# Patient Record
Sex: Female | Born: 1985
Health system: Southern US, Community
[De-identification: ages and names within clinical notes are randomized; demographics above are authoritative.]

## PROBLEM LIST (undated history)

## (undated) DIAGNOSIS — E785 Hyperlipidemia, unspecified: Secondary | ICD-10-CM

## (undated) DIAGNOSIS — R600 Localized edema: Secondary | ICD-10-CM

## (undated) DIAGNOSIS — G43909 Migraine, unspecified, not intractable, without status migrainosus: Secondary | ICD-10-CM

## (undated) DIAGNOSIS — F419 Anxiety disorder, unspecified: Secondary | ICD-10-CM

## (undated) HISTORY — PX: NO PRIOR SURGERIES: 100

## (undated) HISTORY — PX: COLPOSCOPY: SHX161

## (undated) HISTORY — DX: Localized edema: R60.0

## (undated) HISTORY — DX: Hyperlipidemia, unspecified: E78.5

## (undated) HISTORY — DX: Anxiety disorder, unspecified: F41.9

## (undated) HISTORY — DX: Migraine, unspecified, not intractable, without status migrainosus: G43.909

## (undated) DEATH — deceased

---
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
## 2012-11-15 ENCOUNTER — Ambulatory Visit (INDEPENDENT_AMBULATORY_CARE_PROVIDER_SITE_OTHER): Payer: Self-pay | Admitting: Family Medicine

## 2012-11-15 ENCOUNTER — Encounter: Payer: Self-pay | Admitting: Family Medicine

## 2012-11-15 VITALS — BP 120/78 | Temp 98.0°F | Wt 183.0 lb

## 2012-11-15 DIAGNOSIS — G43909 Migraine, unspecified, not intractable, without status migrainosus: Secondary | ICD-10-CM

## 2012-11-15 DIAGNOSIS — Z0000 Encounter for general adult medical examination without abnormal findings: Secondary | ICD-10-CM

## 2012-11-15 MED ORDER — ONDANSETRON 4 MG PO TBDP: 4.0000 mg | ORAL_TABLET | Freq: Four times a day (QID) | ORAL | Status: DC | PRN

## 2012-11-15 MED ORDER — TOPIRAMATE 25 MG PO TABS: 25.0000 mg | ORAL_TABLET | Freq: Every day | ORAL | Status: DC

## 2012-11-15 NOTE — Progress Notes (Signed)
  Subjective:    Patient ID: Alison Davies, female    DOB: 1986-02-01, 27 y.o.   MRN: [id number]  Migraine  This is a recurrent problem. The current episode started in the past 7 days. The problem occurs intermittently. The problem has been rapidly worsening. The pain is located in the right unilateral region. The pain quality is similar to prior headaches. The quality of the pain is described as throbbing. The pain is at a severity of 6/10. The pain is moderate. Associated symptoms include sinus pressure. Pertinent negatives include no blurred vision or coughing. The symptoms are aggravated by emotional stress. She has tried acetaminophen and Excedrin for the symptoms. Her past medical history is significant for migraine headaches.   Headache bad at times .  Not exercising, weekends active   Review of Systems  HENT: Positive for sinus pressure.   Eyes: Negative for blurred vision.  Respiratory: Negative for cough.        Objective:   Physical Exam Alert no acute distress. HEENT normal. Lungs clear. Heart regular rate and rhythm. Neuro exam intact      Assessment & Plan:  Impression migraine headaches. Occurring frequently multiple per week. Patient would like to reintroduce prophylaxis. Imitrex did not help. Excedrin does. Plan initiate Topamax 25 mg each bedtime which was patient's prior dose. Recheck in several months. Diet exercise discussed. Appropriate blood work. WSL

## 2012-11-16 DIAGNOSIS — G43909 Migraine, unspecified, not intractable, without status migrainosus: Secondary | ICD-10-CM | POA: Insufficient documentation

## 2013-02-15 ENCOUNTER — Ambulatory Visit: Payer: Self-pay | Admitting: Family Medicine

## 2014-03-03 ENCOUNTER — Other Ambulatory Visit: Payer: Self-pay | Admitting: Family Medicine

## 2014-03-03 ENCOUNTER — Ambulatory Visit (INDEPENDENT_AMBULATORY_CARE_PROVIDER_SITE_OTHER): Payer: Self-pay | Admitting: Family Medicine

## 2014-03-03 ENCOUNTER — Encounter: Payer: Self-pay | Admitting: Family Medicine

## 2014-03-03 VITALS — BP 128/88 | Ht 65.0 in | Wt 201.0 lb

## 2014-03-03 DIAGNOSIS — Z0000 Encounter for general adult medical examination without abnormal findings: Secondary | ICD-10-CM

## 2014-03-03 DIAGNOSIS — E669 Obesity, unspecified: Secondary | ICD-10-CM

## 2014-03-03 LAB — GLUCOSE, RANDOM: Glucose, Bld: 84 mg/dL (ref 70–99)

## 2014-03-03 LAB — LIPID PANEL
Cholesterol: 240 mg/dL — ABNORMAL HIGH (ref 0–200)
HDL: 47 mg/dL (ref >39)
LDL Cholesterol: 152 mg/dL — ABNORMAL HIGH (ref 0–99)
Total CHOL/HDL Ratio: 5.1 Ratio
Triglycerides: 207 mg/dL — ABNORMAL HIGH (ref <150)
VLDL: 41 mg/dL — ABNORMAL HIGH (ref 0–40)

## 2014-03-03 NOTE — Progress Notes (Signed)
   Subjective:    Patient ID: Alison ChafeDiana M Davies, female    DOB: 09/26/1985, 28 y.o.   MRN: [id number]  HPI Patient is here today for a school physical. She is going [facility] [employer] for [employer]ography.    Pt needs the varicella titer.  Would like to discuss weight loss measures.    Not exercising at all  Diet not so good Working on   Sig obesity in the family    Review of Systems  Constitutional: Negative for activity change, appetite change and fatigue.       Gradual weight gain  HENT: Negative for congestion, ear discharge and rhinorrhea.   Eyes: Negative for discharge.  Respiratory: Negative for cough, chest tightness and wheezing.   Cardiovascular: Negative for chest pain.  Gastrointestinal: Negative for vomiting and abdominal pain.  Genitourinary: Negative for frequency and difficulty urinating.  Musculoskeletal: Negative for neck pain.  Allergic/Immunologic: Negative for environmental allergies and food allergies.  Neurological: Negative for weakness and headaches.  Psychiatric/Behavioral: Negative for behavioral problems and agitation.       Objective:   Physical Exam  Constitutional: She is oriented to person, place, and time. She appears well-developed and well-nourished.  HENT:  Head: Normocephalic.  Right Ear: External ear normal.  Left Ear: External ear normal.  Eyes: Pupils are equal, round, and reactive to light.  Neck: Normal range of motion. No thyromegaly present.  Cardiovascular: Normal rate, regular rhythm, normal heart sounds and intact distal pulses.   No murmur heard. Pulmonary/Chest: Effort normal and breath sounds normal. No respiratory distress. She has no wheezes.  Abdominal: Soft. Bowel sounds are normal. She exhibits no distension and no mass. There is no tenderness.  Musculoskeletal: Normal range of motion. She exhibits no edema or tenderness.  Lymphadenopathy:    She has no cervical adenopathy.  Neurological: She is alert and oriented to person,  place, and time. She exhibits normal muscle tone.  Skin: Skin is warm and dry.  Psychiatric: She has a normal mood and affect. Her behavior is normal.  Vitals reviewed.         Assessment & Plan:  Impression 1 wellness exam #2 obesity discussed plan diet exercise discussed. Varus sella titer. Also recommend lipid and glucose. Diet discussed at length. Exercise also. Form filled out. WS cell

## 2014-03-04 LAB — VARICELLA ZOSTER ANTIBODY, IGG: Varicella IgG: 2864 Index — ABNORMAL HIGH (ref <135.00)

## 2015-12-01 ENCOUNTER — Encounter: Payer: Self-pay | Admitting: Family Medicine

## 2015-12-01 ENCOUNTER — Ambulatory Visit (INDEPENDENT_AMBULATORY_CARE_PROVIDER_SITE_OTHER): Payer: Self-pay | Admitting: Family Medicine

## 2015-12-01 VITALS — BP 128/90 | Temp 98.8°F | Ht 64.0 in | Wt 204.0 lb

## 2015-12-01 DIAGNOSIS — G43009 Migraine without aura, not intractable, without status migrainosus: Secondary | ICD-10-CM

## 2015-12-01 MED ORDER — PROMETHAZINE HCL 25 MG PO TABS: 25.0000 mg | ORAL_TABLET | Freq: Four times a day (QID) | ORAL | 2 refills | Status: DC | PRN

## 2015-12-01 MED ORDER — TOPIRAMATE 50 MG PO TABS: ORAL_TABLET | ORAL | 5 refills | Status: DC

## 2015-12-01 NOTE — Progress Notes (Signed)
   Subjective:    Patient ID: Alison Davies, female    DOB: 24-Feb-1986, 30 y.o.   MRN: [id number]  Headache   This is a new problem. Episode onset: one month. The pain is located in the right unilateral region. The pain is at a severity of 7/10. Associated symptoms include blurred vision, photophobia, scalp tenderness and vomiting. Treatments tried: excedrin migraine. The treatment provided moderate relief.   Pt had rx for topamax but it ran out. Would like to get back on topamax.   Graduating from school this dec  Working as a cna   h a's not as severe in the as in the pas  Photo and phono phobia  Starts as a small type dull pain, then gets worse  exced hjelps someusing topical agents dark room   Having them at work would like to get bk on preventitive agent   Review of Systems  Eyes: Positive for blurred vision and photophobia.  Gastrointestinal: Positive for vomiting.  Neurological: Positive for headaches.       Objective:   Physical Exam Alert vitals stable, NAD. Blood pressure good on repeat. HEENT normal. Lungs clear. Heart regular rate and rhythm.        Assessment & Plan:  Impression recurrence of migraine headaches. Time to reinitiate prophylaxis. Experiencing 5 or 6 per month. Plan initiate Topamax. Phenergan when necessary for nausea WSL

## 2016-05-02 DIAGNOSIS — R87619 Unspecified abnormal cytological findings in specimens from cervix uteri: Secondary | ICD-10-CM

## 2016-05-02 HISTORY — DX: Unspecified abnormal cytological findings in specimens from cervix uteri: R87.619

## 2016-07-13 ENCOUNTER — Ambulatory Visit (INDEPENDENT_AMBULATORY_CARE_PROVIDER_SITE_OTHER): Payer: Self-pay | Admitting: Family Medicine

## 2016-07-13 ENCOUNTER — Encounter: Payer: Self-pay | Admitting: Family Medicine

## 2016-07-13 VITALS — BP 112/74 | Ht 64.0 in | Wt 200.8 lb

## 2016-07-13 DIAGNOSIS — F411 Generalized anxiety disorder: Secondary | ICD-10-CM

## 2016-07-13 MED ORDER — SERTRALINE HCL 50 MG PO TABS: 50.0000 mg | ORAL_TABLET | Freq: Every day | ORAL | 5 refills | Status: DC

## 2016-07-13 NOTE — Progress Notes (Signed)
   Subjective:    Patient ID: Alison Davies, female    DOB: 11/12/1985, 31 y.o.   MRN: [id number]  Anxiety  Presents for initial visit. Symptoms include depressed mood, excessive worry, insomnia and panic. Primary symptoms comment: panic attacks when driving on highway or in social situations.   Treatments tried: zoloft 50mg .   zoloft is helping but still having trouble sleeping. Since starting zoloft has noticed hands shaky off and on.   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        Objective:   Physical Exam  Alert vitals stable, NAD. Blood pressure good on repeat. HEENT normal. Lungs clear. Heart regular rate and rhythm.[email or web address]       Assessment & Plan:  Impression generalized anxiety disorder with elements of insomnia. Maintain Zoloft. Overall handling well exercise encouraged. Initiate melatonin recheck in 6 months

## 2016-12-22 ENCOUNTER — Other Ambulatory Visit: Payer: Self-pay

## 2016-12-22 MED ORDER — TOPIRAMATE 50 MG PO TABS: ORAL_TABLET | ORAL | 3 refills | Status: DC

## 2016-12-22 NOTE — Progress Notes (Unsigned)
Last 07/13/16 for anxiety

## 2016-12-22 NOTE — Progress Notes (Signed)
Ok may ref for three mop total

## 2017-01-13 ENCOUNTER — Ambulatory Visit (INDEPENDENT_AMBULATORY_CARE_PROVIDER_SITE_OTHER): Payer: Self-pay | Admitting: Family Medicine

## 2017-01-13 ENCOUNTER — Encounter: Payer: Self-pay | Admitting: Family Medicine

## 2017-01-13 VITALS — BP 132/84 | Ht 64.0 in | Wt 184.2 lb

## 2017-01-13 DIAGNOSIS — G43009 Migraine without aura, not intractable, without status migrainosus: Secondary | ICD-10-CM

## 2017-01-13 DIAGNOSIS — E669 Obesity, unspecified: Secondary | ICD-10-CM

## 2017-01-13 DIAGNOSIS — F411 Generalized anxiety disorder: Secondary | ICD-10-CM

## 2017-01-13 MED ORDER — SERTRALINE HCL 50 MG PO TABS: 50.0000 mg | ORAL_TABLET | Freq: Every day | ORAL | 5 refills | Status: DC

## 2017-01-13 MED ORDER — TOPIRAMATE 50 MG PO TABS: ORAL_TABLET | ORAL | 3 refills | Status: DC

## 2017-01-13 NOTE — Progress Notes (Signed)
   Subjective:    Patient ID: Alison Davies, female    DOB: February 07, 1986, 31 y.o.   MRN: [id number]  Anxiety  Presents for follow-up visit.     Patient would also like to discuss weight loss.   Patient notes ongoing compliance with antidepressant medication. No obvious side effects. Reports does not miss a dose. Overall continues to help depression substantially. No thoughts of homicide or suicide. Would like to maintain medication.  Also anxiety issues are much better  .headaches, still take s topamax  exdrising ome gym and liitng weights three times per wk   On odd hr shifts still having is sleeping, takes melatonin prn   woring crazy shits an having trouble seeping with it     Review of Systems No headache, no major weight loss or weight gain, no chest pain no back pain abdominal pain no change in bowel habits complete ROS otherwise negative     Objective:   Physical Exam  Alert vitals stable, NAD. Blood pressure good on repeat. HEENT normal. Lungs clear. Heart regular rate and rhythm.       Assessment & Plan:  Impression 1 /anxiety with primarily anxiety good control on current medication. #2 migraine headaches considerably improved non-for a whole year. Compliant with prophylactic medicine. #3 obesity multiple questions answered regarding diet plan refilled generic Zoloft. Attempt to wean off Topamax. Diet exercise discuss follow-up in 6 months

## 2017-01-23 ENCOUNTER — Other Ambulatory Visit: Payer: Self-pay | Admitting: Family Medicine

## 2017-05-18 MED FILL — SERTRALINE HCL 50 MG TABLET: 50 | 30 days supply | Qty: 30 | Fill #0

## 2017-06-02 ENCOUNTER — Ambulatory Visit (INDEPENDENT_AMBULATORY_CARE_PROVIDER_SITE_OTHER): Payer: 59 | Admitting: Family Medicine

## 2017-06-02 ENCOUNTER — Encounter: Payer: Self-pay | Admitting: Family Medicine

## 2017-06-02 VITALS — BP 122/84 | Ht 64.5 in | Wt 197.4 lb

## 2017-06-02 DIAGNOSIS — Z13220 Encounter for screening for lipoid disorders: Secondary | ICD-10-CM

## 2017-06-02 DIAGNOSIS — R5383 Other fatigue: Secondary | ICD-10-CM | POA: Diagnosis not present

## 2017-06-02 DIAGNOSIS — Z79899 Other long term (current) drug therapy: Secondary | ICD-10-CM

## 2017-06-02 DIAGNOSIS — Z0000 Encounter for general adult medical examination without abnormal findings: Secondary | ICD-10-CM | POA: Diagnosis not present

## 2017-06-02 MED ORDER — SERTRALINE HCL 50 MG PO TABS: 50.0000 mg | ORAL_TABLET | Freq: Every day | ORAL | 5 refills | Status: DC

## 2017-06-02 NOTE — Progress Notes (Signed)
   Subjective:    Patient ID: Alison Davies, female    DOB: 11/03/1985, 32 y.o.   MRN: [id number]  HPI  Pt here for physical. Pt states she does not want a pap at this time. Eating healthy and exercising. Stress eats.   Would like to talk about referral to Dr. Dalbert GarnetBeasley.does weight msnagement,   Often stress eat and emotionl eating and feels motivated to turn around  Pt has started to eerise, now full time, and the stress has taken away exercise  Diet so so five out of ten   No recent blood ork done    Takes zoloft, states helps the anxiety still     Uses generic xanax on occasion one or twice per moth  migr headaches overall still good despite taking the topamax off   Working at [employer] [place] [employer]   Review of Systems  Constitutional: Negative for activity change, appetite change and fatigue.  HENT: Negative for congestion and rhinorrhea.   Eyes: Negative for discharge.  Respiratory: Negative for cough, chest tightness and wheezing.   Cardiovascular: Negative for chest pain.  Gastrointestinal: Negative for abdominal pain, blood in stool and vomiting.  Endocrine: Negative for polyphagia.  Genitourinary: Negative for difficulty urinating and frequency.  Musculoskeletal: Negative for neck pain.  Skin: Negative for color change.  Allergic/Immunologic: Negative for environmental allergies and food allergies.  Neurological: Negative for weakness and headaches.  Psychiatric/Behavioral: Negative for agitation and behavioral problems.  All other systems reviewed and are negative.      Objective:   Physical Exam  Constitutional: She is oriented to person, place, and time. She appears well-developed and well-nourished.  HENT:  Head: Normocephalic and atraumatic.  Right Ear: External ear normal.  Left Ear: External ear normal.  Eyes: Right eye exhibits no discharge. Left eye exhibits no discharge.  Neck: Normal range of motion. No tracheal deviation present.   Cardiovascular: Normal rate, regular rhythm, normal heart sounds and intact distal pulses. Exam reveals no gallop.  No murmur heard. Pulmonary/Chest: Effort normal and breath sounds normal. No stridor. No respiratory distress. She has no wheezes. She has no rales.  Abdominal: Soft. Bowel sounds are normal. She exhibits no distension and no mass. There is no tenderness. There is no rebound and no guarding.  Musculoskeletal: Normal range of motion. She exhibits no edema or tenderness.  Lymphadenopathy:    She has no cervical adenopathy.  Neurological: She is alert and oriented to person, place, and time. She exhibits normal muscle tone.  Skin: Skin is warm and dry.  Psychiatric: She has a normal mood and affect. Her behavior is normal.  Vitals reviewed.         Assessment & Plan:  Impression well adult exam.  Diet discussed.  Exercise discussed.  Appropriate blood work discussed in order.  Patient sees GYN regularly for preventive checkups.  Patient is obese BMI of 33.  She would like to be referred to a [place] program for weight under Dr. Dalbert GarnetBeasley further recommendations based on blood work we will work on referral

## 2017-06-03 LAB — HEPATIC FUNCTION PANEL
ALT: 17 IU/L (ref 0–32)
AST: 14 IU/L (ref 0–40)
Albumin: 4.5 g/dL (ref 3.5–5.5)
Alkaline Phosphatase: 66 IU/L (ref 39–117)
Bilirubin Total: 0.4 mg/dL (ref 0.0–1.2)
Bilirubin, Direct: 0.12 mg/dL (ref 0.00–0.40)
Total Protein: 7.3 g/dL (ref 6.0–8.5)

## 2017-06-03 LAB — BASIC METABOLIC PANEL
BUN/Creatinine Ratio: 18 (ref 9–23)
BUN: 11 mg/dL (ref 6–20)
CO2: 21 mmol/L (ref 20–29)
Calcium: 9.6 mg/dL (ref 8.7–10.2)
Chloride: 101 mmol/L (ref 96–106)
Creatinine, Ser: 0.62 mg/dL (ref 0.57–1.00)
GFR calc Af Amer: 140 mL/min/{1.73_m2} (ref >59)
GFR calc non Af Amer: 121 mL/min/{1.73_m2} (ref >59)
Glucose: 99 mg/dL (ref 65–99)
Potassium: 4.4 mmol/L (ref 3.5–5.2)
Sodium: 138 mmol/L (ref 134–144)

## 2017-06-03 LAB — CBC WITH DIFFERENTIAL/PLATELET
Basophils Absolute: 0 10*3/uL (ref 0.0–0.2)
Basos: 0 %
EOS (ABSOLUTE): 0.2 10*3/uL (ref 0.0–0.4)
Eos: 2 %
Hematocrit: 44.3 % (ref 34.0–46.6)
Hemoglobin: 14.5 g/dL (ref 11.1–15.9)
Immature Grans (Abs): 0 10*3/uL (ref 0.0–0.1)
Immature Granulocytes: 0 %
Lymphocytes Absolute: 2.4 10*3/uL (ref 0.7–3.1)
Lymphs: 30 %
MCH: 30.3 pg (ref 26.6–33.0)
MCHC: 32.7 g/dL (ref 31.5–35.7)
MCV: 93 fL (ref 79–97)
Monocytes Absolute: 0.4 10*3/uL (ref 0.1–0.9)
Monocytes: 5 %
Neutrophils Absolute: 5.2 10*3/uL (ref 1.4–7.0)
Neutrophils: 63 %
Platelets: 323 10*3/uL (ref 150–379)
RBC: 4.78 x10E6/uL (ref 3.77–5.28)
RDW: 12.3 % (ref 12.3–15.4)
WBC: 8.2 10*3/uL (ref 3.4–10.8)

## 2017-06-03 LAB — LIPID PANEL
Chol/HDL Ratio: 5 ratio — ABNORMAL HIGH (ref 0.0–4.4)
Cholesterol, Total: 248 mg/dL — ABNORMAL HIGH (ref 100–199)
HDL: 50 mg/dL (ref >39)
LDL Calculated: 169 mg/dL — ABNORMAL HIGH (ref 0–99)
Triglycerides: 144 mg/dL (ref 0–149)
VLDL Cholesterol Cal: 29 mg/dL (ref 5–40)

## 2017-06-05 ENCOUNTER — Encounter: Payer: Self-pay | Admitting: Family Medicine

## 2017-06-08 ENCOUNTER — Encounter: Payer: Self-pay | Admitting: Family Medicine

## 2017-06-15 MED FILL — SERTRALINE HCL 50 MG TABLET: 50 | 30 days supply | Qty: 30 | Fill #1

## 2017-07-20 ENCOUNTER — Encounter (INDEPENDENT_AMBULATORY_CARE_PROVIDER_SITE_OTHER): Payer: 59

## 2017-07-21 ENCOUNTER — Encounter (INDEPENDENT_AMBULATORY_CARE_PROVIDER_SITE_OTHER): Payer: Self-pay | Admitting: Family Medicine

## 2017-07-26 ENCOUNTER — Encounter (INDEPENDENT_AMBULATORY_CARE_PROVIDER_SITE_OTHER): Payer: Self-pay | Admitting: Family Medicine

## 2017-07-26 ENCOUNTER — Ambulatory Visit (INDEPENDENT_AMBULATORY_CARE_PROVIDER_SITE_OTHER): Payer: 59 | Admitting: Family Medicine

## 2017-07-26 VITALS — BP 123/85 | HR 68 | Temp 98.0°F | Ht 63.0 in | Wt 199.0 lb

## 2017-07-26 DIAGNOSIS — E7849 Other hyperlipidemia: Secondary | ICD-10-CM

## 2017-07-26 DIAGNOSIS — R0600 Dyspnea, unspecified: Secondary | ICD-10-CM

## 2017-07-26 DIAGNOSIS — Z0289 Encounter for other administrative examinations: Secondary | ICD-10-CM

## 2017-07-26 DIAGNOSIS — Z9189 Other specified personal risk factors, not elsewhere classified: Secondary | ICD-10-CM

## 2017-07-26 DIAGNOSIS — R5383 Other fatigue: Secondary | ICD-10-CM

## 2017-07-26 DIAGNOSIS — Z6835 Body mass index (BMI) 35.0-35.9, adult: Secondary | ICD-10-CM

## 2017-07-26 DIAGNOSIS — Z1331 Encounter for screening for depression: Secondary | ICD-10-CM | POA: Diagnosis not present

## 2017-07-26 DIAGNOSIS — R0609 Other forms of dyspnea: Secondary | ICD-10-CM

## 2017-07-26 NOTE — Progress Notes (Signed)
.  Office: [phone]  /  Fax: [phone]   HPI:   Chief Complaint: OBESITY  Alison Davies (MR# [id number]) is a 32 y.o. female who presents on 07/26/2017 for obesity evaluation and treatment. Current BMI is Body mass index is 35.25 kg/m.Alison Davies has struggled with obesity for years and has been unsuccessful in either losing weight or maintaining long term weight loss. Alison Davies attended our information session and states she is currently in the action stage of change and ready to dedicate time achieving and maintaining a healthier weight.  Alison Davies was told by co-workers about our clinic.   Alison Davies states her family eats meals together she struggles with family and or coworkers weight loss sabotage her desired weight loss is 59 lbs she started gaining weight 1st year of college her heaviest weight ever was 210 lbs she has significant food cravings issues  she wakes up frquently in the middle of the night to eat she skips meals frequently she is frequently drinking liquids with calories she frequently makes poor food choices she frequently eats larger portions than normal  she struggles with emotional eating    Fatigue Alison Davies feels her energy is lower than it should be. This has worsened with weight gain and has not worsened recently. Alison Davies admits to daytime somnolence and  admits to waking up still tired. Patient is at risk for obstructive sleep apnea. Patent has a history of symptoms of daytime fatigue. Patient generally gets 5 hours of sleep per night, and states they generally have nightime awakenings. Snoring is present. Apneic episodes are not present. Epworth Sleepiness Score is 5.  Dyspnea on exertion Alison Davies notes increasing shortness of breath with exercising and seems to be worsening over time with weight gain. She notes getting out of breath sooner with activity than she used to. This has not gotten worse recently. EKG with significant artifact (appears normal otherwise). Makinsley  denies orthopnea.  Hyperlipidemia Alison Davies has hyperlipidemia and has been trying to improve her cholesterol levels with intensive lifestyle modification including a low saturated fat diet, exercise and weight loss. Her LDL was elevated at last drawn. She was doing Keto diet previously. She denies any chest pain, claudication or myalgias.  At risk for cardiovascular disease Alison Davies is at a higher than average risk for cardiovascular disease due to obesity and hyperlipidemia. She currently denies any chest pain.  Depression Screen Alison Davies's Food and Mood (modified PHQ-9) score was  Depression screen PHQ 2/9 07/26/2017  Decreased Interest 1  Down, Depressed, Hopeless 2  PHQ - 2 Score 3  Altered sleeping 2  Tired, decreased energy 3  Change in appetite 3  Feeling bad or failure about yourself  3  Trouble concentrating 3  Moving slowly or fidgety/restless 0  Suicidal thoughts 0  PHQ-9 Score 17  Difficult doing work/chores Somewhat difficult    ALLERGIES: No Known Allergies  MEDICATIONS: Current Outpatient Medications on File Prior to Visit  Medication Sig Dispense Refill  . ALPRAZolam (XANAX) 0.25 MG tablet Take 0.25 mg by mouth as needed for anxiety.    . Biotin 3 MG TABS Take 2 tablets by mouth daily.    . Cyanocobalamin (VITAMIN B-12 CR) 1500 MCG TBCR Take 2 tablets by mouth 2 (two) times daily.    . Magnesium Citrate 100 MG TABS Take 1 capsule by mouth daily.    . Melatonin 5 MG CHEW Chew 2 capsules by mouth at bedtime as needed.    . Multiple Vitamin (MULTIVITAMIN WITH MINERALS) TABS tablet  Take 1 tablet by mouth daily.    . Norethindrone (LYZA PO) Take by mouth daily.    . Omega-3 Fatty Acids (OMEGA-3 FISH OIL) 500 MG CAPS Take 1 capsule by mouth daily.    . sertraline (ZOLOFT) 50 MG tablet Take 1 tablet (50 mg total) by mouth daily. 30 tablet 5  . vitamin C (ASCORBIC ACID) 250 MG tablet Take 250 mg by mouth daily.    . Zinc 50 MG CAPS Take 1 capsule by mouth daily.     No  current facility-administered medications on file prior to visit.     PAST MEDICAL HISTORY: Past Medical History:  Diagnosis Date  . Anxiety   . Hyperlipidemia   . Leg edema   . Migraines     PAST SURGICAL HISTORY: History reviewed. No pertinent surgical history.  SOCIAL HISTORY: Social History   Tobacco Use  . Smoking status: Never Smoker  . Smokeless tobacco: Never Used  Substance Use Topics  . Alcohol use: Not on file  . Drug use: Not on file    FAMILY HISTORY: Family History  Problem Relation Age of Onset  . Anxiety disorder Mother   . Diabetes Father   . Obesity Father     ROS: Review of Systems  Constitutional: Positive for malaise/fatigue. Negative for weight loss.       + Trouble sleeping  Eyes:       + Wear glasses or contacts + Floaters  Respiratory: Positive for shortness of breath (exertion).   Cardiovascular: Negative for chest pain, orthopnea and claudication.       + Very cold feet or hands (occasionally)  Musculoskeletal: Negative for myalgias.  Skin:       + Hair or nail changes (hair loss)  Psychiatric/Behavioral: Positive for depression. Negative for suicidal ideas. The patient is nervous/anxious.        + Stress    PHYSICAL EXAM: Blood pressure 123/85, pulse 68, temperature 98 F (36.7 C), temperature source Oral, height 5\' 3"  (1.6 m), weight 199 lb (90.3 kg), last menstrual period 05/31/2017, SpO2 98 %. Body mass index is 35.25 kg/m. Physical Exam  Constitutional: She is oriented to person, place, and time. She appears well-developed and well-nourished.  HENT:  Head: Normocephalic and atraumatic.  Nose: Nose normal.  Eyes: EOM are normal. No scleral icterus.  Neck: Normal range of motion. Neck supple. No thyromegaly present.  Cardiovascular: Normal rate and regular rhythm.  Pulmonary/Chest: Effort normal. No respiratory distress.  Abdominal: Soft. There is no tenderness.  + Obesity  Musculoskeletal:  Range of Motion normal in  all 4 extremities Trace edema noted in bilateral lower extremities  Neurological: She is alert and oriented to person, place, and time. Coordination normal.  Skin: Skin is warm and dry.  Psychiatric: She has a normal mood and affect. Her behavior is normal.  Vitals reviewed.   RECENT LABS AND TESTS: BMET    Component Value Date/Time   NA 138 06/02/2017 0945   K 4.4 06/02/2017 0945   CL 101 06/02/2017 0945   CO2 21 06/02/2017 0945   GLUCOSE 99 06/02/2017 0945   GLUCOSE 84 03/03/2014 1208   BUN 11 06/02/2017 0945   CREATININE 0.62 06/02/2017 0945   CALCIUM 9.6 06/02/2017 0945   GFRNONAA 121 06/02/2017 0945   GFRAA 140 06/02/2017 0945   No results found for: HGBA1C No results found for: INSULIN CBC    Component Value Date/Time   WBC 8.2 06/02/2017 0945   RBC 4.78 06/02/2017  0945   HGB 14.5 06/02/2017 0945   HCT 44.3 06/02/2017 0945   PLT 323 06/02/2017 0945   MCV 93 06/02/2017 0945   MCH 30.3 06/02/2017 0945   MCHC 32.7 06/02/2017 0945   RDW 12.3 06/02/2017 0945   LYMPHSABS 2.4 06/02/2017 0945   EOSABS 0.2 06/02/2017 0945   BASOSABS 0.0 06/02/2017 0945   Iron/TIBC/Ferritin/ %Sat No results found for: IRON, TIBC, FERRITIN, IRONPCTSAT Lipid Panel     Component Value Date/Time   CHOL 248 (H) 06/02/2017 0945   TRIG 144 06/02/2017 0945   HDL 50 06/02/2017 0945   CHOLHDL 5.0 (H) 06/02/2017 0945   CHOLHDL 5.1 03/03/2014 1208   VLDL 41 (H) 03/03/2014 1208   LDLCALC 169 (H) 06/02/2017 0945   Hepatic Function Panel     Component Value Date/Time   PROT 7.3 06/02/2017 0945   ALBUMIN 4.5 06/02/2017 0945   AST 14 06/02/2017 0945   ALT 17 06/02/2017 0945   ALKPHOS 66 06/02/2017 0945   BILITOT 0.4 06/02/2017 0945   BILIDIR 0.12 06/02/2017 0945   No results found for: TSH Vitamin D No recent labs  ECG  shows NSR with a rate of 69 BPM INDIRECT CALORIMETER done today shows a VO2 of 279 and a REE of 1943. Her calculated basal metabolic rate is 9147 thus her basal  metabolic rate is better than expected.    ASSESSMENT AND PLAN: Other fatigue - Plan: EKG 12-Lead, Hemoglobin A1c, Insulin, random, Vitamin B12, VITAMIN D 25 Hydroxy (Vit-D Deficiency, Fractures), Folate, T3, T4, free, TSH  Dyspnea on exertion  Other hyperlipidemia - Plan: Lipid Panel With LDL/HDL Ratio  Depression screening  At risk for heart disease  Class 2 severe obesity with serious comorbidity and body mass index (BMI) of 35.0 to 35.9 in adult, unspecified obesity type (HCC)  PLAN:  Fatigue Walda was informed that her fatigue may be related to obesity, depression or many other causes. Labs will be ordered, and in the meanwhile Amanie has agreed to work on diet, exercise and weight loss to help with fatigue. Proper sleep hygiene was discussed including the need for 7-8 hours of quality sleep each night. A sleep study was not ordered based on symptoms and Epworth score.  Dyspnea on exertion Sadye's shortness of breath appears to be obesity related and exercise induced. She has agreed to work on weight loss and gradually increase exercise to treat her exercise induced shortness of breath. If Sapphire follows our instructions and loses weight without improvement of her shortness of breath, we will plan to refer to pulmonology. We will monitor this condition regularly. Ramonica agrees to this plan.  Hyperlipidemia Jewelene was informed of the American Heart Association Guidelines emphasizing intensive lifestyle modifications as the first line treatment for hyperlipidemia. We discussed many lifestyle modifications today in depth, and Savvy will continue to work on decreasing saturated fats such as fatty red meat, butter and many fried foods. She will also increase vegetables and lean protein in her diet and continue to work on exercise and weight loss efforts. We will check labs and Havannah agrees to follow up with our clinic in 2 weeks.  Cardiovascular risk counselling Torrie was given extended (15  minutes) coronary artery disease prevention counseling today. She is 32 y.o. female and has risk factors for heart disease including obesity and hyperlipidemia. We discussed intensive lifestyle modifications today with an emphasis on specific weight loss instructions and strategies. Pt was also informed of the importance of increasing exercise and decreasing  saturated fats to help prevent heart disease.  Depression Screen Romana had a strongly positive depression screening. Depression is commonly associated with obesity and often results in emotional eating behaviors. We will monitor this closely and work on CBT to help improve the non-hunger eating patterns. Referral to Psychology may be required if no improvement is seen as she continues in our clinic.  Obesity Stesha is currently in the action stage of change and her goal is to continue with weight loss efforts She has agreed to follow the Category 3 plan Kizzy has been instructed to work up to a goal of 150 minutes of combined cardio and strengthening exercise per week for weight loss and overall health benefits. We discussed the following Behavioral Modification Strategies today: increasing lean protein intake, decrease eating out, work on meal planning and easy cooking plans, and increase H20 intake  Jeani has agreed to follow up with our clinic in 2 weeks. She was informed of the importance of frequent follow up visits to maximize her success with intensive lifestyle modifications for her multiple health conditions. She was informed we would discuss her lab results at her next visit unless there is a critical issue that needs to be addressed sooner. Ayala agreed to keep her next visit at the agreed upon time to discuss these results.    OBESITY BEHAVIORAL INTERVENTION VISIT  Today's visit was # 1 out of 22.  Starting weight: 199 lbs Starting date: 07/26/17 Today's weight : 199 lbs  Today's date: 07/26/2017 Total lbs lost to date:  0 (Patients must lose 7 lbs in the first 6 months to continue with counseling)   ASK: We discussed the diagnosis of obesity with Arva Chafe today and Erlene agreed to give [name] permission to discuss obesity behavioral modification therapy today.  ASSESS: Keyunna has the diagnosis of obesity and her BMI today is 35.26 Doreene is in the action stage of change   ADVISE: Tyarra was educated on the multiple health risks of obesity as well as the benefit of weight loss to improve her health. She was advised of the need for long term treatment and the importance of lifestyle modifications.  AGREE: Multiple dietary modification options and treatment options were discussed and  Naiomy agreed to the above obesity treatment plan.   I, Burt Knack, am acting as transcriptionist for Debbra Riding, MD   I have reviewed the above documentation for accuracy and completeness, and I agree with the above. - Debbra Riding, MD

## 2017-07-27 LAB — TSH: TSH: 1.85 u[IU]/mL (ref 0.450–4.500)

## 2017-07-27 LAB — LIPID PANEL WITH LDL/HDL RATIO
Cholesterol, Total: 250 mg/dL — ABNORMAL HIGH (ref 100–199)
HDL: 55 mg/dL (ref >39)
LDL Calculated: 163 mg/dL — ABNORMAL HIGH (ref 0–99)
LDl/HDL Ratio: 3 ratio (ref 0.0–3.2)
Triglycerides: 158 mg/dL — ABNORMAL HIGH (ref 0–149)
VLDL Cholesterol Cal: 32 mg/dL (ref 5–40)

## 2017-07-27 LAB — HEMOGLOBIN A1C
Est. average glucose Bld gHb Est-mCnc: 100 mg/dL
Hgb A1c MFr Bld: 5.1 % (ref 4.8–5.6)

## 2017-07-27 LAB — VITAMIN B12: Vitamin B-12: 985 pg/mL (ref 232–1245)

## 2017-07-27 LAB — VITAMIN D 25 HYDROXY (VIT D DEFICIENCY, FRACTURES): Vit D, 25-Hydroxy: 14.7 ng/mL — ABNORMAL LOW (ref 30.0–100.0)

## 2017-07-27 LAB — T4, FREE: Free T4: 1.26 ng/dL (ref 0.82–1.77)

## 2017-07-27 LAB — INSULIN, RANDOM: INSULIN: 14.6 u[IU]/mL (ref 2.6–24.9)

## 2017-07-27 LAB — T3: T3, Total: 111 ng/dL (ref 71–180)

## 2017-07-27 LAB — FOLATE: Folate: >20 ng/mL (ref >3.0)

## 2017-07-27 MED FILL — SERTRALINE HCL 50 MG TABLET: 50 | 30 days supply | Qty: 30 | Fill #0

## 2017-08-03 ENCOUNTER — Encounter (INDEPENDENT_AMBULATORY_CARE_PROVIDER_SITE_OTHER): Payer: Self-pay | Admitting: Family Medicine

## 2017-08-09 ENCOUNTER — Ambulatory Visit (INDEPENDENT_AMBULATORY_CARE_PROVIDER_SITE_OTHER): Payer: 59 | Admitting: Family Medicine

## 2017-08-09 VITALS — BP 101/59 | HR 83 | Temp 98.5°F | Ht 63.0 in | Wt 195.0 lb

## 2017-08-09 DIAGNOSIS — Z6834 Body mass index (BMI) 34.0-34.9, adult: Secondary | ICD-10-CM | POA: Diagnosis not present

## 2017-08-09 DIAGNOSIS — Z9189 Other specified personal risk factors, not elsewhere classified: Secondary | ICD-10-CM | POA: Diagnosis not present

## 2017-08-09 DIAGNOSIS — E8881 Metabolic syndrome: Secondary | ICD-10-CM | POA: Diagnosis not present

## 2017-08-09 DIAGNOSIS — E7849 Other hyperlipidemia: Secondary | ICD-10-CM | POA: Diagnosis not present

## 2017-08-09 DIAGNOSIS — E669 Obesity, unspecified: Secondary | ICD-10-CM

## 2017-08-09 DIAGNOSIS — E559 Vitamin D deficiency, unspecified: Secondary | ICD-10-CM | POA: Diagnosis not present

## 2017-08-09 MED ORDER — VITAMIN D (ERGOCALCIFEROL) 1.25 MG (50000 UNIT) PO CAPS: 50000.0000 [IU] | ORAL_CAPSULE | ORAL | 0 refills | Status: DC

## 2017-08-09 MED FILL — VIT D2 1.25 MG (50,000 UNIT: 1.25 MG | 28 days supply | Qty: 4 | Fill #0

## 2017-08-09 NOTE — Progress Notes (Signed)
Office: [phone]  /  Fax: [phone]   HPI:   Chief Complaint: OBESITY Alison Davies is here to discuss her progress with her obesity treatment plan. She is on the Category 3 plan and is following her eating plan approximately 75 % of the time. She states she is walking for 30 minutes 5 times per week. Alison Davies indulged over the weekend. No hunger except when skipping snacks. Talked with family about having to prepare her own meals.  Her weight is 195 lb (88.5 kg) today and has had a weight loss of 4 pounds over a period of 2 weeks since her last visit. She has lost 4 lbs since starting treatment with [name].  Vitamin D Deficiency Alison Davies has a diagnosis of vitamin D deficiency. She is not on Vit D supplement currently, Vit D level of 14.7 and denies nausea, vomiting or muscle weakness.  At risk for osteopenia and osteoporosis Alison Davies is at higher risk of osteopenia and osteoporosis due to vitamin D deficiency.   Hyperlipidemia Alison Davies has hyperlipidemia and has been trying to improve her cholesterol levels with intensive lifestyle modification including a low saturated fat diet, exercise and weight loss. LDL of 163 and triglycerides of 158. She denies any chest pain, claudication or myalgias.  Insulin Resistance Alison Davies has a diagnosis of insulin resistance based on her elevated fasting insulin level >5. Hgb A1c of 5.1 and insulin of 14.6. Although Alison Davies's blood glucose readings are still under good control, insulin resistance puts her at greater risk of metabolic syndrome and diabetes. She is not taking metformin currently and continues to work on diet and exercise to decrease risk of diabetes.  ALLERGIES: No Known Allergies  MEDICATIONS: Current Outpatient Medications on File Prior to Visit  Medication Sig Dispense Refill  . ALPRAZolam (XANAX) 0.25 MG tablet Take 0.25 mg by mouth as needed for anxiety.    . Biotin 3 MG TABS Take 2 tablets by mouth daily.    . Cyanocobalamin (VITAMIN B-12 CR) 1500  MCG TBCR Take 2 tablets by mouth 2 (two) times daily.    . Magnesium Citrate 100 MG TABS Take 1 capsule by mouth daily.    . Melatonin 5 MG CHEW Chew 2 capsules by mouth at bedtime as needed.    . Multiple Vitamin (MULTIVITAMIN WITH MINERALS) TABS tablet Take 1 tablet by mouth daily.    . Norethindrone (LYZA PO) Take by mouth daily.    . Omega-3 Fatty Acids (OMEGA-3 FISH OIL) 500 MG CAPS Take 1 capsule by mouth daily.    . sertraline (ZOLOFT) 50 MG tablet Take 1 tablet (50 mg total) by mouth daily. 30 tablet 5  . vitamin C (ASCORBIC ACID) 250 MG tablet Take 250 mg by mouth daily.    . Zinc 50 MG CAPS Take 1 capsule by mouth daily.     No current facility-administered medications on file prior to visit.     PAST MEDICAL HISTORY: Past Medical History:  Diagnosis Date  . Anxiety   . Hyperlipidemia   . Leg edema   . Migraines     PAST SURGICAL HISTORY: No past surgical history on file.  SOCIAL HISTORY: Social History   Tobacco Use  . Smoking status: Never Smoker  . Smokeless tobacco: Never Used  Substance Use Topics  . Alcohol use: Not on file  . Drug use: Not on file    FAMILY HISTORY: Family History  Problem Relation Age of Onset  . Anxiety disorder Mother   . Diabetes Father   .  Obesity Father     ROS: Review of Systems  Constitutional: Positive for weight loss.  Cardiovascular: Negative for chest pain and claudication.  Gastrointestinal: Negative for nausea and vomiting.  Musculoskeletal: Negative for myalgias.       Negative muscle weakness    PHYSICAL EXAM: Blood pressure (!) 101/59, pulse 83, temperature 98.5 F (36.9 C), temperature source Oral, height 5\' 3"  (1.6 m), weight 195 lb (88.5 kg), SpO2 97 %. Body mass index is 34.54 kg/m. Physical Exam  Constitutional: She is oriented to person, place, and time. She appears well-developed and well-nourished.  Cardiovascular: Normal rate.  Pulmonary/Chest: Effort normal.  Musculoskeletal: Normal range of  motion.  Neurological: She is oriented to person, place, and time.  Skin: Skin is warm and dry.  Psychiatric: She has a normal mood and affect. Her behavior is normal.  Vitals reviewed.   RECENT LABS AND TESTS: BMET    Component Value Date/Time   NA 138 06/02/2017 0945   K 4.4 06/02/2017 0945   CL 101 06/02/2017 0945   CO2 21 06/02/2017 0945   GLUCOSE 99 06/02/2017 0945   GLUCOSE 84 03/03/2014 1208   BUN 11 06/02/2017 0945   CREATININE 0.62 06/02/2017 0945   CALCIUM 9.6 06/02/2017 0945   GFRNONAA 121 06/02/2017 0945   GFRAA 140 06/02/2017 0945   Lab Results  Component Value Date   HGBA1C 5.1 07/26/2017   Lab Results  Component Value Date   INSULIN 14.6 07/26/2017   CBC    Component Value Date/Time   WBC 8.2 06/02/2017 0945   RBC 4.78 06/02/2017 0945   HGB 14.5 06/02/2017 0945   HCT 44.3 06/02/2017 0945   PLT 323 06/02/2017 0945   MCV 93 06/02/2017 0945   MCH 30.3 06/02/2017 0945   MCHC 32.7 06/02/2017 0945   RDW 12.3 06/02/2017 0945   LYMPHSABS 2.4 06/02/2017 0945   EOSABS 0.2 06/02/2017 0945   BASOSABS 0.0 06/02/2017 0945   Iron/TIBC/Ferritin/ %Sat No results found for: IRON, TIBC, FERRITIN, IRONPCTSAT Lipid Panel     Component Value Date/Time   CHOL 250 (H) 07/26/2017 1127   TRIG 158 (H) 07/26/2017 1127   HDL 55 07/26/2017 1127   CHOLHDL 5.0 (H) 06/02/2017 0945   CHOLHDL 5.1 03/03/2014 1208   VLDL 41 (H) 03/03/2014 1208   LDLCALC 163 (H) 07/26/2017 1127   Hepatic Function Panel     Component Value Date/Time   PROT 7.3 06/02/2017 0945   ALBUMIN 4.5 06/02/2017 0945   AST 14 06/02/2017 0945   ALT 17 06/02/2017 0945   ALKPHOS 66 06/02/2017 0945   BILITOT 0.4 06/02/2017 0945   BILIDIR 0.12 06/02/2017 0945      Component Value Date/Time   TSH 1.850 07/26/2017 1127  Results for Alison ChafeGARCIA, Shaquille M (MRN [id number]) as of 08/10/2017 07:58  Ref. Range 07/26/2017 11:27  Vitamin D, 25-Hydroxy Latest Ref Range: 30.0 - 100.0 ng/mL 14.7 (L)    ASSESSMENT AND  PLAN: Vitamin D deficiency - Plan: Vitamin D, Ergocalciferol, (DRISDOL) 50000 units CAPS capsule  Other hyperlipidemia  Insulin resistance  At risk for osteoporosis  Class 1 obesity with serious comorbidity and body mass index (BMI) of 34.0 to 34.9 in adult, unspecified obesity type  PLAN:  Vitamin D Deficiency Alison Davies was informed that low vitamin D levels contributes to fatigue and are associated with obesity, breast, and colon cancer. Alison Davies agrees to start prescription Vit D @50 ,000 IU every week #4 with no refills. She will follow up for routine testing  of vitamin D, at least 2-3 times per year. She was informed of the risk of over-replacement of vitamin D and agrees to not increase her dose unless she discusses this with [name] first. We will recheck labs in 3 months and Alison Davies agrees to follow up with our clinic in 2 weeks.  At risk for osteopenia and osteoporosis Alison Davies is at risk for osteopenia and osteoporsis due to her vitamin D deficiency. She was encouraged to take her vitamin D and follow her higher calcium diet and increase strengthening exercise to help strengthen her bones and decrease her risk of osteopenia and osteoporosis.  Hyperlipidemia Alison Davies was informed of the American Heart Association Guidelines emphasizing intensive lifestyle modifications as the first line treatment for hyperlipidemia. We discussed many lifestyle modifications today in depth, and Alison Davies will continue to work on decreasing saturated fats such as fatty red meat, butter and many fried foods. She will also increase vegetables and lean protein in her diet and continue to work on exercise and weight loss efforts. We will recheck labs in 3 months and Alison Davies agrees to follow up with our clinic in 2 weeks.  Insulin Resistance Alison Davies will continue to work on weight loss, exercise, and decreasing simple carbohydrates in her diet to help decrease the risk of diabetes. We dicussed metformin including benefits and risks. She  was informed that eating too many simple carbohydrates or too many calories at one sitting increases the likelihood of GI side effects. Arlesia declined metformin for now and prescription was not written today. We will recheck labs in 3 months and Alison Davies agrees to follow up with our clinic in 2 weeks as directed to monitor her progress.  Obesity Alison Davies is currently in the action stage of change. As such, her goal is to continue with weight loss efforts She has agreed to follow the Category 3 plan Alison Davies has been instructed to work up to a goal of 150 minutes of combined cardio and strengthening exercise per week for weight loss and overall health benefits. We discussed the following Behavioral Modification Strategies today: increasing lean protein intake, increasing vegetables, work on meal planning and easy cooking plans, increase H20 intake, and planning for success   Alison Davies has agreed to follow up with our clinic in 2 weeks. She was informed of the importance of frequent follow up visits to maximize her success with intensive lifestyle modifications for her multiple health conditions.   OBESITY BEHAVIORAL INTERVENTION VISIT  Today's visit was # 2 out of 22.  Starting weight: 199 lbs Starting date: 07/26/17 Today's weight : 195 lbs Today's date: 08/09/2017 Total lbs lost to date: 4 (Patients must lose 7 lbs in the first 6 months to continue with counseling)   ASK: We discussed the diagnosis of obesity with Alison Davies today and Alison Davies agreed to give [name] permission to discuss obesity behavioral modification therapy today.  ASSESS: Alison Davies has the diagnosis of obesity and her BMI today is 34.55 Alison Davies is in the action stage of change   ADVISE: Alison Davies was educated on the multiple health risks of obesity as well as the benefit of weight loss to improve her health. She was advised of the need for long term treatment and the importance of lifestyle modifications.  AGREE: Multiple dietary  modification options and treatment options were discussed and  Alison Davies agreed to the above obesity treatment plan.  Trude Mcburney, am acting as transcriptionist for Debbra Riding, MD

## 2017-08-24 ENCOUNTER — Ambulatory Visit (INDEPENDENT_AMBULATORY_CARE_PROVIDER_SITE_OTHER): Payer: 59 | Admitting: Family Medicine

## 2017-08-24 VITALS — BP 102/66 | HR 65 | Temp 97.8°F | Ht 63.0 in | Wt 193.0 lb

## 2017-08-24 DIAGNOSIS — E8881 Metabolic syndrome: Secondary | ICD-10-CM

## 2017-08-24 DIAGNOSIS — E559 Vitamin D deficiency, unspecified: Secondary | ICD-10-CM

## 2017-08-24 DIAGNOSIS — E669 Obesity, unspecified: Secondary | ICD-10-CM

## 2017-08-24 DIAGNOSIS — Z6834 Body mass index (BMI) 34.0-34.9, adult: Secondary | ICD-10-CM | POA: Diagnosis not present

## 2017-08-24 DIAGNOSIS — E88819 Insulin resistance, unspecified: Secondary | ICD-10-CM

## 2017-08-28 NOTE — Progress Notes (Signed)
Office: [phone]  /  Fax: [phone]   HPI:   Chief Complaint: OBESITY Alison Davies is here to discuss her progress with her obesity treatment plan. She is on the Category 3 plan and is following her eating plan approximately 80 % of the time. She states she is walking for 60 minutes 5 times per week. Alison Davies reports not eating snacks like she is supposed to, and is forgetting to eat snacks most days, or she is hungry but it is just too close to a meal. Her weight is 193 lb (87.5 kg) today and has had a weight loss of 2 pounds over a period of 2 weeks since her last visit. She has lost 6 lbs since starting treatment with [name].  Vitamin D deficiency Alison Davies has a diagnosis of vitamin D deficiency. She just started taking vit D four days ago and denies nausea, vomiting or muscle weakness.  Insulin Resistance Alison Davies has a diagnosis of insulin resistance based on her elevated fasting insulin level >5. Although Alison Davies's blood glucose readings are still under good control, insulin resistance puts her at greater risk of metabolic syndrome and diabetes. She is not taking metformin currently and continues to work on diet and exercise to decrease risk of diabetes. Alison Davies denies cravings.  ALLERGIES: No Known Allergies  MEDICATIONS: Current Outpatient Medications on File Prior to Visit  Medication Sig Dispense Refill  . ALPRAZolam (XANAX) 0.25 MG tablet Take 0.25 mg by mouth as needed for anxiety.    . Biotin 3 MG TABS Take 2 tablets by mouth daily.    . Cyanocobalamin (VITAMIN B-12 CR) 1500 MCG TBCR Take 2 tablets by mouth 2 (two) times daily.    . Magnesium Citrate 100 MG TABS Take 1 capsule by mouth daily.    . Melatonin 5 MG CHEW Chew 2 capsules by mouth at bedtime as needed.    . Multiple Vitamin (MULTIVITAMIN WITH MINERALS) TABS tablet Take 1 tablet by mouth daily.    . Norethindrone (LYZA PO) Take by mouth daily.    . Omega-3 Fatty Acids (OMEGA-3 FISH OIL) 500 MG CAPS Take 1 capsule by mouth  daily.    . sertraline (ZOLOFT) 50 MG tablet Take 1 tablet (50 mg total) by mouth daily. 30 tablet 5  . vitamin C (ASCORBIC ACID) 250 MG tablet Take 250 mg by mouth daily.    . Vitamin D, Ergocalciferol, (DRISDOL) 50000 units CAPS capsule Take 1 capsule (50,000 Units total) by mouth every 7 (seven) days. 4 capsule 0  . Zinc 50 MG CAPS Take 1 capsule by mouth daily.     No current facility-administered medications on file prior to visit.     PAST MEDICAL HISTORY: Past Medical History:  Diagnosis Date  . Anxiety   . Hyperlipidemia   . Leg edema   . Migraines     PAST SURGICAL HISTORY: No past surgical history on file.  SOCIAL HISTORY: Social History   Tobacco Use  . Smoking status: Never Smoker  . Smokeless tobacco: Never Used  Substance Use Topics  . Alcohol use: Not on file  . Drug use: Not on file    FAMILY HISTORY: Family History  Problem Relation Age of Onset  . Anxiety disorder Mother   . Diabetes Father   . Obesity Father     ROS: Review of Systems  Constitutional: Positive for weight loss.  Gastrointestinal: Negative for nausea and vomiting.  Musculoskeletal:       Negative for muscle weakness  Endo/Heme/Allergies:  Negative for cravings    PHYSICAL EXAM: Blood pressure 102/66, pulse 65, temperature 97.8 F (36.6 C), temperature source Oral, height  (1.6 m), weight 193 lb (87.5 kg), SpO2 99 %. Body mass index is 34.19 kg/m. Physical Exam  Constitutional: She is oriented to person, place, and time. She appears well-developed and well-nourished.  Cardiovascular: Normal rate.  Pulmonary/Chest: Effort normal.  Musculoskeletal: Normal range of motion.  Neurological: She is oriented to person, place, and time.  Skin: Skin is warm and dry.  Psychiatric: She has a normal mood and affect. Her behavior is normal.  Vitals reviewed.   RECENT LABS AND TESTS: BMET    Component Value Date/Time   NA 138 06/02/2017 0945   K 4.4 06/02/2017 0945    CL 101 06/02/2017 0945   CO2 21 06/02/2017 0945   GLUCOSE 99 06/02/2017 0945   GLUCOSE 84 03/03/2014 1208   BUN 11 06/02/2017 0945   CREATININE 0.62 06/02/2017 0945   CALCIUM 9.6 06/02/2017 0945   GFRNONAA 121 06/02/2017 0945   GFRAA 140 06/02/2017 0945   Lab Results  Component Value Date   HGBA1C 5.1 07/26/2017   Lab Results  Component Value Date   INSULIN 14.6 07/26/2017   CBC    Component Value Date/Time   WBC 8.2 06/02/2017 0945   RBC 4.78 06/02/2017 0945   HGB 14.5 06/02/2017 0945   HCT 44.3 06/02/2017 0945   PLT 323 06/02/2017 0945   MCV 93 06/02/2017 0945   MCH 30.3 06/02/2017 0945   MCHC 32.7 06/02/2017 0945   RDW 12.3 06/02/2017 0945   LYMPHSABS 2.4 06/02/2017 0945   EOSABS 0.2 06/02/2017 0945   BASOSABS 0.0 06/02/2017 0945   Iron/TIBC/Ferritin/ %Sat No results found for: IRON, TIBC, FERRITIN, IRONPCTSAT Lipid Panel     Component Value Date/Time   CHOL 250 (H) 07/26/2017 1127   TRIG 158 (H) 07/26/2017 1127   HDL 55 07/26/2017 1127   CHOLHDL 5.0 (H) 06/02/2017 0945   CHOLHDL 5.1 03/03/2014 1208   VLDL 41 (H) 03/03/2014 1208   LDLCALC 163 (H) 07/26/2017 1127   Hepatic Function Panel     Component Value Date/Time   PROT 7.3 06/02/2017 0945   ALBUMIN 4.5 06/02/2017 0945   AST 14 06/02/2017 0945   ALT 17 06/02/2017 0945   ALKPHOS 66 06/02/2017 0945   BILITOT 0.4 06/02/2017 0945   BILIDIR 0.12 06/02/2017 0945      Component Value Date/Time   TSH 1.850 07/26/2017 1127   Results for Alison Davies (MRN [id number]) as of 08/28/2017 18:15  Ref. Range 07/26/2017 11:27  Vitamin D, 25-Hydroxy Latest Ref Range: 30.0 - 100.0 ng/mL 14.7 (L)   ASSESSMENT AND PLAN: Vitamin D deficiency  Insulin resistance  Class 1 obesity with serious comorbidity and body mass index (BMI) of 34.0 to 34.9 in adult, unspecified obesity type  PLAN:  Vitamin D Deficiency Alison Davies was informed that low vitamin D levels contributes to fatigue and are associated with obesity,  breast, and colon cancer. She agrees to continue to take prescription Vit D ,000 IU every week. We will repeat labs in 2 months and she will follow up for routine testing of vitamin D, at least 2-3 times per year. She was informed of the risk of over-replacement of vitamin D and agrees to not increase her dose unless she discusses this with [name] first.  Insulin Resistance Alison Davies will continue to work on weight loss, exercise, and decreasing simple carbohydrates in her diet to help  decrease the risk of diabetes. She was informed that eating too many simple carbohydrates or too many calories at one sitting increases the likelihood of GI side effects. Alison Davies will continue with the category 3 meal plan and we will recheck labs in 2 months. Alison Davies agreed to follow up with [name] as directed to monitor her progress.  We spent > than 50% of the 15 minute visit on the counseling as documented in the note.  Obesity Alison Davies is currently in the action stage of change. As such, her goal is to continue with weight loss efforts She has agreed to follow the Category 3 plan Alison Davies has been instructed to work up to a goal of 150 minutes of combined cardio and strengthening exercise per week for weight loss and overall health benefits. We discussed the following Behavioral Modification Strategies today: increasing lean protein intake, increasing vegetables and work on meal planning and easy cooking plans  Alison Davies has agreed to follow up with our clinic in 2 weeks. She was informed of the importance of frequent follow up visits to maximize her success with intensive lifestyle modifications for her multiple health conditions.   OBESITY BEHAVIORAL INTERVENTION VISIT  Today's visit was # 3 out of 22.  Starting weight: 199 lbs Starting date: 07/26/17 Today's weight : 193 lbs Today's date: 08/24/2017 Total lbs lost to date: 6 (Patients must lose 7 lbs in the first 6 months to continue with counseling)   ASK: We discussed  the diagnosis of obesity with Alison Davies today and Alison Davies agreed to give [name] permission to discuss obesity behavioral modification therapy today.  ASSESS: Alison Davies has the diagnosis of obesity and her BMI today is 34.2 Alison Davies is in the action stage of change   ADVISE: Alison Davies was educated on the multiple health risks of obesity as well as the benefit of weight loss to improve her health. She was advised of the need for long term treatment and the importance of lifestyle modifications.  AGREE: Multiple dietary modification options and treatment options were discussed and  Alison Davies agreed to the above obesity treatment plan.  I, Nevada Crane, am acting as transcriptionist for Filbert Schilder, MD  I have reviewed the above documentation for accuracy and completeness, and I agree with the above. - Debbra Riding, MD

## 2017-09-12 ENCOUNTER — Ambulatory Visit (INDEPENDENT_AMBULATORY_CARE_PROVIDER_SITE_OTHER): Payer: 59 | Admitting: Family Medicine

## 2017-09-12 VITALS — BP 107/65 | HR 65 | Temp 98.2°F | Ht 63.0 in | Wt 194.0 lb

## 2017-09-12 DIAGNOSIS — Z6834 Body mass index (BMI) 34.0-34.9, adult: Secondary | ICD-10-CM | POA: Diagnosis not present

## 2017-09-12 DIAGNOSIS — E669 Obesity, unspecified: Secondary | ICD-10-CM

## 2017-09-12 DIAGNOSIS — E88819 Insulin resistance, unspecified: Secondary | ICD-10-CM

## 2017-09-12 DIAGNOSIS — E8881 Metabolic syndrome: Secondary | ICD-10-CM

## 2017-09-12 DIAGNOSIS — Z9189 Other specified personal risk factors, not elsewhere classified: Secondary | ICD-10-CM

## 2017-09-12 DIAGNOSIS — E559 Vitamin D deficiency, unspecified: Secondary | ICD-10-CM

## 2017-09-12 MED ORDER — VITAMIN D (ERGOCALCIFEROL) 1.25 MG (50000 UNIT) PO CAPS: 50000.0000 [IU] | ORAL_CAPSULE | ORAL | 0 refills | Status: DC

## 2017-09-12 NOTE — Progress Notes (Signed)
Office: [phone]  /  Fax: [phone]   HPI:   Chief Complaint: OBESITY Alison Davies is here to discuss her progress with her obesity treatment plan. She is on the Category 3 plan and is following her eating plan approximately 60 % of the time. She states she is walking for 30 minutes 5 times per week. Danetra has some life stressors, so struggled with following plan. Planning to go to [facility] for 4 days for girls trip.  Her weight is 194 lb (88 kg) today and has gained 1 pound since her last visit. She has lost 5 lbs since starting treatment with [name].  Vitamin D Deficiency Alison Davies has a diagnosis of vitamin D deficiency. She is currently taking prescription Vit D and denies nausea, vomiting or muscle weakness.  At risk for osteopenia and osteoporosis Alison Davies is at higher risk of osteopenia and osteoporosis due to vitamin D deficiency.   Insulin Resistance Alison Davies has a diagnosis of insulin resistance based on her elevated fasting insulin level >5. Although Alison Davies's blood glucose readings are still under good control, insulin resistance puts her at greater risk of metabolic syndrome and diabetes. She notes carbohydrate cravings for bread and fried foods. She is not taking metformin currently and continues to work on diet and exercise to decrease risk of diabetes.  ALLERGIES: No Known Allergies  MEDICATIONS: Current Outpatient Medications on File Prior to Visit  Medication Sig Dispense Refill  . ALPRAZolam (XANAX) 0.25 MG tablet Take 0.25 mg by mouth as needed for anxiety.    . Biotin 3 MG TABS Take 2 tablets by mouth daily.    . Cyanocobalamin (VITAMIN B-12 CR) 1500 MCG TBCR Take 2 tablets by mouth 2 (two) times daily.    . Magnesium Citrate 100 MG TABS Take 1 capsule by mouth daily.    . Melatonin 5 MG CHEW Chew 2 capsules by mouth at bedtime as needed.    . Multiple Vitamin (MULTIVITAMIN WITH MINERALS) TABS tablet Take 1 tablet by mouth daily.    . Norethindrone (LYZA PO) Take by mouth daily.     . sertraline (ZOLOFT) 50 MG tablet Take 1 tablet (50 mg total) by mouth daily. 30 tablet 5  . vitamin C (ASCORBIC ACID) 250 MG tablet Take 250 mg by mouth daily.    . Zinc 50 MG CAPS Take 1 capsule by mouth daily.     No current facility-administered medications on file prior to visit.     PAST MEDICAL HISTORY: Past Medical History:  Diagnosis Date  . Anxiety   . Hyperlipidemia   . Leg edema   . Migraines     PAST SURGICAL HISTORY: No past surgical history on file.  SOCIAL HISTORY: Social History   Tobacco Use  . Smoking status: Never Smoker  . Smokeless tobacco: Never Used  Substance Use Topics  . Alcohol use: Not on file  . Drug use: Not on file    FAMILY HISTORY: Family History  Problem Relation Age of Onset  . Anxiety disorder Mother   . Diabetes Father   . Obesity Father     ROS: Review of Systems  Constitutional: Negative for weight loss.  Gastrointestinal: Negative for nausea and vomiting.  Musculoskeletal:       Negative muscle weakness    PHYSICAL EXAM: Blood pressure 107/65, pulse 65, temperature 98.2 F (36.8 C), temperature source Oral, height  (1.6 m), weight 194 lb (88 kg), SpO2 97 %. Body mass index is 34.37 kg/m. Physical Exam  Constitutional:  She is oriented to person, place, and time. She appears well-developed and well-nourished.  Cardiovascular: Normal rate.  Pulmonary/Chest: Effort normal.  Musculoskeletal: Normal range of motion.  Neurological: She is oriented to person, place, and time.  Skin: Skin is warm and dry.  Psychiatric: She has a normal mood and affect. Her behavior is normal.  Vitals reviewed.   RECENT LABS AND TESTS: BMET    Component Value Date/Time   NA 138 06/02/2017 0945   K 4.4 06/02/2017 0945   CL 101 06/02/2017 0945   CO2 21 06/02/2017 0945   GLUCOSE 99 06/02/2017 0945   GLUCOSE 84 03/03/2014 1208   BUN 11 06/02/2017 0945   CREATININE 0.62 06/02/2017 0945   CALCIUM 9.6 06/02/2017 0945    GFRNONAA 121 06/02/2017 0945   GFRAA 140 06/02/2017 0945   Lab Results  Component Value Date   HGBA1C 5.1 07/26/2017   Lab Results  Component Value Date   INSULIN 14.6 07/26/2017   CBC    Component Value Date/Time   WBC 8.2 06/02/2017 0945   RBC 4.78 06/02/2017 0945   HGB 14.5 06/02/2017 0945   HCT 44.3 06/02/2017 0945   PLT 323 06/02/2017 0945   MCV 93 06/02/2017 0945   MCH 30.3 06/02/2017 0945   MCHC 32.7 06/02/2017 0945   RDW 12.3 06/02/2017 0945   LYMPHSABS 2.4 06/02/2017 0945   EOSABS 0.2 06/02/2017 0945   BASOSABS 0.0 06/02/2017 0945   Iron/TIBC/Ferritin/ %Sat No results found for: IRON, TIBC, FERRITIN, IRONPCTSAT Lipid Panel     Component Value Date/Time   CHOL 250 (H) 07/26/2017 1127   TRIG 158 (H) 07/26/2017 1127   HDL 55 07/26/2017 1127   CHOLHDL 5.0 (H) 06/02/2017 0945   CHOLHDL 5.1 03/03/2014 1208   VLDL 41 (H) 03/03/2014 1208   LDLCALC 163 (H) 07/26/2017 1127   Hepatic Function Panel     Component Value Date/Time   PROT 7.3 06/02/2017 0945   ALBUMIN 4.5 06/02/2017 0945   AST 14 06/02/2017 0945   ALT 17 06/02/2017 0945   ALKPHOS 66 06/02/2017 0945   BILITOT 0.4 06/02/2017 0945   BILIDIR 0.12 06/02/2017 0945      Component Value Date/Time   TSH 1.850 07/26/2017 1127  Results for LENIYA, BREIT (MRN [id number]) as of 09/12/2017 11:29  Ref. Range 07/26/2017 11:27  Vitamin D, 25-Hydroxy Latest Ref Range: 30.0 - 100.0 ng/mL 14.7 (L)    ASSESSMENT AND PLAN: Vitamin D deficiency - Plan: Vitamin D, Ergocalciferol, (DRISDOL) 50000 units CAPS capsule  Insulin resistance  At risk for osteoporosis  Class 1 obesity with serious comorbidity and body mass index (BMI) of 34.0 to 34.9 in adult, unspecified obesity type  PLAN:  Vitamin D Deficiency Alison Davies was informed that low vitamin D levels contributes to fatigue and are associated with obesity, breast, and colon cancer. Alison Davies agrees to continue taking prescription Vit D ,000 IU every week #4 and we  will refill for 1 month. She will follow up for routine testing of vitamin D, at least 2-3 times per year. She was informed of the risk of over-replacement of vitamin D and agrees to not increase her dose unless she discusses this with [name] first. Kayliee agrees to follow up with our clinic in 2 weeks.  At risk for osteopenia and osteoporosis Alison Davies is at risk for osteopenia and osteoporsis due to her vitamin D deficiency. She was encouraged to take her vitamin D and follow her higher calcium diet and increase strengthening exercise to  help strengthen her bones and decrease her risk of osteopenia and osteoporosis.  Insulin Resistance Alison Davies will continue to work on weight loss, exercise, and decreasing simple carbohydrates in her diet to help decrease the risk of diabetes. We dicussed metformin including benefits and risks. She was informed that eating too many simple carbohydrates or too many calories at one sitting increases the likelihood of GI side effects. Alison Davies declined metformin for now and prescription was not written today. She is journaling for the next 2 weeks, we will repeat labs in 1 and a half months. Alison Davies agrees to follow up with our clinic in 2 weeks as directed to monitor her progress.  Obesity Alison Davies is currently in the action stage of change. As such, her goal is to continue with weight loss efforts She has agreed to keep a food journal with 1450-1600 calories and 90 grams of protein daily Alison Davies has been instructed to work up to a goal of 150 minutes of combined cardio and strengthening exercise per week for weight loss and overall health benefits. We discussed the following Behavioral Modification Strategies today: increasing lean protein intake, increasing vegetables, work on meal planning and easy cooking plans, travel eating strategies, and planning for success    Alison Davies has agreed to follow up with our clinic in 2 weeks. She was informed of the importance of frequent follow up visits  to maximize her success with intensive lifestyle modifications for her multiple health conditions.   OBESITY BEHAVIORAL INTERVENTION VISIT  Today's visit was # 4 out of 22.  Starting weight: 199 lbs Starting date: 07/26/17 Today's weight : 194 lbs  Today's date: 09/12/2017 Total lbs lost to date: 5 (Patients must lose 7 lbs in the first 6 months to continue with counseling)   ASK: We discussed the diagnosis of obesity with Alison Davies today and Alison Davies agreed to give [name] permission to discuss obesity behavioral modification therapy today.  ASSESS: Alison Davies has the diagnosis of obesity and her BMI today is 34.37 Alison Davies is in the action stage of change   ADVISE: Alison Davies was educated on the multiple health risks of obesity as well as the benefit of weight loss to improve her health. She was advised of the need for long term treatment and the importance of lifestyle modifications.  AGREE: Multiple dietary modification options and treatment options were discussed and  Alison Davies agreed to the above obesity treatment plan.  I, Burt Knack, am acting as transcriptionist for Debbra Riding, MD  I have reviewed the above documentation for accuracy and completeness, and I agree with the above. - Debbra Riding, MD

## 2017-09-19 MED FILL — VIT D2 1.25 MG (50,000 UNIT: 1.25 MG | 28 days supply | Qty: 4 | Fill #0

## 2017-09-20 MED FILL — SERTRALINE HCL 50 MG TABLET: 50 | 30 days supply | Qty: 30 | Fill #1

## 2017-09-26 ENCOUNTER — Encounter: Payer: Self-pay | Admitting: Family Medicine

## 2017-09-28 MED ORDER — TOPIRAMATE 50 MG PO TABS: 50.0000 mg | ORAL_TABLET | Freq: Two times a day (BID) | ORAL | 2 refills | Status: DC

## 2017-09-28 MED ORDER — NORETHINDRONE 0.35 MG PO TABS: 1.0000 | ORAL_TABLET | Freq: Every day | ORAL | 11 refills | Status: DC

## 2017-09-28 MED FILL — TOPIRAMATE 50 MG TABLET: 50 | 30 days supply | Qty: 60 | Fill #0

## 2017-09-28 MED FILL — DEBLITANE 0.35 MG TABS: 0.35 | 84 days supply | Qty: 84 | Fill #0

## 2017-10-03 ENCOUNTER — Ambulatory Visit (INDEPENDENT_AMBULATORY_CARE_PROVIDER_SITE_OTHER): Payer: 59 | Admitting: Family Medicine

## 2017-10-03 ENCOUNTER — Ambulatory Visit (INDEPENDENT_AMBULATORY_CARE_PROVIDER_SITE_OTHER): Payer: 59

## 2017-10-03 VITALS — BP 109/75 | HR 68 | Temp 98.1°F | Ht 63.0 in | Wt 196.0 lb

## 2017-10-03 DIAGNOSIS — E669 Obesity, unspecified: Secondary | ICD-10-CM

## 2017-10-03 DIAGNOSIS — Z6834 Body mass index (BMI) 34.0-34.9, adult: Secondary | ICD-10-CM

## 2017-10-03 DIAGNOSIS — E559 Vitamin D deficiency, unspecified: Secondary | ICD-10-CM

## 2017-10-03 DIAGNOSIS — G43709 Chronic migraine without aura, not intractable, without status migrainosus: Secondary | ICD-10-CM | POA: Diagnosis not present

## 2017-10-03 DIAGNOSIS — R112 Nausea with vomiting, unspecified: Secondary | ICD-10-CM | POA: Diagnosis not present

## 2017-10-03 MED ORDER — PROMETHAZINE HCL 25 MG/ML IJ SOLN
25.0000 mg | Freq: Once | INTRAMUSCULAR | Status: AC
Start: 2017-10-03 — End: 2017-10-03
  Administered 2017-10-03: 25 mg via INTRAMUSCULAR

## 2017-10-03 NOTE — Progress Notes (Signed)
Office: [phone]  /  Fax: [phone]   HPI:   Chief Complaint: OBESITY Alison Davies is here to discuss her progress with her obesity treatment plan. She is on the Category 3 plan and is following her eating plan approximately 30 % of the time. She states she is walking for 120 minutes 3-4 times per week. Alison Davies is moving in a couple of weeks. Went on vacation and had a hard time coming back and getting back on track.  Her weight is 196 lb (88.9 kg) today and has gained 2 pounds since her last visit. She has lost 3 lbs since starting treatment with us.  Vitamin D Deficiency Alison Davies has a diagnosis of vitamin D deficiency. She is currently taking prescription Vit D. She notes fatigue and denies nausea, vomiting or muscle weakness.  Migraines Alison Davies has noticed an increase in migraines and has restarted topiramate.  ALLERGIES: No Known Allergies  MEDICATIONS: Current Outpatient Medications on File Prior to Visit  Medication Sig Dispense Refill  . ALPRAZolam (XANAX) 0.25 MG tablet Take 0.25 mg by mouth as needed for anxiety.    . Biotin 3 MG TABS Take 2 tablets by mouth daily.    . Cyanocobalamin (VITAMIN B-12 CR) 1500 MCG TBCR Take 2 tablets by mouth 2 (two) times daily.    . Melatonin 5 MG CHEW Chew 2 capsules by mouth at bedtime as needed.    . Multiple Vitamin (MULTIVITAMIN WITH MINERALS) TABS tablet Take 1 tablet by mouth daily.    . norethindrone (MICRONOR,CAMILA,ERRIN) 0.35 MG tablet Take 1 tablet (0.35 mg total) by mouth daily. 1 Package 11  . sertraline (ZOLOFT) 50 MG tablet Take 1 tablet (50 mg total) by mouth daily. 30 tablet 5  . topiramate (TOPAMAX) 50 MG tablet Take 1 tablet (50 mg total) by mouth 2 (two) times daily. 60 tablet 2  . vitamin C (ASCORBIC ACID) 250 MG tablet Take 250 mg by mouth daily.    . Vitamin D, Ergocalciferol, (DRISDOL) 50000 units CAPS capsule Take 1 capsule (50,000 Units total) by mouth every 7 (seven) days. 4 capsule 0   No current  facility-administered medications on file prior to visit.     PAST MEDICAL HISTORY: Past Medical History:  Diagnosis Date  . Anxiety   . Hyperlipidemia   . Leg edema   . Migraines     PAST SURGICAL HISTORY: No past surgical history on file.  SOCIAL HISTORY: Social History   Tobacco Use  . Smoking status: Never Smoker  . Smokeless tobacco: Never Used  Substance Use Topics  . Alcohol use: Not on file  . Drug use: Not on file    FAMILY HISTORY: Family History  Problem Relation Age of Onset  . Anxiety disorder Mother   . Diabetes Father   . Obesity Father     ROS: Review of Systems  Constitutional: Negative for weight loss.  Gastrointestinal: Negative for nausea and vomiting.  Musculoskeletal:       Negative muscle weakness  Neurological: Positive for headaches.    PHYSICAL EXAM: Blood pressure 109/75, pulse 68, temperature 98.1 F (36.7 C), temperature source Oral, height 5\' 3"  (1.6 m), weight 196 lb (88.9 kg), SpO2 98 %. Body mass index is 34.72 kg/m. Physical Exam  Constitutional: She is oriented to person, place, and time. She appears well-developed and well-nourished.  Cardiovascular: Normal rate.  Pulmonary/Chest: Effort normal.  Musculoskeletal: Normal range of motion.  Neurological: She is oriented to person, place, and time.  Skin: Skin is  warm and dry.  Psychiatric: She has a normal mood and affect. Her behavior is normal.  Vitals reviewed.   RECENT LABS AND TESTS: BMET    Component Value Date/Time   NA 138 06/02/2017 0945   K 4.4 06/02/2017 0945   CL 101 06/02/2017 0945   CO2 21 06/02/2017 0945   GLUCOSE 99 06/02/2017 0945   GLUCOSE 84 03/03/2014 1208   BUN 11 06/02/2017 0945   CREATININE 0.62 06/02/2017 0945   CALCIUM 9.6 06/02/2017 0945   GFRNONAA 121 06/02/2017 0945   GFRAA 140 06/02/2017 0945   Lab Results  Component Value Date   HGBA1C 5.1 07/26/2017   Lab Results  Component Value Date   INSULIN 14.6 07/26/2017   CBC      Component Value Date/Time   WBC 8.2 06/02/2017 0945   RBC 4.78 06/02/2017 0945   HGB 14.5 06/02/2017 0945   HCT 44.3 06/02/2017 0945   PLT 323 06/02/2017 0945   MCV 93 06/02/2017 0945   MCH 30.3 06/02/2017 0945   MCHC 32.7 06/02/2017 0945   RDW 12.3 06/02/2017 0945   LYMPHSABS 2.4 06/02/2017 0945   EOSABS 0.2 06/02/2017 0945   BASOSABS 0.0 06/02/2017 0945   Iron/TIBC/Ferritin/ %Sat No results found for: IRON, TIBC, FERRITIN, IRONPCTSAT Lipid Panel     Component Value Date/Time   CHOL 250 (H) 07/26/2017 1127   TRIG 158 (H) 07/26/2017 1127   HDL 55 07/26/2017 1127   CHOLHDL 5.0 (H) 06/02/2017 0945   CHOLHDL 5.1 03/03/2014 1208   VLDL 41 (H) 03/03/2014 1208   LDLCALC 163 (H) 07/26/2017 1127   Hepatic Function Panel     Component Value Date/Time   PROT 7.3 06/02/2017 0945   ALBUMIN 4.5 06/02/2017 0945   AST 14 06/02/2017 0945   ALT 17 06/02/2017 0945   ALKPHOS 66 06/02/2017 0945   BILITOT 0.4 06/02/2017 0945   BILIDIR 0.12 06/02/2017 0945      Component Value Date/Time   TSH 1.850 07/26/2017 1127  Results for DOLL, FRAZEE (MRN [id number]) as of 10/03/2017 08:59  Ref. Range 07/26/2017 11:27  Vitamin D, 25-Hydroxy Latest Ref Range: 30.0 - 100.0 ng/mL 14.7 (L)    ASSESSMENT AND PLAN: Vitamin D deficiency  Chronic migraine without aura without status migrainosus, not intractable  Class 1 obesity with serious comorbidity and body mass index (BMI) of 34.0 to 34.9 in adult, unspecified obesity type  PLAN:  Vitamin D Deficiency Shaleigh was informed that low vitamin D levels contributes to fatigue and are associated with obesity, breast, and colon cancer. Karesha agrees to continue taking prescription Vit D @50 ,000 IU every week and will follow up for routine testing of vitamin D, at least 2-3 times per year. She was informed of the risk of over-replacement of vitamin D and agrees to not increase her dose unless she discusses this with [name] first. Cricket agrees to follow up with  our clinic in 2 weeks.  Migraines Ellamae is to increase water content intake and limit salt and sugar intake. Hokulani agrees to follow up with our clinic in 2 weeks.  We spent > than 50% of the 15 minute visit on the counseling as documented in the note.  Obesity Mariska is currently in the action stage of change. As such, her goal is to continue with weight loss efforts She has agreed to follow the Category 3 plan Conya has been instructed to work up to a goal of 150 minutes of combined cardio and strengthening exercise per  week for weight loss and overall health benefits. We discussed the following Behavioral Modification Strategies today: increasing lean protein intake, increasing vegetables, work on meal planning and easy cooking plans, and planning for success   [place] has agreed to follow up with our clinic in 2 weeks. She was informed of the importance of frequent follow up visits to maximize her success with intensive lifestyle modifications for her multiple health conditions.   OBESITY BEHAVIORAL INTERVENTION VISIT  Today's visit was # 5 out of 22.  Starting weight: 199 lbs Starting date: 07/26/17 Today's weight : 196 lbs  Today's date: 10/03/2017 Total lbs lost to date: 3 (Patients must lose 7 lbs in the first 6 months to continue with counseling)   ASK: We discussed the diagnosis of obesity with Arva Chafe today and Kaysen agreed to give [name] permission to discuss obesity behavioral modification therapy today.  ASSESS: Aiyonna has the diagnosis of obesity and her BMI today is 34.73 Ladina is in the action stage of change   ADVISE: Mily was educated on the multiple health risks of obesity as well as the benefit of weight loss to improve her health. She was advised of the need for long term treatment and the importance of lifestyle modifications.  AGREE: Multiple dietary modification options and treatment options were discussed and  Dorna agreed to the above obesity treatment  plan.  I, Burt Knack, am acting as transcriptionist for Debbra Riding, MD  I have reviewed the above documentation for accuracy and completeness, and I agree with the above. - Debbra Riding, MD

## 2017-10-03 NOTE — Progress Notes (Signed)
Verbal order given by Dr. Dayton MartesAron to administer Phenergan 25mg  561mL/given in LUQ/thx dmf

## 2017-10-03 NOTE — Progress Notes (Deleted)
2 excedrin tablets, around 12:15 pm, n/v,

## 2017-10-05 ENCOUNTER — Ambulatory Visit (INDEPENDENT_AMBULATORY_CARE_PROVIDER_SITE_OTHER): Payer: 59 | Admitting: Family Medicine

## 2017-10-05 ENCOUNTER — Encounter: Payer: Self-pay | Admitting: Family Medicine

## 2017-10-05 VITALS — BP 124/84 | HR 74 | Temp 98.2°F | Ht 63.0 in | Wt 201.8 lb

## 2017-10-05 DIAGNOSIS — F411 Generalized anxiety disorder: Secondary | ICD-10-CM | POA: Insufficient documentation

## 2017-10-05 DIAGNOSIS — G43709 Chronic migraine without aura, not intractable, without status migrainosus: Secondary | ICD-10-CM | POA: Diagnosis not present

## 2017-10-05 DIAGNOSIS — E785 Hyperlipidemia, unspecified: Secondary | ICD-10-CM | POA: Insufficient documentation

## 2017-10-05 MED ORDER — SUMATRIPTAN SUCCINATE 25 MG PO TABS: 25.0000 mg | ORAL_TABLET | ORAL | 2 refills | Status: DC | PRN

## 2017-10-05 MED ORDER — TOPIRAMATE 50 MG PO TABS: ORAL_TABLET | ORAL | 2 refills | Status: DC

## 2017-10-05 MED FILL — SUMATRIPTAN SUCC 25 MG TAB: 25 | 30 days supply | Qty: 9 | Fill #0

## 2017-10-05 NOTE — Progress Notes (Signed)
Subjective:   Patient ID: Alison Davies, female    DOB: January 02, 1986, 32 y.o.   MRN: [id number]  MANJIT BUFANO is a pleasant 32 y.o. year old female who presents to clinic today with Migraine (Patient is here today to discuss Migraine prevention.  She has been having migraines daily 2-3 x daily? Either it goes away after 2-4 hours then another one comes.  Sometimes will wake in the middle of the night with one.  Also has N&V, light & smell sensitivity, and in the past has seen sparks in peripheral.  She takes Topiramate 50mg  and only takes 1qhs although it states bid as it causes her to feel sleepy. Does not have anything for migraines except Excedrine.  GYN visit scheduled for 6.25.19.)  on 10/05/2017  HPI:  Migraines-  Diagnosed when she was 20.  Initially started on topamax- initially worked well- was only having 3-4 per month.  Just restarted Topamax 50 mg a week ago.  Imitrex or excedrin was used as abortive.   Worse in summer.  She has never kept a headache journal- unaware of any other triggers.  About a month ago, having 2 migraines per day.   Prior to that, had been 2 years since she had a migraine.  Migraines are always right sided, associated with nausea, vomiting, photophobia. Has not had imitrex so has been taking NSAIDs (excedrin or ibuprofen ) for abortive therapy, goes into a cool dark room.  Has never seen a neurologist.   GAD- started zoloft about a year ago.  Feels this is working well.  Rarely needs to take xanax.  HLD- followed by Dr. Dalbert Garnet.  She is seeing her again in a few months to have this rechecked. She has changed her diet significantly. Lab Results  Component Value Date   CHOL 250 (H) 07/26/2017   HDL 55 07/26/2017   LDLCALC 163 (H) 07/26/2017   TRIG 158 (H) 07/26/2017   CHOLHDL 5.0 (H) 06/02/2017     Current Outpatient Medications on File Prior to Visit  Medication Sig Dispense Refill  . ALPRAZolam (XANAX) 0.25 MG tablet Take 0.25 mg by mouth as  needed for anxiety.    . Biotin 3 MG TABS Take 2 tablets by mouth daily.    . Cyanocobalamin (VITAMIN B-12 CR) 1500 MCG TBCR Take 2 tablets by mouth 2 (two) times daily.    . Melatonin 5 MG CHEW Chew 2 capsules by mouth at bedtime as needed.    . Multiple Vitamin (MULTIVITAMIN WITH MINERALS) TABS tablet Take 1 tablet by mouth daily.    . norethindrone (MICRONOR,CAMILA,ERRIN) 0.35 MG tablet Take 1 tablet (0.35 mg total) by mouth daily. 1 Package 11  . sertraline (ZOLOFT) 50 MG tablet Take 1 tablet (50 mg total) by mouth daily. 30 tablet 5  . vitamin C (ASCORBIC ACID) 250 MG tablet Take 250 mg by mouth daily.    . Vitamin D, Ergocalciferol, (DRISDOL) 50000 units CAPS capsule Take 1 capsule (50,000 Units total) by mouth every 7 (seven) days. 4 capsule 0   No current facility-administered medications on file prior to visit.     No Known Allergies  Past Medical History:  Diagnosis Date  . Anxiety   . Hyperlipidemia   . Leg edema   . Migraines     No past surgical history on file.  Family History  Problem Relation Age of Onset  . Anxiety disorder Mother   . Diabetes Father   . Obesity Father  Social History   Socioeconomic History  . Marital status: Single    Spouse name: Not on file  . Number of children: Not on file  . Years of education: Not on file  . Highest education level: Not on file  Occupational History  . Occupation: [employer]:   Social Needs  . Financial resource strain: Not on file  . Food insecurity:    Worry: Not on file    Inability: Not on file  . Transportation needs:    Medical: Not on file    Non-medical: Not on file  Tobacco Use  . Smoking status: Never Smoker  . Smokeless tobacco: Never Used  Substance and Sexual Activity  . Alcohol use: Not on file  . Drug use: Not on file  . Sexual activity: Not on file  Lifestyle  . Physical activity:    Days per week: Not on file    Minutes per session: Not on file  . Stress:  Not on file  Relationships  . Social connections:    Talks on phone: Not on file    Gets together: Not on file    Attends religious service: Not on file    Active member of club or organization: Not on file    Attends meetings of clubs or organizations: Not on file    Relationship status: Not on file  . Intimate partner violence:    Fear of current or ex partner: Not on file    Emotionally abused: Not on file    Physically abused: Not on file    Forced sexual activity: Not on file  Other Topics Concern  . Not on file  Social History Narrative  . Not on file   The PMH, PSH, Social History, Family History, Medications, and allergies have been reviewed in [facility], and have been updated if relevant.   Review of Systems  Constitutional: Negative.   Eyes: Positive for photophobia.  Gastrointestinal: Positive for nausea and vomiting. Negative for abdominal distention, abdominal pain, anal bleeding, blood in stool, constipation, diarrhea and rectal pain.  Musculoskeletal: Negative.   Neurological: Positive for headaches. Negative for dizziness, tremors, seizures, syncope, facial asymmetry, speech difficulty, weakness, light-headedness and numbness.  Hematological: Negative.   Psychiatric/Behavioral: Negative.   All other systems reviewed and are negative.      Objective:    BP 124/84 (BP Location: Left Arm, Patient Position: Sitting, Cuff Size: Normal)   Pulse 74   Temp 98.2 F (36.8 C) (Oral)   Ht 5\' 3"  (1.6 m)   Wt 201 lb 12.8 oz (91.5 kg)   SpO2 97%   BMI 35.75 kg/m    Physical Exam  Constitutional: She is oriented to person, place, and time. She appears well-developed and well-nourished. No distress.  HENT:  Head: Normocephalic and atraumatic.  Eyes: EOM are normal.  Neck: Normal range of motion.  Cardiovascular: Normal rate.  Pulmonary/Chest: Effort normal.  Musculoskeletal: Normal range of motion. She exhibits no edema.  Neurological: She is alert and oriented to  person, place, and time. She displays normal reflexes. No cranial nerve deficit. Coordination normal.  Skin: Skin is warm and dry. She is not diaphoretic.  Psychiatric: She has a normal mood and affect. Her behavior is normal. Judgment and thought content normal.  Nursing note and vitals reviewed.         Assessment & Plan:   Chronic migraine without aura without status migrainosus, not intractable  Hyperlipidemia, unspecified hyperlipidemia type  GAD (generalized anxiety disorder) No follow-ups on file.

## 2017-10-05 NOTE — Assessment & Plan Note (Signed)
Followed by Dr. Dalbert GarnetBeasley. She has been working on controlling this with diet.

## 2017-10-05 NOTE — Patient Instructions (Signed)
Great to see you.  We are restarting topamax 50- 100 mg nightly.  Imitrex as needed. Keep a headache journal for me.   Come see me in 1-2 months.

## 2017-10-05 NOTE — Assessment & Plan Note (Signed)
Well controlled with zoloft, rarely used xanax. No changes made to rxs.

## 2017-10-05 NOTE — Assessment & Plan Note (Signed)
Deteriorated. >25 minutes spent in face to face time with patient, >50% spent in counselling or coordination of care Increase topamax to 5-100 mg nightly. Imitrex as needed for abortive therapy. STOP NSAIDs. Keep a headache journal. Follow up in 1-2 months.

## 2017-10-19 ENCOUNTER — Other Ambulatory Visit (INDEPENDENT_AMBULATORY_CARE_PROVIDER_SITE_OTHER): Payer: 59

## 2017-10-19 ENCOUNTER — Other Ambulatory Visit: Payer: Self-pay | Admitting: Family Medicine

## 2017-10-19 DIAGNOSIS — Z0184 Encounter for antibody response examination: Secondary | ICD-10-CM | POA: Diagnosis not present

## 2017-10-19 DIAGNOSIS — Z111 Encounter for screening for respiratory tuberculosis: Secondary | ICD-10-CM

## 2017-10-19 NOTE — Progress Notes (Signed)
quant

## 2017-10-21 LAB — MEASLES/MUMPS/RUBELLA IMMUNITY
Mumps IgG: 54 AU/mL
Rubella: 4.17 index
Rubeola IgG: >300 AU/mL

## 2017-10-21 LAB — QUANTIFERON-TB GOLD PLUS
Mitogen-NIL: 7.58 IU/mL
NIL: 0.09 IU/mL
QuantiFERON-TB Gold Plus: NEGATIVE
TB1-NIL: 0.01 IU/mL
TB2-NIL: 0 IU/mL

## 2017-10-23 ENCOUNTER — Other Ambulatory Visit (INDEPENDENT_AMBULATORY_CARE_PROVIDER_SITE_OTHER): Payer: Self-pay | Admitting: Family Medicine

## 2017-10-23 DIAGNOSIS — E559 Vitamin D deficiency, unspecified: Secondary | ICD-10-CM

## 2017-10-23 MED FILL — SERTRALINE HCL 50 MG TABLET: 50 | 30 days supply | Qty: 30 | Fill #2

## 2017-10-24 ENCOUNTER — Ambulatory Visit (INDEPENDENT_AMBULATORY_CARE_PROVIDER_SITE_OTHER): Payer: 59 | Admitting: Family Medicine

## 2017-10-24 ENCOUNTER — Ambulatory Visit (INDEPENDENT_AMBULATORY_CARE_PROVIDER_SITE_OTHER): Payer: 59 | Admitting: Psychology

## 2017-10-24 VITALS — BP 101/68 | HR 68 | Temp 98.3°F | Ht 63.0 in | Wt 197.0 lb

## 2017-10-24 DIAGNOSIS — Z114 Encounter for screening for human immunodeficiency virus [HIV]: Secondary | ICD-10-CM | POA: Diagnosis not present

## 2017-10-24 DIAGNOSIS — Z9189 Other specified personal risk factors, not elsewhere classified: Secondary | ICD-10-CM

## 2017-10-24 DIAGNOSIS — Z6834 Body mass index (BMI) 34.0-34.9, adult: Secondary | ICD-10-CM

## 2017-10-24 DIAGNOSIS — E282 Polycystic ovarian syndrome: Secondary | ICD-10-CM | POA: Diagnosis not present

## 2017-10-24 DIAGNOSIS — F3289 Other specified depressive episodes: Secondary | ICD-10-CM | POA: Diagnosis not present

## 2017-10-24 DIAGNOSIS — Z118 Encounter for screening for other infectious and parasitic diseases: Secondary | ICD-10-CM | POA: Diagnosis not present

## 2017-10-24 DIAGNOSIS — E669 Obesity, unspecified: Secondary | ICD-10-CM

## 2017-10-24 DIAGNOSIS — Z113 Encounter for screening for infections with a predominantly sexual mode of transmission: Secondary | ICD-10-CM | POA: Diagnosis not present

## 2017-10-24 DIAGNOSIS — E559 Vitamin D deficiency, unspecified: Secondary | ICD-10-CM

## 2017-10-24 DIAGNOSIS — G43809 Other migraine, not intractable, without status migrainosus: Secondary | ICD-10-CM | POA: Diagnosis not present

## 2017-10-24 DIAGNOSIS — Z01419 Encounter for gynecological examination (general) (routine) without abnormal findings: Secondary | ICD-10-CM | POA: Diagnosis not present

## 2017-10-24 DIAGNOSIS — Z124 Encounter for screening for malignant neoplasm of cervix: Secondary | ICD-10-CM | POA: Diagnosis not present

## 2017-10-24 DIAGNOSIS — Z1151 Encounter for screening for human papillomavirus (HPV): Secondary | ICD-10-CM | POA: Diagnosis not present

## 2017-10-24 DIAGNOSIS — Z1159 Encounter for screening for other viral diseases: Secondary | ICD-10-CM | POA: Diagnosis not present

## 2017-10-24 MED ORDER — VITAMIN D (ERGOCALCIFEROL) 1.25 MG (50000 UNIT) PO CAPS: 50000.0000 [IU] | ORAL_CAPSULE | ORAL | 0 refills | Status: DC

## 2017-10-24 MED FILL — VIT D2 1.25 MG (50,000 UNIT: 1.25 MG | 28 days supply | Qty: 4 | Fill #0

## 2017-10-24 NOTE — Progress Notes (Signed)
Office: [phone]  /  Fax: [phone] Date: October 24, 2017 Time Seen: 4:00pm Duration: 70 minutes Provider: Lawerance Cruel, PsyD Type of Session: Intake for Individual Therapy   Informed Consent:The provider's role was explained to Alison Davies. The provider discussed issues of confidentiality, privacy, and limits therein; anticipated course of treatment; potential risks involved with psychotherapy; voluntary nature of treatment; and the clinic's cancellation policy. In addition to written consent, verbal informed consent for psychological services was also obtained from Alison Davies prior to initial intake interview.   Alison Davies was informed that information about mental health appointments will be entered in the medical record at [facility]) via Epic. Alison Davies agreed information may be shared with other [facility] providers as needed for coordination of care. Written consent was also provided for this provider to coordinate care with other providers at [facility].The provider also explained leaving voicemail messages and MyChart can be utilized for non-emergency reasons and limits of confidentiality related to communication via technology was discussed. Alison Davies was also informed the clinic is not a 24/7 crisis center and mental health emergency resources were shared. A handout with emergency resources was also provided. Alison Davies verbally acknowledged understanding and also agreed to use mental health emergency resources discussed if needed.   Chief Complaint: Alison Davies was referred by Dr. Debbra Riding. Per the note for the initial visit with Dr. Rinaldo Ratel on July 26, 2017, Alison Davies has significant food cravings issues, wakes up frquently in the middle of the night to eat, skips meals frequently, frequently drinks liquids with calories, frequently makes poor food choices, frequently eats larger portions than normal, and struggles with emotional eating.    Per Alison Mosses, emotional eating has always been an issue "even through middle school and high school." She reported an increase in emotional eating in high school paired with not eating at times as well as binge eating. Alison Davies shared she would "make" herself throw up; however, that did not last a long time. She noted the last time she made her self throw up was during senior year of high school. She indicated she will still restrict food intake at times and she continuously eats when sad or stressed. Alison Davies reported the last time she engaged in emotional eating was last week. She explained she ate a frozen pizza, bag of pizza, and two liters of soda over the course of one hour to one and a half hours. She shared the aforementioned occurs once or twice a month.   HPI: Per the note for the initial visit with Dr. Rinaldo Ratel on July 26, 2017, Alison Davies started gaining weight 1st year of college and her heaviest weight ever was 210 pounds.   Mental Status Examination: Alison Davies arrived on time for the appointment. She presented as appropriately dressed and groomed. Alison Davies appeared her stated age and demonstrated adequate orientation to time, place, person, and purpose of the appointment. She also demonstrated appropriate eye contact. No psychomotor abnormalities or behavioral peculiarities noted. Her mood was sad with congruent affect. Her thought processes were logical, linear, and goal-directed. No hallucinations, delusions, bizarre thinking or behavior reported or observed. Judgment, insight, and impulse control appeared to be grossly intact. There was no evidence of paraphasias (i.e., errors in speech, gross mispronunciations, and word substitutions), repetition deficits, or disturbances in volume or prosody (i.e., rhythm and intonation). There was no evidence of attention or memory impairments. Alison Davies denied current suicidal and homicidal ideation, plan, and intent.   The Mini-Mental State Examination,  Second Edition  (MMSE-2) was administered. The MMSE-2 briefly screens for cognitive dysfunction and overall mental status and assesses different cognitive domains: orientation, registration, attention and calculation, recall, and language and praxis. Alison Davies received 30 out of 30 points possible on the MMSE-2, which is noted in the normal range.  Family & Psychosocial History: Alison Davies reported she is currently single and does not have any children. She shared she is in the process of trying to date via online dating. 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 She shared her highest level of education is a Energy managerBachelor's of Arts degree in Biology. She shared she currently lives alone. Her social support system consists of her parents, friends, and family. Alison Davies reported a healthy relationship with her parents and she has one younger brother.   Medical History: Per Alison Davies, her medical history is significant for migraines with auras. She shared she currently takes Zoloft, Xanax as needed, Imitrex, and Topiramate. Alison Davies denied a history of surgeries, hospitalizations, head injuries, and loss of consciousness.   Mental Health History: Alison Davies has never received therapeutic services, including seeing a psychiatrist. She denied a history of hospitalization for psychiatric concerns. Per Alison Davies, her maternal grandfather was an alcoholic. He is deceased. Her mother suffers from generalized anxiety. Alison Davies shared she was diagnosed by her primary care provider with generalized anxiety disorder three years ago. 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 Alison Davies shared her last panic attack was approximately one year ago, which was around the time she started a new job. Alison Davies shared her primary care physician prescribed both Zoloft and Xanax. She noted, "I like being on the Zoloft. I rarely take the Xanax. I only take it when I have racing thoughts and can't sleep."     Currently, Alison Davies reported feeling tired and taking naps to compensate. She indicated she does not feel understood in trying to do anything (e.g., "Getting my apartment.") She indicated she just moved into her apartment and she is still getting used to it, but the "overwhelming feeling is subsiding." She denied experiencing the following: obsessions and compulsions; mania; substance use; engagement in self-injurious behaviors; hallucinations and delusions; and homicidal ideation, plan, and intent. She reported experiencing suicidal ideation (I.e., "I wish I was dead.") in college, but denied experiencing thoughts since then. The last time she had thoughts of suicide was around 2010. She denied plan and intent when did experience ideation. Alison Davies denied current suicidal ideation, plan, and intent. Alison Davies also denied a history of trauma, including sexual, physical, and emotional abuse, as well as neglect. Alison Davies reported the following strengths: "pretty organized" and "pretty smart." She shared she copes with stressors with food, talking things out, and watching television.  Structured Assessment Results: The Patient Health Questionnaire-9 (PHQ-9) is a self-report measure that assesses symptoms and severity of depression over the course of the last two weeks. Alison Davies obtained a score of nine suggesting mild depression.The following items were endorsed: little interest or pleasure in doing things; feeling down, depressed, or hopeless; trouble falling or staying asleep, or sleeping too much; feeling tired or having little energy; and poor appetite or overeating. Alison Davies finds the aforementioned symptoms to be somewhat difficult.   The Generalized Anxiety Disorder-7 (GAD-7) is a brief self-report measure that assesses symptoms of anxiety over the course of the last two weeks. Alison Davies obtained a score of 10 suggesting moderate anxiety. The following items were endorsed: feeling nervous, anxious, or on edge; not being  able  to stop or control worrying; worrying too much about different things; trouble relaxing; being so restless that it's hard to sit still; becoming easily annoyed or irritable.  Kissa finds the aforementioned symptoms to be somewhat difficult.   Interventions: A chart review was conducted prior to the clinical intake interview. The MMSE-2, PHQ-9, and GAD-7 were administered and a clinical intake interview was completed. Throughout session, empathic reflections and validation was provided. Continuing treatment with this provider was discussed and treatment goals were established. Psychoeducation regarding the fight or flight system was provided to increase understanding of current symptomatology. In addition, burn out and self-care was briefly discussed. Moreover, psychoeducation regarding emotional versus physical hunger was provided. Alexias was given a handout and encouraged to refer to it between now and the next appointment to increase awareness. Teya agreed.  Provisional DSM-5 Diagnoses: 311 (F32.8) Other Specified Depressive Disorder, Emotional Eating and 300.02 (F41.1) Generalized Anxiety Disorder (per history)  Plan: Tashai expressed understanding and agreement with the initial treatment plan of care. She appears able and willing to participate as evidenced by collaboration on treatment goals, engagement in reciprocal conversation, and asking questions as needed for clarification. Morena was given the option to return next week or in three weeks due to the Fourth of July holiday and the provider being out of the office the week of July 8th. The next appointment will be scheduled in three weeks per Ixchel's request. The following treatment goals were established: decreasing emotional eating and increasing self-compassion.

## 2017-10-24 NOTE — Progress Notes (Signed)
Office: [phone]  /  Fax: [phone]   HPI:   Chief Complaint: OBESITY Alison Davies is here to discuss her progress with her obesity treatment plan. She is on the Category 3 plan and is following her eating plan approximately 30 % of the time. She states she is exercising 0 minutes 0 times per week. Alison Davies has upcoming trip to [place] for 7 days to celebrate grandmother's 90th birthday. Struggled with following plan secondary to recent move.  Her weight is 197 lb (89.4 kg) today and has gained 1 pound since her last visit. She has lost 2 lbs since starting treatment with [name].  Vitamin D Deficiency Alison Davies has a diagnosis of vitamin D deficiency. She is currently taking prescription Vit D She notes fatigue and denies nausea, vomiting or muscle weakness.  At risk for osteopenia and osteoporosis Alison Davies is at higher risk of osteopenia and osteoporosis due to vitamin D deficiency.   Migraines Alison Davies note migraines, controlled on increase of Topamax and Imitrex.  ALLERGIES: No Known Allergies  MEDICATIONS: Current Outpatient Medications on File Prior to Visit  Medication Sig Dispense Refill  . ALPRAZolam (XANAX) 0.25 MG tablet Take 0.25 mg by mouth as needed for anxiety.    . Biotin 3 MG TABS Take 2 tablets by mouth daily.    . Cyanocobalamin (VITAMIN B-12 CR) 1500 MCG TBCR Take 2 tablets by mouth 2 (two) times daily.    . Melatonin 5 MG CHEW Chew 2 capsules by mouth at bedtime as needed.    . Multiple Vitamin (MULTIVITAMIN WITH MINERALS) TABS tablet Take 1 tablet by mouth daily.    . norethindrone (MICRONOR,CAMILA,ERRIN) 0.35 MG tablet Take 1 tablet (0.35 mg total) by mouth daily. 1 Package 11  . sertraline (ZOLOFT) 50 MG tablet Take 1 tablet (50 mg total) by mouth daily. 30 tablet 5  . SUMAtriptan (IMITREX) 25 MG tablet Take 1 tablet (25 mg total) by mouth every 2 (two) hours as needed for migraine. May repeat in 2 hours if headache persists or recurs. 10 tablet 2  . topiramate (TOPAMAX) 50  MG tablet 1- 2 tablets night for migraines 60 tablet 2  . vitamin C (ASCORBIC ACID) 250 MG tablet Take 250 mg by mouth daily.     No current facility-administered medications on file prior to visit.     PAST MEDICAL HISTORY: Past Medical History:  Diagnosis Date  . Anxiety   . Hyperlipidemia   . Leg edema   . Migraines     PAST SURGICAL HISTORY: No past surgical history on file.  SOCIAL HISTORY: Social History   Tobacco Use  . Smoking status: Never Smoker  . Smokeless tobacco: Never Used  Substance Use Topics  . Alcohol use: Not on file  . Drug use: Not on file    FAMILY HISTORY: Family History  Problem Relation Age of Onset  . Anxiety disorder Mother   . Diabetes Father   . Obesity Father     ROS: Review of Systems  Constitutional: Positive for malaise/fatigue. Negative for weight loss.  Gastrointestinal: Negative for nausea and vomiting.  Musculoskeletal:       Negative muscle weakness  Neurological: Positive for headaches.    PHYSICAL EXAM: Blood pressure 101/68, pulse 68, temperature 98.3 F (36.8 C), temperature source Oral, height 5\' 3"  (1.6 m), weight 197 lb (89.4 kg), SpO2 99 %. Body mass index is 34.9 kg/m. Physical Exam  Constitutional: She is oriented to person, place, and time. She appears well-developed and well-nourished.  Cardiovascular: Normal rate.  Pulmonary/Chest: Effort normal.  Musculoskeletal: Normal range of motion.  Neurological: She is oriented to person, place, and time.  Skin: Skin is warm and dry.  Psychiatric: She has a normal mood and affect. Her behavior is normal.  Vitals reviewed.   RECENT LABS AND TESTS: BMET    Component Value Date/Time   NA 138 06/02/2017 0945   K 4.4 06/02/2017 0945   CL 101 06/02/2017 0945   CO2 21 06/02/2017 0945   GLUCOSE 99 06/02/2017 0945   GLUCOSE 84 03/03/2014 1208   BUN 11 06/02/2017 0945   CREATININE 0.62 06/02/2017 0945   CALCIUM 9.6 06/02/2017 0945   GFRNONAA 121 06/02/2017 0945    GFRAA 140 06/02/2017 0945   Lab Results  Component Value Date   HGBA1C 5.1 07/26/2017   Lab Results  Component Value Date   INSULIN 14.6 07/26/2017   CBC    Component Value Date/Time   WBC 8.2 06/02/2017 0945   RBC 4.78 06/02/2017 0945   HGB 14.5 06/02/2017 0945   HCT 44.3 06/02/2017 0945   PLT 323 06/02/2017 0945   MCV 93 06/02/2017 0945   MCH 30.3 06/02/2017 0945   MCHC 32.7 06/02/2017 0945   RDW 12.3 06/02/2017 0945   LYMPHSABS 2.4 06/02/2017 0945   EOSABS 0.2 06/02/2017 0945   BASOSABS 0.0 06/02/2017 0945   Iron/TIBC/Ferritin/ %Sat No results found for: IRON, TIBC, FERRITIN, IRONPCTSAT Lipid Panel     Component Value Date/Time   CHOL 250 (H) 07/26/2017 1127   TRIG 158 (H) 07/26/2017 1127   HDL 55 07/26/2017 1127   CHOLHDL 5.0 (H) 06/02/2017 0945   CHOLHDL 5.1 03/03/2014 1208   VLDL 41 (H) 03/03/2014 1208   LDLCALC 163 (H) 07/26/2017 1127   Hepatic Function Panel     Component Value Date/Time   PROT 7.3 06/02/2017 0945   ALBUMIN 4.5 06/02/2017 0945   AST 14 06/02/2017 0945   ALT 17 06/02/2017 0945   ALKPHOS 66 06/02/2017 0945   BILITOT 0.4 06/02/2017 0945   BILIDIR 0.12 06/02/2017 0945      Component Value Date/Time   TSH 1.850 07/26/2017 1127  Results for SELEN, SMUCKER (MRN [id number]) as of 10/24/2017 17:00  Ref. Range 07/26/2017 11:27  Vitamin D, 25-Hydroxy Latest Ref Range: 30.0 - 100.0 ng/mL 14.7 (L)    ASSESSMENT AND PLAN: Vitamin D deficiency - Plan: Vitamin D, Ergocalciferol, (DRISDOL) 50000 units CAPS capsule  Other migraine without status migrainosus, not intractable  At risk for osteoporosis  Class 1 obesity with serious comorbidity and body mass index (BMI) of 34.0 to 34.9 in adult, unspecified obesity type  PLAN:  Vitamin D Deficiency Miangel was informed that low vitamin D levels contributes to fatigue and are associated with obesity, breast, and colon cancer. Sheritta agrees to continue taking prescription Vit D @50 ,000 IU every  week #4 and we will refill for 1 month. She will follow up for routine testing of vitamin D, at least 2-3 times per year. She was informed of the risk of over-replacement of vitamin D and agrees to not increase her dose unless she discusses this with [name] first. Kaylany agrees to follow up with our clinic in 2 weeks.  At risk for osteopenia and osteoporosis Emrie is at risk for osteopenia and osteoporsis due to her vitamin D deficiency. She was encouraged to take her vitamin D and follow her higher calcium diet and increase strengthening exercise to help strengthen her bones and decrease her risk of  osteopenia and osteoporosis.  Migraines Alison Davies will continue her current medications and follow up with her primary car physician. Alison Davies agrees to follow up with our clinic in 2 weeks.  Obesity Alison Davies is currently in the action stage of change. As such, her goal is to continue with weight loss efforts She has agreed to follow the Category 3 plan Alison Davies has been instructed to work up to a goal of 150 minutes of combined cardio and strengthening exercise per week for weight loss and overall health benefits. We discussed the following Behavioral Modification Strategies today: increasing lean protein intake, increasing vegetables, work on meal planning and easy cooking plans, travel eating strategies, and planning for success    Alison Davies has agreed to follow up with our clinic in 2 weeks. She was informed of the importance of frequent follow up visits to maximize her success with intensive lifestyle modifications for her multiple health conditions.   OBESITY BEHAVIORAL INTERVENTION VISIT  Today's visit was # 6 out of 22.  Starting weight: 199 lbs Starting date: 07/26/17 Today's weight : 197 lbs  Today's date: 10/24/2017 Total lbs lost to date: 2 (Patients must lose 7 lbs in the first 6 months to continue with counseling)   ASK: We discussed the diagnosis of obesity with Arva Chafeiana M Liberati today and Alison Davies  agreed to give us permission to discuss obesity behavioral modification therapy today.  ASSESS: Alison Davies has the diagnosis of obesity and her BMI today is 34.91 Alison Davies is in the action stage of change   ADVISE: Alison Davies was educated on the multiple health risks of obesity as well as the benefit of weight loss to improve her health. She was advised of the need for long term treatment and the importance of lifestyle modifications.  AGREE: Multiple dietary modification options and treatment options were discussed and  Alison Davies agreed to the above obesity treatment plan.  I, Burt KnackSharon Martin, am acting as transcriptionist for Debbra RidingAlexandria Kadolph, MD  I have reviewed the above documentation for accuracy and completeness, and I agree with the above. - Debbra RidingAlexandria Kadolph, MD

## 2017-10-31 NOTE — Progress Notes (Unsigned)
Office: [phone]  /  Fax: [phone]  Date: November 13, 2017 Time Seen:*** Duration:*** Provider: Lawerance CruelGaytri Ameriah Davies, Psy.D. Type of Session: Individual Therapy   HPI: Alison Davies was referred by Alison Davies. Per the note for the initial visit with Alison Davies on July 26, 2017, Alison Davies has significant food cravings issues, wakes up frquently in the middle of the night to eat, skips meals frequently, frequently drinks liquids with calories, frequently makes poor food choices, frequently eats larger portions than normal, and struggles with emotional eating. Per the initial visit note with this provider on October 24, 2017, Alison Davies reported emotional eating has always been an issue "even through middle school and high school." She reported an increase in emotional eating in high school paired with not eating at times as well as binge eating. Alison Davies shared she would "make" herself throw up; however, that did not last a long time. She noted the last time she made her self throw up was during senior year of high school. She indicated she will still restrict food intake at times and she continuously eats when sad or stressed. Alison Davies reported the last time she engaged in emotional eating was the week prior to the initial visit with this provider. She explained she ate a frozen pizza, bag of pizza, and two liters of soda over the course of one hour to one and a half hours. She shared the aforementioned occurs once or twice a month. Per the note for the initial visit with Alison Davies on July 26, 2017, Alison Davies started gaining weight 1st year of college and herheaviest weight ever was 210pounds.   Session Content: Session focused on the goals of decreasing emotional eating and increasing self-compassion via increasing awareness. A brief check-in was conducted.  The provider reviewed physical versus emotional hunger.  Session then focused on discussing triggers for emotional eating. The following triggers for Alison Davies  were identified:   Furthermore, thought defusion was introduced, and Alison Davies was led through a thought defusion exercise.    Alison Davies was receptive to today's session as evidenced by ***.  Structured Assessments Results: The Patient Health Questionnaire-9 (PHQ-9) is a self-report measure that assesses symptoms and severity of depression over the course of the last two weeks. Alison Davies obtained a score of *** suggesting*** depression.The following items were endorsed: Alison Davies. Alison Davies finds the aforementioned symptoms to be ***  The Generalized Anxiety Disorder-7 (GAD-7) is a brief self-report measure that assesses symptoms of anxiety over the course of the last two weeks. Alison Davies obtained a score of *** suggesting *** anxiety. The following items were endorsed: ***.  Alison Davies finds the aforementioned symptoms to be ***.  Mental Status Examination: Alison Davies arrived on time for the appointment. She presented as appropriately dressed and groomed. Alison Davies appeared her stated age and demonstrated adequate orientation to time, place, person, and purpose of the appointment. She also demonstrated appropriate eye contact. No psychomotor abnormalities or behavioral peculiarities noted. Her mood was *** with congruent affect. Her thought processes were logical, linear, and goal-directed. No hallucinations, delusions, bizarre thinking or behavior reported or observed. Judgment, insight, and impulse control appeared to be grossly intact. There was no evidence of paraphasias (i.e., errors in speech, gross mispronunciations, and word substitutions), repetition deficits, or disturbances in volume or prosody (i.e., rhythm and intonation). There was no evidence of attention or memory impairments. Alison Davies denied current suicidal and homicidal ideation, intent or plan.  Interventions: Content from the last session was reviewed (I.e., emotional versus physical hunger). Throughout today's session, empathic  reflections and validation were provided.  Psychoeducation regarding triggers for emotional eating was provided. Alison Davies was given a handout to reference when needed. Psychoeducation regarding thought defusion was also provided, and Alison Davies was led through a thought defusion exercise.   DSM-5 Diagnoses: 311 (F32.8) Other Specified Depressive Disorder, Emotional Eating and 300.02 (F41.1) Generalized Anxiety Disorder (per history)  Plan: Alison Davies continues to appear able and willing to participate as evidenced by ***. The next appointment will be scheduled in ***. Session will focus on reviewing learned skills, and working towards established treatment goals.

## 2017-11-13 NOTE — Progress Notes (Signed)
Office: [phone]  /  Fax: [phone]    Date: November 23, 2017 Time Seen: 7:55am Duration: 35 minutes Provider: Lawerance Cruel, Psy.D. Type of Session: Individual Therapy   HPI: Keiva was referred by Dr. Debbra Riding. Per the note for the initial visit with Dr. Rinaldo Ratel on July 26, 2017, Darenda has significant food cravings issues, wakes up frquently in the middle of the night to eat, skips meals frequently, frequently drinks liquids with calories, frequently makes poor food choices, frequently eats larger portions than normal, and struggles with emotional eating. Per the note for the initial visit with this provider on October 24, 2017, Susana reported emotional eating has always been an issue "even through middle school and high school." She reported an increase in emotional eating in high school paired with not eating at times as well as binge eating. Thamara shared she would "make" herself throw up; however, that did not last a long time. She noted the last time she made her self throw up was during senior year of high school. She indicated she will still restrict food intake at times and she continuously eats when sad or stressed. Alvine reported the last time she engaged in emotional eating was last week. She explained she ate a frozen pizza, bag of pizza, and two liters of soda over the course of one hour to one and a half hours. She shared the aforementioned occurs once or twice a month. Per the note for the initial visit with Dr. Rinaldo Ratel on July 26, 2017, Salle started gaining weight 1st year of college and herheaviest weight ever was 210pounds.   Session Content: Session focused on the goals of decreasing emotional eating and increasing self-compassion. The session was initiated with the administration of the PHQ-9 and GAD-7, as well as a a brief check-in. Savaya reported, "Everything has been okay, but eating wise, I've been eating emotionally." This was explored further. She explained she  recently went through a break up and there are ongoing stressors. Gennesis reported increased irritability a few weeks ago at work due to stressors, but noted, "Things are good now." Session then focused on sleep hygiene, as Merleen reported sleeping difficulties at night and subsequent fatigue throughout the day. She was provided with a handout. Kelita requested guidance on how to establish a night routine; therefore, she was provided with a sleep diary handout to assist her in tracking various variables (e.g., when she went to bed, when she woke up, how she felt, what she did during the hour before bed, etc.). Remainder of session focused on emotional eating. Regarding emotional and physical hunger, she reported increased awareness. She was provided with a handout for triggers for emotional eating. The provider discussed the impact of sleep difficulties on eating, as fatigue can be a trigger for emotional eating. She was encouraged to utilize the handout between now and the next appointment to identify her triggers and increase awareness of the frequency of the triggers.   Navreet was receptive to today's session as evidenced by her asking questions for clarification and sharing about her past month. In addition, at the the conclusion of today's appointment, Jamar stated, "Thank you for everything."   Mental Status Examination: Khloie arrived on time for the appointment. She presented as appropriately dressed and groomed. Viha appeared her stated age and demonstrated adequate orientation to time, place, person, and purpose of the appointment. She also demonstrated appropriate eye contact. No psychomotor abnormalities or behavioral peculiarities noted. Her mood was euthymic with congruent affect.  Her thought processes were logical, linear, and goal-directed. No hallucinations, delusions, bizarre thinking or behavior reported or observed. Judgment, insight, and impulse control appeared to be grossly intact. There was no  evidence of paraphasias (i.e., errors in speech, gross mispronunciations, and word substitutions), repetition deficits, or disturbances in volume or prosody (i.e., rhythm and intonation). There was no evidence of attention or memory impairments. Lafonda MossesDiana denied current suicidal and homicidal ideation, intent or plan.  Structured Assessment Results: The Patient Health Questionnaire-9 (PHQ-9) is a self-report measure that assesses symptoms and severity of depression over the course of the last two weeks. Lafonda MossesDiana obtained a score of six suggesting mild depression. Lafonda MossesDiana finds the endorsed symptoms to be somewhat difficult.  Depression screen PHQ 2/9 11/23/2017  Decreased Interest 2  Down, Depressed, Hopeless 2  PHQ - 2 Score 4  Altered sleeping 1  Tired, decreased energy 1  Change in appetite 0  Feeling bad or failure about yourself  0  Trouble concentrating 0  Moving slowly or fidgety/restless 0  Suicidal thoughts 0  PHQ-9 Score 6  Difficult doing work/chores -   The Generalized Anxiety Disorder-7 (GAD-7) is a brief self-report measure that assesses symptoms of anxiety over the course of the last two weeks. Lafonda MossesDiana obtained a score of four suggesting minimal anxiety.  GAD 7 : Generalized Anxiety Score 11/23/2017 10/24/2017  Nervous, Anxious, on Edge 1 2  Control/stop worrying 0 2  Worry too much - different things 0 2  Trouble relaxing 0 1  Restless 1 1  Easily annoyed or irritable 2 2  Afraid - awful might happen 0 0  Total GAD 7 Score 4 10  Anxiety Difficulty Somewhat difficult Somewhat difficult   Interventions: Lafonda MossesDiana was administered the PHQ-9 and GAD-7 for symptom monitoring. Content from the last session was reviewed (I.e., emotional versus physical hunger). Throughout today's session, empathic reflections and validation were provided. Psychoeducation regarding sleep hygiene and triggers for emotional eating was provided.   DSM-5 Diagnosis: 311 (F32.8) Other Specified Depressive Disorder,  Emotional Eating and 300.02 (F41.1) Generalized Anxiety Disorder (per history)  Plan: Lafonda MossesDiana continues to appear able and willing to participate as evidenced by engagement in reciprocal conversation, and asking questions for clarification as appropriate. The next appointment will be scheduled in two weeks. The next session will focus on reviewing learned skills, and working towards established treatment goals.

## 2017-11-14 ENCOUNTER — Ambulatory Visit (INDEPENDENT_AMBULATORY_CARE_PROVIDER_SITE_OTHER): Payer: Self-pay | Admitting: Family Medicine

## 2017-11-14 ENCOUNTER — Ambulatory Visit (INDEPENDENT_AMBULATORY_CARE_PROVIDER_SITE_OTHER): Payer: 59 | Admitting: Psychology

## 2017-11-15 MED FILL — TOPIRAMATE 50 MG TABLET: 50 | 30 days supply | Qty: 60 | Fill #1

## 2017-11-21 MED FILL — SERTRALINE HCL 50 MG TABLET: 50 | 30 days supply | Qty: 30 | Fill #3

## 2017-11-23 ENCOUNTER — Ambulatory Visit (INDEPENDENT_AMBULATORY_CARE_PROVIDER_SITE_OTHER): Payer: 59 | Admitting: Psychology

## 2017-11-23 ENCOUNTER — Ambulatory Visit (INDEPENDENT_AMBULATORY_CARE_PROVIDER_SITE_OTHER): Payer: 59 | Admitting: Physician Assistant

## 2017-11-23 VITALS — BP 116/69 | HR 85 | Temp 98.2°F | Ht 63.0 in | Wt 195.0 lb

## 2017-11-23 DIAGNOSIS — F3289 Other specified depressive episodes: Secondary | ICD-10-CM

## 2017-11-23 DIAGNOSIS — G4709 Other insomnia: Secondary | ICD-10-CM | POA: Diagnosis not present

## 2017-11-23 DIAGNOSIS — Z9189 Other specified personal risk factors, not elsewhere classified: Secondary | ICD-10-CM

## 2017-11-23 DIAGNOSIS — Z6841 Body Mass Index (BMI) 40.0 and over, adult: Secondary | ICD-10-CM

## 2017-11-23 DIAGNOSIS — E559 Vitamin D deficiency, unspecified: Secondary | ICD-10-CM | POA: Diagnosis not present

## 2017-11-23 MED ORDER — VITAMIN D (ERGOCALCIFEROL) 1.25 MG (50000 UNIT) PO CAPS: 50000.0000 [IU] | ORAL_CAPSULE | ORAL | 0 refills | Status: DC

## 2017-11-23 MED FILL — VIT D2 1.25 MG (50,000 UNIT: 1.25 MG | 28 days supply | Qty: 4 | Fill #0

## 2017-11-23 NOTE — Progress Notes (Unsigned)
  Office: [phone]  /  Fax: [phone]  Date: December 07, 2017 Time Seen:*** Duration:*** Provider: Lawerance CruelGaytri Nollie Davies, Psy.D. Type of Session: Individual Therapy   HPI: Alison Davies referred by Dr. Debbra RidingAlexandria Davies. Per the note for the initial visit with Dr. Rinaldo RatelKadolph on July 26, 2017, Alison Davies significant food cravings issues,wakes up frquently in the middle of the night to eat,skips meals frequently,frequentlydrinksliquids with calories,frequently makes poor food choices,frequently eats larger portions than normal, andstruggles with emotional eating. Per the note for the initial visit with this provider on October 24, 2017, Alison Davies reported emotional eating has always been an issue "even through middle school and high school." She reported an increase in emotional eating in high school paired with not eating at times as well as binge eating. Alison Davies shared she would "make" herself throw up; however, that did not last a long time. She noted the last time she made her self throw up was during senior year of high school. She indicated she will still restrict food intake at times and she continuously eats when sad or stressed. Alison Davies reported the last time she engaged in emotional eating was last week. She explained she ate a frozen pizza, bag of pizza, and two liters of soda over the course of one hour to one and a half hours. She shared the aforementioned occurs once or twice a month.Per the note for the initial visit with Dr. Rinaldo RatelKadolph on July 26, 2017, [name] gaining weight 1st year of collegeandherheaviest weight ever was 210pounds.  Session Content: Session focused on the goals of decreasing emotional eating and increasing self-compassion. The session was initiated with the administration of the PHQ-9 and GAD-7, as well as a a brief check-in.   Alison Davies was receptive to today's session as evidenced by ***.  Mental Status Examination: Alison Davies arrived on time for the appointment. She  presented as appropriately dressed and groomed. Alison Davies appeared her stated age and demonstrated adequate orientation to time, place, person, and purpose of the appointment. She also demonstrated appropriate eye contact. No psychomotor abnormalities or behavioral peculiarities noted. Her mood was *** with congruent affect. Her thought processes were logical, linear, and goal-directed. No hallucinations, delusions, bizarre thinking or behavior reported or observed. Judgment, insight, and impulse control appeared to be grossly intact. There was no evidence of paraphasias (i.e., errors in speech, gross mispronunciations, and word substitutions), repetition deficits, or disturbances in volume or prosody (i.e., rhythm and intonation). There was no evidence of attention or memory impairments. Alison Davies denied current suicidal and homicidal ideation, intent or plan.  Interventions: Alison Davies was administered the PHQ-9 and GAD-7 for symptom monitoring. Content from the last session was reviewed (I.e., sleep hygiene and triggers for emotional eating). Throughout today's session, empathic reflections and validation were provided. Psychoeducation regarding *** was provided and *** [insert other interventions].   DSM-5 Diagnosis: 311 (F32.8) Other Specified Depressive Disorder, Emotional Eating and 300.02 (F41.1) Generalized Anxiety Disorder (per history)  Plan: Alison Davies continues to appear able and willing to participate as evidenced by engagement in reciprocal conversation, and asking questions for clarification as appropriate.*** The next appointment will be scheduled in ***. The next session will focus on reviewing learned skills, and working towards established treatment goals.

## 2017-11-27 NOTE — Progress Notes (Signed)
Office: [phone]  /  Fax: [phone]   HPI:   Chief Complaint: OBESITY Alison Davies is here to discuss her progress with her obesity treatment plan. She is on the Category 3 plan and is following her eating plan approximately 50 % of the time. She states she is walking 20 to 30 minutes 1 to 2 times per week. Alison Davies has done well with weight loss over the last few weeks. She reports going out to eat and making smart choices during dinner. Her weight is 195 lb (88.5 kg) today and has had a weight loss of 2 pounds over a period of 4 weeks since her last visit. She has lost 4 lbs since starting treatment with Alison Davies.  Vitamin D deficiency Alison Davies has a diagnosis of vitamin D deficiency. She is currently taking prescription vit D and denies nausea, vomiting or muscle weakness.  At risk for osteopenia and osteoporosis Alison Davies is at higher risk of osteopenia and osteoporosis due to vitamin D deficiency.   Insomnia Alison Davies reports problems with sleep hygiene. We discussed restarting melatonin to help with sleep.  ALLERGIES: No Known Allergies  MEDICATIONS: Current Outpatient Medications on File Prior to Visit  Medication Sig Dispense Refill  . ALPRAZolam (XANAX) 0.25 MG tablet Take 0.25 mg by mouth as needed for anxiety.    . Biotin 3 MG TABS Take 2 tablets by mouth daily.    . Cyanocobalamin (VITAMIN B-12 CR) 1500 MCG TBCR Take 2 tablets by mouth 2 (two) times daily.    . Melatonin 5 MG CHEW Chew 2 capsules by mouth at bedtime as needed.    . Multiple Vitamin (MULTIVITAMIN WITH MINERALS) TABS tablet Take 1 tablet by mouth daily.    . norethindrone (MICRONOR,CAMILA,ERRIN) 0.35 MG tablet Take 1 tablet (0.35 mg total) by mouth daily. 1 Package 11  . sertraline (ZOLOFT) 50 MG tablet Take 1 tablet (50 mg total) by mouth daily. 30 tablet 5  . SUMAtriptan (IMITREX) 25 MG tablet Take 1 tablet (25 mg total) by mouth every 2 (two) hours as needed for migraine. May repeat in 2 hours if headache persists or  recurs. 10 tablet 2  . topiramate (TOPAMAX) 50 MG tablet 1- 2 tablets night for migraines 60 tablet 2  . vitamin C (ASCORBIC ACID) 250 MG tablet Take 250 mg by mouth daily.     No current facility-administered medications on file prior to visit.     PAST MEDICAL HISTORY: Past Medical History:  Diagnosis Date  . Anxiety   . Hyperlipidemia   . Leg edema   . Migraines     PAST SURGICAL HISTORY: No past surgical history on file.  SOCIAL HISTORY: Social History   Tobacco Use  . Smoking status: Never Smoker  . Smokeless tobacco: Never Used  Substance Use Topics  . Alcohol use: Not on file  . Drug use: Not on file    FAMILY HISTORY: Family History  Problem Relation Age of Onset  . Anxiety disorder Mother   . Diabetes Father   . Obesity Father     ROS: Review of Systems  Constitutional: Positive for weight loss.  Gastrointestinal: Negative for nausea and vomiting.  Musculoskeletal:       Negative for muscle weakness  Psychiatric/Behavioral: The patient has insomnia.     PHYSICAL EXAM: Blood pressure 116/69, pulse 85, temperature 98.2 F (36.8 C), temperature source Oral, height 5\' 3"  (1.6 m), weight 195 lb (88.5 kg), last menstrual period 07/27/2017, SpO2 96 %. Body mass index is  34.54 kg/m. Physical Exam  Constitutional: She is oriented to person, place, and time. She appears well-developed and well-nourished.  Cardiovascular: Normal rate.  Pulmonary/Chest: Effort normal.  Musculoskeletal: Normal range of motion.  Neurological: She is oriented to person, place, and time.  Skin: Skin is warm and dry.  Psychiatric: She has a normal mood and affect. Her behavior is normal.  Vitals reviewed.   RECENT LABS AND TESTS: BMET    Component Value Date/Time   NA 138 06/02/2017 0945   K 4.4 06/02/2017 0945   CL 101 06/02/2017 0945   CO2 21 06/02/2017 0945   GLUCOSE 99 06/02/2017 0945   GLUCOSE 84 03/03/2014 1208   BUN 11 06/02/2017 0945   CREATININE 0.62  06/02/2017 0945   CALCIUM 9.6 06/02/2017 0945   GFRNONAA 121 06/02/2017 0945   GFRAA 140 06/02/2017 0945   Lab Results  Component Value Date   HGBA1C 5.1 07/26/2017   Lab Results  Component Value Date   INSULIN 14.6 07/26/2017   CBC    Component Value Date/Time   WBC 8.2 06/02/2017 0945   RBC 4.78 06/02/2017 0945   HGB 14.5 06/02/2017 0945   HCT 44.3 06/02/2017 0945   PLT 323 06/02/2017 0945   MCV 93 06/02/2017 0945   MCH 30.3 06/02/2017 0945   MCHC 32.7 06/02/2017 0945   RDW 12.3 06/02/2017 0945   LYMPHSABS 2.4 06/02/2017 0945   EOSABS 0.2 06/02/2017 0945   BASOSABS 0.0 06/02/2017 0945   Iron/TIBC/Ferritin/ %Sat No results found for: IRON, TIBC, FERRITIN, IRONPCTSAT Lipid Panel     Component Value Date/Time   CHOL 250 (H) 07/26/2017 1127   TRIG 158 (H) 07/26/2017 1127   HDL 55 07/26/2017 1127   CHOLHDL 5.0 (H) 06/02/2017 0945   CHOLHDL 5.1 03/03/2014 1208   VLDL 41 (H) 03/03/2014 1208   LDLCALC 163 (H) 07/26/2017 1127   Hepatic Function Panel     Component Value Date/Time   PROT 7.3 06/02/2017 0945   ALBUMIN 4.5 06/02/2017 0945   AST 14 06/02/2017 0945   ALT 17 06/02/2017 0945   ALKPHOS 66 06/02/2017 0945   BILITOT 0.4 06/02/2017 0945   BILIDIR 0.12 06/02/2017 0945      Component Value Date/Time   TSH 1.850 07/26/2017 1127   Results for Alison ChafeGARCIA, Adrijana M (MRN [id number]) as of 11/27/2017 16:36  Ref. Range 07/26/2017 11:27  Vitamin D, 25-Hydroxy Latest Ref Range: 30.0 - 100.0 ng/mL 14.7 (L)   ASSESSMENT AND PLAN: Vitamin D deficiency - Plan: Vitamin D, Ergocalciferol, (DRISDOL) 50000 units CAPS capsule  Other insomnia  At risk for osteoporosis  Class 3 severe obesity with serious comorbidity and body mass index (BMI) of 40.0 to 44.9 in adult, unspecified obesity type (HCC)  PLAN:  Vitamin D Deficiency Alison Davies was informed that low vitamin D levels contributes to fatigue and are associated with obesity, breast, and colon cancer. She agrees to continue  to take prescription Vit D @50 ,000 IU every week #4 with no refills. We will recheck vitamin D level next month and will follow up for routine testing of vitamin D, at least 2-3 times per year. She was informed of the risk of over-replacement of vitamin D and agrees to not increase her dose unless she discusses this with us first. Alison Davies agrees to follow up as directed.  At risk for osteopenia and osteoporosis Alison Davies is at risk for osteopenia and osteoporosis due to her vitamin D deficiency. She was encouraged to take her vitamin D and follow  her higher calcium diet and increase strengthening exercise to help strengthen her bones and decrease her risk of osteopenia and osteoporosis.  Insomnia Alison Davies will continue to take melatonin and follow up as directed.  Obesity Alison Davies is currently in the action stage of change. As such, her goal is to continue with weight loss efforts She has agreed to follow the Category 3 plan Alison Davies has been instructed to work up to a goal of 150 minutes of combined cardio and strengthening exercise per week for weight loss and overall health benefits. We discussed the following Behavioral Modification Strategies today: planning for success and work on meal planning and easy cooking plans  Alison Davies has agreed to follow up with our clinic in 3 to 4 weeks. She was informed of the importance of frequent follow up visits to maximize her success with intensive lifestyle modifications for her multiple health conditions.   OBESITY BEHAVIORAL INTERVENTION VISIT  Today's visit was # 9 out of 22.  Starting weight: 199 lbs Starting date: 07/26/17 Today's weight : 195 lbs Today's date: 11/23/2017 Total lbs lost to date: 4    ASK: We discussed the diagnosis of obesity with Alison Davies today and Alison Davies agreed to give Alison Davies permission to discuss obesity behavioral modification therapy today.  ASSESS: Alison Davies has the diagnosis of obesity and her BMI today is 34.55 Alison Davies is in the  action stage of change   ADVISE: Alison Davies was educated on the multiple health risks of obesity as well as the benefit of weight loss to improve her health. She was advised of the need for long term treatment and the importance of lifestyle modifications.  AGREE: Multiple dietary modification options and treatment options were discussed and  Alison Davies agreed to the above obesity treatment plan.  Cristi Loron, am acting as transcriptionist for [name], PA I, [name], PA-C have reviewed above note and agree with its content

## 2017-11-29 NOTE — Progress Notes (Signed)
Office: [phone]  /  Fax: [phone]  Date: December 06, 2017 Time Seen: 7:55am Duration: 35 minutes  Provider: Lawerance CruelGaytri Mikaele Stecher, Psy.D. Type of Session: Individual Therapy   HPI: Dianawas referred by Dr. Debbra RidingAlexandria Kadolph. Per the note for the initial visit with Dr. Rinaldo RatelKadolph on July 26, 2017, Dianahas significant food cravings issues,wakes up frquently in the middle of the night to eat,skips meals frequently,frequentlydrinksliquids with calories,frequently makes poor food choices,frequently eats larger portions than normal, andstruggles with emotional eating. Per the note for the initial visit with this provider on October 24, 2017, Alison Davies reported emotional eating has always been an issue "even through middle school and high school." She reported an increase in emotional eating in high school paired with not eating at times as well as binge eating. Alison Davies shared she would "make" herself throw up; however, that did not last a long time. She noted the last time she made her self throw up was during senior year of high school. She indicated she will still restrict food intake at times and she continuously eats when sad or stressed. Alison Davies reported the last time she engaged in emotional eating was last week. She explained she ate a frozen pizza, bag of pizza, and two liters of soda over the course of one hour to one and a half hours. She shared the aforementioned occurs once or twice a month.Per the note for the initial visit with Dr. Rinaldo RatelKadolph on July 26, 2017, [name] gaining weight 1st year of collegeandherheaviest weight ever was 210pounds.  Session Content: Session focused on the goals of decreasing emotional eating and increasing self-compassion. The session was initiated with the administration of the PHQ-9 and GAD-7, as well as a a brief check-in. Alison Davies reported she started Melatonin and has been incorporating various sleep hygiene strategies discussed during the last appointment  (e.g., not watching television in bed or reading in bed). She noted, "That's been working."  Alison Davies further shared she has been "picking up more work" as she obtained a second job. Session then focused on discussing the impact of more work on her eating habits as stress and fatigue have been triggers for emotional eating for Alison Davies in the past. Alison Davies reported she has missed meals, and noted she "did not do well" on the day a drug rep brought in food. Thus, this was explored further and triggers were reviewed. Alison Davies demonstrated difficulty identifying triggers and disclosed she has been engaging in meal prepping and also went to the gym. To assist in increasing self-compassion, the discrepancy between the distorted thinking (I.e., all or nothing) and the actual evidence was presented to Alison Davies. The remainder of session focused on engaging Alison Davies in a thought challenging exercise, which included her identifying evidence for and evidence against the all or nothing thought that she shared related to following her meal plan and associated protein and calorie goals.   Alison Davies was receptive to today's session as evidenced by engagement in reciprocal conversation, asking questions for clarification, and engagement in the thought challenging exercise. She also demonstrated willingness to practice thought challenging between now and the next appointment especially when she feels "triggered" by comments made by her mother and cousin.   Mental Status Examination: Alison Davies arrived early for the appointment; therefore, the appointment was initiated early. She presented as appropriately dressed and groomed. Alison Davies appeared her stated age and demonstrated adequate orientation to time, place, person, and purpose of the appointment. She also demonstrated appropriate eye contact. No psychomotor abnormalities or behavioral peculiarities noted. Her mood  was euthymic with congruent affect. Her thought processes were logical, linear, and  goal-directed. No hallucinations, delusions, bizarre thinking or behavior reported or observed. Judgment, insight, and impulse control appeared to be grossly intact. There was no evidence of paraphasias (i.e., errors in speech, gross mispronunciations, and word substitutions), repetition deficits, or disturbances in volume or prosody (i.e., rhythm and intonation). There was no evidence of attention or memory impairments. Margrit denied current suicidal and homicidal ideation, intent or plan.  Structured Assessment Results:The Patient Health Questionnaire-9 (PHQ-9) is a self-report measure that assesses symptoms and severity of depression over the course of the last two weeks. Skyelynn obtained a score of 4 suggesting minimal depression.Dejanique finds the endorsed symptoms to be somewhat difficult.  Depression screen PHQ 2/9 12/06/2017  Decreased Interest 1  Down, Depressed, Hopeless 1  PHQ - 2 Score 2  Altered sleeping 1  Tired, decreased energy 1  Change in appetite 0  Feeling bad or failure about yourself  0  Trouble concentrating 0  Moving slowly or fidgety/restless 0  Suicidal thoughts -  PHQ-9 Score 4  Difficult doing work/chores -   The Generalized Anxiety Disorder-7 (GAD-7) is a brief self-report measure that assesses symptoms of anxiety over the course of the last two weeks. Minela obtained a score of 3 suggesting minimal anxiety.  GAD 7 : Generalized Anxiety Score 12/06/2017 11/23/2017 10/24/2017  Nervous, Anxious, on Edge 0 1 2  Control/stop worrying 0 0 2  Worry too much - different things 1 0 2  Trouble relaxing 1 0 1  Restless 1 1 1   Easily annoyed or irritable 0 2 2  Afraid - awful might happen 0 0 0  Total GAD 7 Score 3 4 10   Anxiety Difficulty Somewhat difficult Somewhat difficult Somewhat difficult   Interventions: Carleigh was administered the PHQ-9 and GAD-7 for symptom monitoring. Content from the last session was reviewed (I.e., sleep hygiene and triggers for emotional eating).  Throughout today's session, empathic reflections and validation were provided. Psychoeducation regarding thought challenging was provided. Bernedette was led through a thought challenging exercise.   DSM-5 Diagnoses: 311 (F32.8) Other Specified Depressive Disorder, Emotional Eating and 300.02 (F41.1) Generalized Anxiety Disorder (per history)  Plan: Shakyra continues to appear able and willing to participate as evidenced by engagement in reciprocal conversation, engagement in the thought challenging exercise, and asking questions for clarification as appropriate. The next appointment will be scheduled in two weeks. The next session will focus on reviewing learned skills, and working towards established treatment goals. The next appointment will also focus on upcoming termination.

## 2017-11-30 ENCOUNTER — Ambulatory Visit: Payer: 59 | Admitting: Family Medicine

## 2017-12-06 ENCOUNTER — Ambulatory Visit (INDEPENDENT_AMBULATORY_CARE_PROVIDER_SITE_OTHER): Payer: 59 | Admitting: Psychology

## 2017-12-06 DIAGNOSIS — F3289 Other specified depressive episodes: Secondary | ICD-10-CM | POA: Diagnosis not present

## 2017-12-06 NOTE — Progress Notes (Signed)
Office: [phone]  /  Fax: [phone]  Date: December 20, 2017 Time Seen: 8:00am Duration: 32 minutes Provider: Lawerance Cruel, Psy.D. Type of Session: Individual Therapy   HPI: Alison Davies referred by Dr. Debbra Riding. Per the note for the initial visit with Dr. Rinaldo Ratel on July 26, 2017, Dianahas significant food cravings issues,wakes up frquently in the middle of the night to eat,skips meals frequently,frequentlydrinksliquids with calories,frequently makes poor food choices,frequently eats larger portions than normal, andstruggles with emotional eating. Perthe note for the initial visit with this provider on October 24, 2017,Alison Davies reportedemotional eating has always been an issue "even through middle school and high school." She reported an increase in emotional eating in high school paired with not eating at times as well as binge eating. Alison Davies shared she would "make" herself throw up; however, that did not last a long time. She noted the last time she made her self throw up was during senior year of high school. She indicated she will still restrict food intake at times and she continuously eats when sad or stressed. Alison Davies reported the last time she engaged in emotional eating was last week. She explained she ate a frozen pizza, bag of pizza, and two liters of soda over the course of one hour to one and a half hours. She shared the aforementioned occurs once or twice a month.Per the note for the initial visit with Dr. Rinaldo Ratel on July 26, 2017, [name] gaining weight 1st year of collegeandherheaviest weight ever was 210pounds.  Session Content: Session focused on the goalsof decreasing emotional eating and increasing self-compassion. The session was initiated with the administration of the PHQ-9 and GAD-7, as well as a a brief check-in. Alison Davies shared, she "felt better personally," but she is "irritated professionally." She explains that for the past 2-3 months there have  been ongoing issues with management; however, she is "trying to let it go." Session then focused on the impact of her work stress on eating. Due to the ongoing stressors, Alison Davies noted, she "craves salty and sweet foods." Nevertheless, to help cope, Alison Davies shared she has been leaving the office during her lunch hour and eating salty and sweet foods (e.g., chips) as part of her allotted snack claories. Moreover, session focused on "all or nothing thinking." This provider introduced Alison Davies to thought defusion. She was engaged in an exercise during session and was provided a handout to practice at home. The impact of thought defusion on emotional eating was also shared. Alison of session focused on upcoming termination. Alison Davies requested to meet with this provider for two additional sessions and noted she would like to further discuss the option of a referral for continued therapeutic services at the next appointment.   Alison Davies was receptive to today's session as evidenced by sharing about recent events, engagement in a thought defusion exercise, and willingness to continue practicing thought defusion exercises at home.  Mental Status Examination: Alison Davies arrived on time for the appointment. She presented as appropriately dressed and groomed. Alison Davies appeared her stated age and demonstrated adequate orientation to time, place, person, and purpose of the appointment. She also demonstrated appropriate eye contact. No psychomotor abnormalities or behavioral peculiarities noted. Her mood was euthymic with congruent affect. Her thought processes were logical, linear, and goal-directed. No hallucinations, delusions, bizarre thinking or behavior reported or observed. Judgment, insight, and impulse control appeared to be grossly intact. There was no evidence of paraphasias (i.e., errors in speech, gross mispronunciations, and word substitutions), repetition deficits, or disturbances in volume or prosody (  i.e., rhythm and  intonation). There was no evidence of attention or memory impairments. Alison Davies denied current suicidal and homicidal ideation, intent or plan.  Structured Assessment Results:The Patient Health Questionnaire-9 (PHQ-9) is a self-report measure that assesses symptoms and severity of depression over the course of the last two weeks. Alison Davies obtained a score of two suggesting  minimal depression. Alison Davies finds the endorsed symptoms to be somewhat difficult. Depression screen [facility] 2/9 12/20/2017  Decreased Interest 0  Down, Depressed, Hopeless 0  PHQ - 2 Score 0  Altered sleeping 1  Tired, decreased energy 1  Change in appetite 0  Feeling bad or failure about yourself  0  Trouble concentrating 0  Moving slowly or fidgety/restless 0  Suicidal thoughts 0  PHQ-9 Score 2  Difficult doing work/chores -   The Generalized Anxiety Disorder-7 (GAD-7) is a brief self-report measure that assesses symptoms of anxiety over the course of the last two weeks. Alison Davies obtained a score of four suggesting minimal anxiety. GAD 7 : Generalized Anxiety Score 12/20/2017  Nervous, Anxious, on Edge 1  Control/stop worrying 0  Worry too much - different things 1  Trouble relaxing 0  Restless 1  Easily annoyed or irritable 1  Afraid - awful might happen 0  Total GAD 7 Score 4  Anxiety Difficulty Somewhat difficult   Interventions: Alison Davies was administered the PHQ-9 and GAD-7 for symptom monitoring. Content from the last session was reviewed (I.e., all or nothing thinking and thought challenging). Throughout today's session, empathic reflections and validation were provided. Psychoeducation regarding thought defusion and subsequent impact on emotional eating was provided. Alison Davies was given a handout. Termination planning was also discussed.   DSM-5 Diagnoses: 311 (F32.8) Other Specified Depressive Disorder, Emotional Eating and 300.02 (F41.1) Generalized Anxiety Disorder (per history)  Plan: Alison Davies continues to appear able and  willing to participate as evidenced by engagement in reciprocal conversation, and asking questions for clarification as appropriate. This provider recommended the next appointment be scheduled in two weeks; however, to coordinate back to back appointments with another provider, she asked her next appointment be scheduled in three weeks. The next session will focus on reviewing learned skills, and working towards established treatment goals.

## 2017-12-07 ENCOUNTER — Ambulatory Visit (INDEPENDENT_AMBULATORY_CARE_PROVIDER_SITE_OTHER): Payer: 59 | Admitting: Family Medicine

## 2017-12-07 ENCOUNTER — Encounter: Payer: Self-pay | Admitting: Family Medicine

## 2017-12-07 ENCOUNTER — Ambulatory Visit (INDEPENDENT_AMBULATORY_CARE_PROVIDER_SITE_OTHER): Payer: Self-pay | Admitting: Psychology

## 2017-12-07 VITALS — BP 122/78 | HR 61 | Temp 98.8°F | Ht 63.0 in | Wt 197.2 lb

## 2017-12-07 DIAGNOSIS — M545 Low back pain, unspecified: Secondary | ICD-10-CM

## 2017-12-07 DIAGNOSIS — G43709 Chronic migraine without aura, not intractable, without status migrainosus: Secondary | ICD-10-CM | POA: Diagnosis not present

## 2017-12-07 MED ORDER — TOPIRAMATE 100 MG PO TABS: 100.0000 mg | ORAL_TABLET | Freq: Every day | ORAL | 2 refills | Status: DC

## 2017-12-07 NOTE — Progress Notes (Signed)
Subjective:   Patient ID: Alison Davies, female    DOB: 01-03-1986, 32 y.o.   MRN: [id number]  Alison Davies is a pleasant 32 y.o. year old female who presents to clinic today with Follow-up (Patient is here today to F/U with migraines.  On 6.6.19 pt was Tx for chronic migraines and given Rx's for Sumatriptan 25mg  and Topiramate 50mg  1-2qhs and asked to keep a H/A journal.  She has only had 2 Migraines 54 days since last one.  Triggers: Insomnia-4; small rebound H/A-4; MSG-2; Soda-1; Soft Cheese-1; Artificial Sweetener-1; Strong smell-1.  She states that the increase in Topamax really helped.) and Back Pain (Patient is also C/O LBP.  She states that it is just like when it was pulled before.  On Friday she was walking the dog and he went one way and she went another.  Pain is a 4 on a 10 point scale.  Denies any radiating. She has Tx with heat and Aleve on 1st day then stretching.  )  on 12/07/2017  HPI:  Here to follow up migraines.  I last saw her on 10/05/17 for this complaint.  Note reviewed. At that time, she was having a significant increase in incidence of migraines.  She was having 2 per day for the month prior.    We therefore restarted Topamax to 50-100 mg nightly.  Imitrex started as needed for abortive therapy.  Also advised to STOP taking NSAIDs and to keep a headache journal.  Today she is here to follow this up.  She has only had 2 migraines and it has been 54 days since her last one.  While keep her journal, she learned that insomnia, MSG, soda, soft cheese, smells and artificial sweeter are her potential migraine triggers.  She has having some low back pain today.  6 days ago, was walking the dog when he pull her one way.  Pain is 4/10, intermittent.  No radiation.  Heat and alleve, stretches have helped some. She feels topamax 100 mg nightly has really helped. Current Outpatient Medications on File Prior to Visit  Medication Sig Dispense Refill  . ALPRAZolam (XANAX) 0.25 MG  tablet Take 0.25 mg by mouth as needed for anxiety.    . Biotin 3 MG TABS Take 2 tablets by mouth daily.    . Cyanocobalamin (VITAMIN B-12 CR) 1500 MCG TBCR Take 2 tablets by mouth 2 (two) times daily.    . Melatonin 5 MG CHEW Chew 2 capsules by mouth at bedtime as needed.    . Multiple Vitamin (MULTIVITAMIN WITH MINERALS) TABS tablet Take 1 tablet by mouth daily.    . norethindrone (MICRONOR,CAMILA,ERRIN) 0.35 MG tablet Take 1 tablet (0.35 mg total) by mouth daily. 1 Package 11  . sertraline (ZOLOFT) 50 MG tablet Take 1 tablet (50 mg total) by mouth daily. 30 tablet 5  . SUMAtriptan (IMITREX) 25 MG tablet Take 1 tablet (25 mg total) by mouth every 2 (two) hours as needed for migraine. May repeat in 2 hours if headache persists or recurs. 10 tablet 2  . topiramate (TOPAMAX) 50 MG tablet 1- 2 tablets night for migraines (Patient taking differently: Take 100 mg by mouth at bedtime. ) 60 tablet 2  . vitamin C (ASCORBIC ACID) 250 MG tablet Take 250 mg by mouth daily.    . Vitamin D, Ergocalciferol, (DRISDOL) 50000 units CAPS capsule Take 1 capsule (50,000 Units total) by mouth every 7 (seven) days. 4 capsule 0   No current facility-administered  medications on file prior to visit.     No Known Allergies  Past Medical History:  Diagnosis Date  . Anxiety   . Hyperlipidemia   . Leg edema   . Migraines     No past surgical history on file.  Family History  Problem Relation Age of Onset  . Anxiety disorder Mother   . Diabetes Father   . Obesity Father     Social History   Socioeconomic History  . Marital status: Single    Spouse name: Not on file  . Number of children: Not on file  . Years of education: Not on file  . Highest education level: Not on file  Occupational History  . Occupation: [employer] manager: [employer]  Social Needs  . Financial resource strain: Not on file  . Food insecurity:    Worry: Not on file    Inability: Not on file  . Transportation needs:     Medical: Not on file    Non-medical: Not on file  Tobacco Use  . Smoking status: Never Smoker  . Smokeless tobacco: Never Used  Substance and Sexual Activity  . Alcohol use: Not on file  . Drug use: Not on file  . Sexual activity: Not on file  Lifestyle  . Physical activity:    Days per week: Not on file    Minutes per session: Not on file  . Stress: Not on file  Relationships  . Social connections:    Talks on phone: Not on file    Gets together: Not on file    Attends religious service: Not on file    Active member of club or organization: Not on file    Attends meetings of clubs or organizations: Not on file    Relationship status: Not on file  . Intimate partner violence:    Fear of current or ex partner: Not on file    Emotionally abused: Not on file    Physically abused: Not on file    Forced sexual activity: Not on file  Other Topics Concern  . Not on file  Social History Narrative  . Not on file   The PMH, PSH, Social History, Family History, Medications, and allergies have been reviewed in [facility], and have been updated if relevant.   Review of Systems  Constitutional: Negative.   Respiratory: Negative.   Cardiovascular: Negative.   Genitourinary: Negative.   Musculoskeletal: Positive for back pain. Negative for gait problem, joint swelling, myalgias, neck pain and neck stiffness.  Neurological: Negative for dizziness, tremors, seizures, syncope, facial asymmetry, speech difficulty, weakness, light-headedness, numbness and headaches.  Psychiatric/Behavioral: Negative.   All other systems reviewed and are negative.      Objective:    BP 122/78 (BP Location: Left Arm, Patient Position: Sitting, Cuff Size: Normal)   Pulse 61   Temp 98.8 F (37.1 C) (Oral)   Ht 5\' 3"  (1.6 m)   Wt 197 lb 3.2 oz (89.4 kg)   LMP 07/27/2017   SpO2 96%   BMI 34.93 kg/m   Wt Readings from Last 3 Encounters:  12/07/17 197 lb 3.2 oz (89.4 kg)  11/23/17 195 lb (88.5 kg)    10/24/17 197 lb (89.4 kg)     Physical Exam  Constitutional: She is oriented to person, place, and time. She appears well-developed and well-nourished. No distress.  HENT:  Head: Normocephalic and atraumatic.  Eyes: EOM are normal.  Neck: Normal range of motion.  Cardiovascular:  Normal rate and regular rhythm.  Pulmonary/Chest: Effort normal and breath sounds normal.  Musculoskeletal: Normal range of motion. She exhibits no edema.       Lumbar back: She exhibits spasm. She exhibits normal range of motion, no tenderness, no bony tenderness, no swelling, no edema, no deformity, no laceration, no pain and normal pulse.  SLR neg bilaterally  Neurological: She is alert and oriented to person, place, and time. No cranial nerve deficit.  Skin: Skin is warm and dry. She is not diaphoretic.  Psychiatric: She has a normal mood and affect. Her behavior is normal. Judgment and thought content normal.  Nursing note and vitals reviewed.         Assessment & Plan:   Chronic migraine without aura without status migrainosus, not intractable No follow-ups on file.

## 2017-12-07 NOTE — Assessment & Plan Note (Signed)
New-  Lumbar spasm without signs of herniation/radiculopathy.  For acute pain, rest, intermittent application of heat (do not sleep on heating pad), analgesics and muscle relaxants are recommended. Discussed longer term treatment plan of prn NSAID's and discussed a home back care exercise program with flexion exercise routine. Proper lifting with avoidance of heavy lifting discussed. Consider Physical Therapy and XRay studies if not improving. Call or return to clinic prn if these symptoms worsen or fail to improve as anticipated.

## 2017-12-07 NOTE — Assessment & Plan Note (Signed)
Improved controlled with increased dose of Topamax for prevention. Continue at current dose- eRx sent. Continue imitrex as needed for abortive therapy. Continue HA journal. Call or return to clinic prn if these symptoms worsen or fail to improve as anticipated. The patient indicates understanding of these issues and agrees with the plan.

## 2017-12-19 ENCOUNTER — Ambulatory Visit (INDEPENDENT_AMBULATORY_CARE_PROVIDER_SITE_OTHER): Payer: 59 | Admitting: Physician Assistant

## 2017-12-19 VITALS — BP 108/72 | HR 69 | Temp 97.6°F | Ht 63.0 in | Wt 191.0 lb

## 2017-12-19 DIAGNOSIS — E669 Obesity, unspecified: Secondary | ICD-10-CM | POA: Diagnosis not present

## 2017-12-19 DIAGNOSIS — Z9189 Other specified personal risk factors, not elsewhere classified: Secondary | ICD-10-CM

## 2017-12-19 DIAGNOSIS — Z6833 Body mass index (BMI) 33.0-33.9, adult: Secondary | ICD-10-CM

## 2017-12-19 DIAGNOSIS — E8881 Metabolic syndrome: Secondary | ICD-10-CM

## 2017-12-19 DIAGNOSIS — E7849 Other hyperlipidemia: Secondary | ICD-10-CM

## 2017-12-19 DIAGNOSIS — E559 Vitamin D deficiency, unspecified: Secondary | ICD-10-CM | POA: Diagnosis not present

## 2017-12-19 MED ORDER — VITAMIN D (ERGOCALCIFEROL) 1.25 MG (50000 UNIT) PO CAPS: 50000.0000 [IU] | ORAL_CAPSULE | ORAL | 0 refills | Status: DC

## 2017-12-19 MED FILL — TOPIRAMATE 50 MG TABLET: 50 | 30 days supply | Qty: 60 | Fill #2

## 2017-12-19 MED FILL — SERTRALINE HCL 50 MG TABLET: 50 | 30 days supply | Qty: 30 | Fill #4

## 2017-12-19 MED FILL — VIT D2 1.25 MG (50,000 UNIT: 1.25 MG | 28 days supply | Qty: 4 | Fill #0

## 2017-12-19 MED FILL — DEBLITANE 0.35 MG TABS: 0.35 | 84 days supply | Qty: 84 | Fill #1

## 2017-12-19 NOTE — Progress Notes (Signed)
Office: [phone]  /  Fax: [phone]   HPI:   Chief Complaint: OBESITY Alison Davies is here to discuss her progress with her obesity treatment plan. She is on the Category 3 plan and is following her eating plan approximately 95 % of the time. She states she is exercising 0 minutes 0 times per week. Alison Davies did very well with weight loss. She reports eating all of the protein on the plan. She stated that when she goes out to eat she is [employer] loss adjustermaking smart choices. She wants to start going to the gym.  Her weight is 191 lb (86.6 kg) today and has had a weight loss of 4 pounds over a period of 3 to 4 weeks since her last visit. She has lost 8 lbs since starting treatment with us.  Vitamin D Deficiency Alison Davies has a diagnosis of vitamin D deficiency. She is on prescription Vit D and denies nausea, vomiting or muscle weakness.  At risk for osteopenia and osteoporosis Alison Davies is at higher risk of osteopenia and osteoporosis due to vitamin D deficiency.   Insulin Resistance Alison Davies has a diagnosis of insulin resistance based on her elevated fasting insulin level >5. Although Alison Davies's blood glucose readings are still under good control, insulin resistance puts her at greater risk of metabolic syndrome and diabetes. She denies polyphagia, and last level not at goal. She is not taking metformin currently and continues to work on diet and exercise to decrease risk of diabetes.  Hyperlipidemia Alison Davies has hyperlipidemia and has been trying to improve her cholesterol levels with intensive lifestyle modification including a low saturated fat diet, exercise and weight loss. She is not on medications and denies any chest pain, claudication or myalgias.  ALLERGIES: No Known Allergies  MEDICATIONS: Current Outpatient Medications on File Prior to Visit  Medication Sig Dispense Refill  . ALPRAZolam (XANAX) 0.25 MG tablet Take 0.25 mg by mouth as needed for anxiety.    . Biotin 3 MG TABS Take 2 tablets by mouth daily.      . Cyanocobalamin (VITAMIN B-12 CR) 1500 MCG TBCR Take 2 tablets by mouth 2 (two) times daily.    . Melatonin 5 MG CHEW Chew 2 capsules by mouth at bedtime as needed.    . Multiple Vitamin (MULTIVITAMIN WITH MINERALS) TABS tablet Take 1 tablet by mouth daily.    . norethindrone (MICRONOR,CAMILA,ERRIN) 0.35 MG tablet Take 1 tablet (0.35 mg total) by mouth daily. 1 Package 11  . sertraline (ZOLOFT) 50 MG tablet Take 1 tablet (50 mg total) by mouth daily. 30 tablet 5  . SUMAtriptan (IMITREX) 25 MG tablet Take 1 tablet (25 mg total) by mouth every 2 (two) hours as needed for migraine. May repeat in 2 hours if headache persists or recurs. 10 tablet 2  . topiramate (TOPAMAX) 100 MG tablet Take 1 tablet (100 mg total) by mouth at bedtime. 30 tablet 2  . vitamin C (ASCORBIC ACID) 250 MG tablet Take 250 mg by mouth daily.     No current facility-administered medications on file prior to visit.     PAST MEDICAL HISTORY: Past Medical History:  Diagnosis Date  . Anxiety   . Hyperlipidemia   . Leg edema   . Migraines     PAST SURGICAL HISTORY: No past surgical history on file.  SOCIAL HISTORY: Social History   Tobacco Use  . Smoking status: Never Smoker  . Smokeless tobacco: Never Used  Substance Use Topics  . Alcohol use: Not on file  . Drug  use: Not on file    FAMILY HISTORY: Family History  Problem Relation Age of Onset  . Anxiety disorder Mother   . Diabetes Father   . Obesity Father     ROS: Review of Systems  Constitutional: Positive for weight loss.  Cardiovascular: Negative for chest pain and claudication.  Gastrointestinal: Negative for nausea and vomiting.  Musculoskeletal: Negative for myalgias.       Negative muscle weakness  Endo/Heme/Allergies:       Negative polyphagia    PHYSICAL EXAM: Blood pressure 108/72, pulse 69, temperature 97.6 F (36.4 C), temperature source Oral, height 5\' 3"  (1.6 Davies), weight 191 lb (86.6 kg), last menstrual period 07/27/2017, SpO2  98 %. Body mass index is 33.83 kg/Davies. Physical Exam  Constitutional: She is oriented to person, place, and time. She appears well-developed and well-nourished.  Cardiovascular: Normal rate.  Pulmonary/Chest: Effort normal.  Musculoskeletal: Normal range of motion.  Neurological: She is oriented to person, place, and time.  Skin: Skin is warm and dry.  Psychiatric: She has a normal mood and affect. Her behavior is normal.  Vitals reviewed.   RECENT LABS AND TESTS: BMET    Component Value Date/Time   NA 138 06/02/2017 0945   K 4.4 06/02/2017 0945   CL 101 06/02/2017 0945   CO2 21 06/02/2017 0945   GLUCOSE 99 06/02/2017 0945   GLUCOSE 84 03/03/2014 1208   BUN 11 06/02/2017 0945   CREATININE 0.62 06/02/2017 0945   CALCIUM 9.6 06/02/2017 0945   GFRNONAA 121 06/02/2017 0945   GFRAA 140 06/02/2017 0945   Lab Results  Component Value Date   HGBA1C 5.1 07/26/2017   Lab Results  Component Value Date   INSULIN 14.6 07/26/2017   CBC    Component Value Date/Time   WBC 8.2 06/02/2017 0945   RBC 4.78 06/02/2017 0945   HGB 14.5 06/02/2017 0945   HCT 44.3 06/02/2017 0945   PLT 323 06/02/2017 0945   MCV 93 06/02/2017 0945   MCH 30.3 06/02/2017 0945   MCHC 32.7 06/02/2017 0945   RDW 12.3 06/02/2017 0945   LYMPHSABS 2.4 06/02/2017 0945   EOSABS 0.2 06/02/2017 0945   BASOSABS 0.0 06/02/2017 0945   Iron/TIBC/Ferritin/ %Sat No results found for: IRON, TIBC, FERRITIN, IRONPCTSAT Lipid Panel     Component Value Date/Time   CHOL 250 (H) 07/26/2017 1127   TRIG 158 (H) 07/26/2017 1127   HDL 55 07/26/2017 1127   CHOLHDL 5.0 (H) 06/02/2017 0945   CHOLHDL 5.1 03/03/2014 1208   VLDL 41 (H) 03/03/2014 1208   LDLCALC 163 (H) 07/26/2017 1127   Hepatic Function Panel     Component Value Date/Time   PROT 7.3 06/02/2017 0945   ALBUMIN 4.5 06/02/2017 0945   AST 14 06/02/2017 0945   ALT 17 06/02/2017 0945   ALKPHOS 66 06/02/2017 0945   BILITOT 0.4 06/02/2017 0945   BILIDIR 0.12  06/02/2017 0945      Component Value Date/Time   TSH 1.850 07/26/2017 1127  Results for Alison Davies, Alison Davies (MRN [id number]) as of 12/19/2017 08:13  Ref. Range 07/26/2017 11:27  Vitamin D, 25-Hydroxy Latest Ref Range: 30.0 - 100.0 ng/mL 14.7 (L)    ASSESSMENT AND PLAN: Vitamin D deficiency - Plan: VITAMIN D 25 Hydroxy (Vit-D Deficiency, Fractures), Vitamin D, Ergocalciferol, (DRISDOL) 50000 units CAPS capsule  Insulin resistance - Plan: Comprehensive metabolic panel, Hemoglobin A1c, Insulin, random  Other hyperlipidemia - Plan: Lipid Panel With LDL/HDL Ratio  At risk for osteoporosis  Class 1  obesity with serious comorbidity and body mass index (BMI) of 33.0 to 33.9 in adult, unspecified obesity type  PLAN:  Vitamin D Deficiency Grissel was informed that low vitamin D levels contributes to fatigue and are associated with obesity, breast, and colon cancer. Maylene agrees to continue taking prescription Vit D @50 ,000 IU every week #4 and we will refill for 1 month. She will follow up for routine testing of vitamin D, at least 2-3 times per year. She was informed of the risk of over-replacement of vitamin D and agrees to not increase her dose unless she discusses this with [name] first. We will check labs and Megumi agrees to follow up with our clinic in 3 weeks.  At risk for osteopenia and osteoporosis Roshelle is at risk for osteopenia and osteoporsis due to her vitamin D deficiency. She was encouraged to take her vitamin D and follow her higher calcium diet and increase strengthening exercise to help strengthen her bones and decrease her risk of osteopenia and osteoporosis.  Insulin Resistance Adelyn will continue to work on weight loss, diet, exercise, and decreasing simple carbohydrates in her diet to help decrease the risk of diabetes. We dicussed metformin including benefits and risks. She was informed that eating too many simple carbohydrates or too many calories at one sitting increases the  likelihood of GI side effects. Rupa declined metformin for now and prescription was not written today. We will check labs and Patrena agrees to follow up with our clinic in 3 weeks as directed to monitor her progress.  Hyperlipidemia Cabrina was informed of the American Heart Association Guidelines emphasizing intensive lifestyle modifications as the first line treatment for hyperlipidemia. We discussed many lifestyle modifications today in depth, and Laylynn will continue to work on decreasing saturated fats such as fatty red meat, butter and many fried foods. She will also increase vegetables and lean protein in her diet and continue to work on diet, exercise, and weight loss efforts. We will check labs and Ajee agrees to follow up with our clinic in 3 weeks.  Obesity Tunisha is currently in the action stage of change. As such, her goal is to continue with weight loss efforts She has agreed to follow the Category 3 plan Etosha has been instructed to work up to a goal of 150 minutes of combined cardio and strengthening exercise per week for weight loss and overall health benefits. We discussed the following Behavioral Modification Strategies today: work on meal planning and easy cooking plans and planning for success   Minetta has agreed to follow up with our clinic in 3 weeks. She was informed of the importance of frequent follow up visits to maximize her success with intensive lifestyle modifications for her multiple health conditions.   OBESITY BEHAVIORAL INTERVENTION VISIT  Today's visit was # 8 out of 22.  Starting weight: 199 lbs Starting date: 07/26/17 Today's weight : 191 lbs Today's date: 12/19/2017 Total lbs lost to date: 8    ASK: We discussed the diagnosis of obesity with Alison Chafe today and Lashonda agreed to give [name] permission to discuss obesity behavioral modification therapy today.  ASSESS: Jumana has the diagnosis of obesity and her BMI today is 62.84 Hayzlee is in the action  stage of change   ADVISE: Taleyah was educated on the multiple health risks of obesity as well as the benefit of weight loss to improve her health. She was advised of the need for long term treatment and the importance of lifestyle modifications.  AGREE: Multiple dietary modification options and treatment options were discussed and  Katieann agreed to the above obesity treatment plan.  Trude Mcburney, am acting as transcriptionist for [name], PA-C I, [name], PA-C have reviewed above note and agree with its content

## 2017-12-20 ENCOUNTER — Ambulatory Visit (INDEPENDENT_AMBULATORY_CARE_PROVIDER_SITE_OTHER): Payer: 59 | Admitting: Psychology

## 2017-12-20 DIAGNOSIS — F3289 Other specified depressive episodes: Secondary | ICD-10-CM | POA: Diagnosis not present

## 2017-12-20 LAB — LIPID PANEL WITH LDL/HDL RATIO
Cholesterol, Total: 200 mg/dL — ABNORMAL HIGH (ref 100–199)
HDL: 40 mg/dL (ref >39)
LDL Calculated: 132 mg/dL — ABNORMAL HIGH (ref 0–99)
LDl/HDL Ratio: 3.3 ratio — ABNORMAL HIGH (ref 0.0–3.2)
Triglycerides: 142 mg/dL (ref 0–149)
VLDL Cholesterol Cal: 28 mg/dL (ref 5–40)

## 2017-12-20 LAB — COMPREHENSIVE METABOLIC PANEL
ALT: 18 IU/L (ref 0–32)
AST: 14 IU/L (ref 0–40)
Albumin/Globulin Ratio: 1.8 (ref 1.2–2.2)
Albumin: 4.4 g/dL (ref 3.5–5.5)
Alkaline Phosphatase: 64 IU/L (ref 39–117)
BUN/Creatinine Ratio: 19 (ref 9–23)
BUN: 14 mg/dL (ref 6–20)
Bilirubin Total: 0.5 mg/dL (ref 0.0–1.2)
CO2: 20 mmol/L (ref 20–29)
Calcium: 9.7 mg/dL (ref 8.7–10.2)
Chloride: 108 mmol/L — ABNORMAL HIGH (ref 96–106)
Creatinine, Ser: 0.75 mg/dL (ref 0.57–1.00)
GFR calc Af Amer: 124 mL/min/{1.73_m2} (ref >59)
GFR calc non Af Amer: 107 mL/min/{1.73_m2} (ref >59)
Globulin, Total: 2.5 g/dL (ref 1.5–4.5)
Glucose: 100 mg/dL — ABNORMAL HIGH (ref 65–99)
Potassium: 4.4 mmol/L (ref 3.5–5.2)
Sodium: 141 mmol/L (ref 134–144)
Total Protein: 6.9 g/dL (ref 6.0–8.5)

## 2017-12-20 LAB — INSULIN, RANDOM: INSULIN: 18 u[IU]/mL (ref 2.6–24.9)

## 2017-12-20 LAB — VITAMIN D 25 HYDROXY (VIT D DEFICIENCY, FRACTURES): Vit D, 25-Hydroxy: 28.5 ng/mL — ABNORMAL LOW (ref 30.0–100.0)

## 2017-12-20 LAB — HEMOGLOBIN A1C
Est. average glucose Bld gHb Est-mCnc: 88 mg/dL
Hgb A1c MFr Bld: 4.7 % — ABNORMAL LOW (ref 4.8–5.6)

## 2018-01-09 NOTE — Progress Notes (Addendum)
Office: [phone]  /  Fax: [phone]   Date: January 10, 2018 Time Seen: 8:20am Duration: 35 minutes Provider: Lawerance Cruel, Psy.D. Type of Session: Individual Therapy   HPI: Nhyira was referred by Dr. Debbra Riding and she was seen for an initial appointment by this provider on October 24, 2017. Per the note for the initial visit with Dr. Rinaldo Ratel on July 26, 2017, Jo-Ann reported experiencing the following: significant food cravings issues, waking up frquently in the middle of the night to eat, skipping meals frequently, frequently drinking liquids with calories, frequently making poor food choices, frequently eating larger portions than normal, and struggling with emotional eating. In addition, the note indicated Cereniti started gaining weight first year of college and herheaviest weight ever was 210pounds. During Jera's initial appointment with this provider, she shared emotional eating has always been an issue "even through middle school and high school." She reported an increase in emotional eating in high school paired with not eating at times as well as binge eating. Khalie reported she would "make" herself throw up; however, that did not last a long time. She noted the last time she made her self throw up was during senior year of high school. She indicated she will still restrict food intake at times and she continuously eats when sad or stressed. During the initial appointment with this provider, Daanya disclosed the last time she engaged in emotional eating was the week prior during which she consumed a frozen pizza, bag of pizza rolls, and two liters of soda over the course of one hour to one and a half hours. She explained the aforementioned occurred once or twice a month.    Session Content: Session focused on the following treatment goals: decrease emotional eating and increase self-compassion. The session was initiated with the administration of the PHQ-9 and GAD-7, as well as  a brief check-in. Information concerning the practice, financial arrangements, and confidentiality and patients' rights were discussed during the initial appointment; however, per this provider's new office policy, Rozell was asked to sign a service agreement related to the aforementioned. The agreement was reviewed with, and signed by, Lafonda Mosses. Changes in PHQ-9 and GAD-7 scores were discussed. Yelena attributed the current sleep difficulties and fatigue to her recently starting to date as she has "broken the rules of having [her] phone while in bed." She also shared she lost three pounds since her last appointment and noted she is "proud" of herself. Regarding eating, Syliva shared she has started to engage in meal prepping for dinner as fatigue was identified as a trigger for emotional eating specifically after work. She further shared, "I'm actually liking cooking." In addition, Sharna discussed recently recognizing that if she becomes "bored" with certain foods or flavors, she tends to crave "greasy foods." Additionally, she indicated she often finds on the weekends that she wants to consume "bad" and "unhealthy" foods such as burgers and pizza. As such, remainder of session focused on cognitive distortions. A handout was provided to Magdalynn outlining various distortions and examples. Session then focused on utilization of thought defusion as a coping skill. Thyra was provided handouts with thought defusion exercises. Session concluded with this provider briefly discussing termination with Ary. Cotina expressed desire to initiate services for longer term therapy, but expressed concern about her insurance benefits. Thus, this provider recommended she call her insurance company to inquire about benefits and deducible. Geeta agreed. She also expressed worry about working with a new Facilities manager. As such, this will be further explored  and processed during the next session. Overall, Jahira was receptive to today's session as  evidenced by her openness to sharing and responsiveness to feedback.   Mental Status Examination: Takishia arrived early for the appointment; therefore, the appointment was initiated early. She presented as appropriately dressed and groomed. Lezlee appeared her stated age and demonstrated adequate orientation to time, place, person, and purpose of the appointment. She also demonstrated appropriate eye contact. No psychomotor abnormalities or behavioral peculiarities noted. Her mood was euthymic with congruent affect. Her thought processes were logical, linear, and goal-directed. No hallucinations, delusions, bizarre thinking or behavior reported or observed. Judgment, insight, and impulse control appeared to be grossly intact. There was no evidence of paraphasias (i.e., errors in speech, gross mispronunciations, and word substitutions), repetition deficits, or disturbances in volume or prosody (i.e., rhythm and intonation). There was no evidence of attention or memory impairments. Allure denied current suicidal and homicidal ideation, intent or plan.  Structured Assessment Results: The Patient Health Questionnaire-9 (PHQ-9) is a self-report measure that assesses symptoms and severity of depression over the course of the last two weeks. Jamina obtained a score of two suggesting minimal depression. Bless finds the endorsed symptoms to be somewhat difficult. Depression screen [facility] 2/9 01/10/2018  Decreased Interest 0  Down, Depressed, Hopeless 0  PHQ - 2 Score 0  Altered sleeping 1  Tired, decreased energy 1  Change in appetite 0  Feeling bad or failure about yourself  0  Trouble concentrating 0  Moving slowly or fidgety/restless 0  Suicidal thoughts 0  PHQ-9 Score 2  Difficult doing work/chores -   The Generalized Anxiety Disorder-7 (GAD-7) is a brief self-report measure that assesses symptoms of anxiety over the course of the last two weeks. Areion obtained a score of zero.  GAD 7 : Generalized Anxiety  Score 01/10/2018  Nervous, Anxious, on Edge 0  Control/stop worrying 0  Worry too much - different things 0  Trouble relaxing 0  Restless 0  Easily annoyed or irritable 0  Afraid - awful might happen 0  Total GAD 7 Score 0  Anxiety Difficulty Not difficult at all   Interventions: Zurisadai was administered the PHQ-9 and GAD-7 for symptom monitoring. Content from the last session was reviewed. Throughout today's session, empathic reflections and validation were provided. Psychoeducation regarding cognitive distortions was provided. This provider also explained its impact on emotional eating and overall self-compassion. The utilization of thought defusion to cope was also reviewed. Sonrisa was provided with handouts.   DSM-5 Diagnosis: 311 (F32.8) Other Specified Depressive Disorder, Emotional Eating and 300.02 (F41.1) Generalized Anxiety Disorder (per history)  Treatment Goal & Progress: Orie was seen for an initial appointment with this provider on October 24, 2017 during which the following treatment goals were established: decrease emotional eating and increase self-compassion . Acelyn has demonstrated progress in her goal of decreasing emotional eating as evidenced by her awareness of hunger patterns, identification of triggers for emotional eating, and utilization of learned coping skills. During today's appointment, she also discussed engaging in meal prepping as fatigue is a trigger for emotional eating. She also discussed an increasing in cooking. In addition, Kellina has demonstrated progress in her goal of increasing self-compassion as evidenced by her sharing she is "proud" of herself about her recent weight loss. Moreover, engagement in thought defusion exercises to help cope with cognitive distortions assists in decreasing emotional eating and increasing self-compassion, which was discussed during today's appointment.   Plan: Yanixa continues to appear able and willing to  participate as evidenced by  engagement in reciprocal conversation, and asking questions for clarification as appropriate. Per Aracelie's request, the next appointment will be scheduled in three weeks, as that is when her follow-up appointment with [name], PA-C will be scheduled. It will also provided Shalinda with time to contact her insurance company to inquire about benefits before a referral is placed for continuation of therapeutic services. The next session will focus on reviewing learned skills, discussing progress, and termination.

## 2018-01-10 ENCOUNTER — Ambulatory Visit (INDEPENDENT_AMBULATORY_CARE_PROVIDER_SITE_OTHER): Payer: 59 | Admitting: Psychology

## 2018-01-10 ENCOUNTER — Ambulatory Visit (INDEPENDENT_AMBULATORY_CARE_PROVIDER_SITE_OTHER): Payer: 59 | Admitting: Physician Assistant

## 2018-01-10 VITALS — BP 107/69 | HR 67 | Temp 97.5°F | Ht 63.0 in | Wt 188.0 lb

## 2018-01-10 DIAGNOSIS — Z9189 Other specified personal risk factors, not elsewhere classified: Secondary | ICD-10-CM

## 2018-01-10 DIAGNOSIS — Z6833 Body mass index (BMI) 33.0-33.9, adult: Secondary | ICD-10-CM | POA: Diagnosis not present

## 2018-01-10 DIAGNOSIS — F3289 Other specified depressive episodes: Secondary | ICD-10-CM | POA: Diagnosis not present

## 2018-01-10 DIAGNOSIS — E669 Obesity, unspecified: Secondary | ICD-10-CM | POA: Diagnosis not present

## 2018-01-10 DIAGNOSIS — E559 Vitamin D deficiency, unspecified: Secondary | ICD-10-CM

## 2018-01-10 DIAGNOSIS — E8881 Metabolic syndrome: Secondary | ICD-10-CM

## 2018-01-10 MED ORDER — VITAMIN D (ERGOCALCIFEROL) 1.25 MG (50000 UNIT) PO CAPS: 50000.0000 [IU] | ORAL_CAPSULE | ORAL | 0 refills | Status: DC

## 2018-01-10 MED FILL — VIT D2 1.25 MG (50,000 UNIT: 1.25 MG | 28 days supply | Qty: 4 | Fill #0

## 2018-01-10 NOTE — Progress Notes (Signed)
Office: [phone]  /  Fax: [phone]   HPI:   Chief Complaint: OBESITY Alison Davies is here to discuss her progress with her obesity treatment plan. She is on the Category 3 plan and is following her eating plan approximately 90 % of the time. She states she is doing weights, elliptical and walking 45 minutes 3 times per week. Alison Davies did very well with weight loss. She reports doing a better job with meal planning. Alison Davies would like more ideas to make dinner more interesting. Her weight is 188 lb (85.3 kg) today and has had a weight loss of 3 pounds over a period of 3 weeks since her last visit. She has lost 11 lbs since starting treatment with [name].  Vitamin D deficiency Alison Davies has a diagnosis of vitamin D deficiency. Alison Davies is currently taking prescription vit D and she denies nausea, vomiting or muscle weakness.  At risk for osteopenia and osteoporosis Alison Davies is at higher risk of osteopenia and osteoporosis due to vitamin D deficiency.   Insulin Resistance Alison Davies has a diagnosis of insulin resistance based on her elevated fasting insulin level >5. Although Alison Davies's blood glucose readings are still under good control, insulin resistance puts her at greater risk of metabolic syndrome and diabetes. She is not taking metformin currently and continues to work on diet and exercise to decrease risk of diabetes.  ALLERGIES: No Known Allergies  MEDICATIONS: Current Outpatient Medications on File Prior to Visit  Medication Sig Dispense Refill  . ALPRAZolam (XANAX) 0.25 MG tablet Take 0.25 mg by mouth as needed for anxiety.    . Biotin 3 MG TABS Take 2 tablets by mouth daily.    . Cyanocobalamin (VITAMIN B-12 CR) 1500 MCG TBCR Take 2 tablets by mouth 2 (two) times daily.    . Melatonin 5 MG CHEW Chew 2 capsules by mouth at bedtime as needed.    . Multiple Vitamin (MULTIVITAMIN WITH MINERALS) TABS tablet Take 1 tablet by mouth daily.    . norethindrone (MICRONOR,CAMILA,ERRIN) 0.35 MG tablet Take 1  tablet (0.35 mg total) by mouth daily. 1 Package 11  . sertraline (ZOLOFT) 50 MG tablet Take 1 tablet (50 mg total) by mouth daily. 30 tablet 5  . SUMAtriptan (IMITREX) 25 MG tablet Take 1 tablet (25 mg total) by mouth every 2 (two) hours as needed for migraine. Alison Davies repeat in 2 hours if headache persists or recurs. 10 tablet 2  . topiramate (TOPAMAX) 100 MG tablet Take 1 tablet (100 mg total) by mouth at bedtime. 30 tablet 2  . vitamin C (ASCORBIC ACID) 250 MG tablet Take 250 mg by mouth daily.     No current facility-administered medications on file prior to visit.     PAST MEDICAL HISTORY: Past Medical History:  Diagnosis Date  . Anxiety   . Hyperlipidemia   . Leg edema   . Migraines     PAST SURGICAL HISTORY: No past surgical history on file.  SOCIAL HISTORY: Social History   Tobacco Use  . Smoking status: Never Smoker  . Smokeless tobacco: Never Used  Substance Use Topics  . Alcohol use: Not on file  . Drug use: Not on file    FAMILY HISTORY: Family History  Problem Relation Age of Onset  . Anxiety disorder Mother   . Diabetes Father   . Obesity Father     ROS: Review of Systems  Constitutional: Positive for weight loss.  Gastrointestinal: Negative for nausea and vomiting.  Musculoskeletal:  Negative for muscle weakness    PHYSICAL EXAM: Blood pressure 107/69, pulse 67, temperature (!) 97.5 F (36.4 C), temperature source Oral, height 5\' 3"  (1.6 m), weight 188 lb (85.3 kg), SpO2 99 %. Body mass index is 33.3 kg/m. Physical Exam  Constitutional: She is oriented to person, place, and time. She appears well-developed and well-nourished.  Cardiovascular: Normal rate.  Pulmonary/Chest: Effort normal.  Musculoskeletal: Normal range of motion.  Neurological: She is oriented to person, place, and time.  Skin: Skin is warm and dry.  Psychiatric: She has a normal mood and affect. Her behavior is normal.  Vitals reviewed.   RECENT LABS AND TESTS: BMET     Component Value Date/Time   NA 141 12/19/2017 0806   K 4.4 12/19/2017 0806   CL 108 (H) 12/19/2017 0806   CO2 20 12/19/2017 0806   GLUCOSE 100 (H) 12/19/2017 0806   GLUCOSE 84 03/03/2014 1208   BUN 14 12/19/2017 0806   CREATININE 0.75 12/19/2017 0806   CALCIUM 9.7 12/19/2017 0806   GFRNONAA 107 12/19/2017 0806   GFRAA 124 12/19/2017 0806   Lab Results  Component Value Date   HGBA1C 4.7 (L) 12/19/2017   HGBA1C 5.1 07/26/2017   Lab Results  Component Value Date   INSULIN 18.0 12/19/2017   INSULIN 14.6 07/26/2017   CBC    Component Value Date/Time   WBC 8.2 06/02/2017 0945   RBC 4.78 06/02/2017 0945   HGB 14.5 06/02/2017 0945   HCT 44.3 06/02/2017 0945   PLT 323 06/02/2017 0945   MCV 93 06/02/2017 0945   MCH 30.3 06/02/2017 0945   MCHC 32.7 06/02/2017 0945   RDW 12.3 06/02/2017 0945   LYMPHSABS 2.4 06/02/2017 0945   EOSABS 0.2 06/02/2017 0945   BASOSABS 0.0 06/02/2017 0945   Iron/TIBC/Ferritin/ %Sat No results found for: IRON, TIBC, FERRITIN, IRONPCTSAT Lipid Panel     Component Value Date/Time   CHOL 200 (H) 12/19/2017 0806   TRIG 142 12/19/2017 0806   HDL 40 12/19/2017 0806   CHOLHDL 5.0 (H) 06/02/2017 0945   CHOLHDL 5.1 03/03/2014 1208   VLDL 41 (H) 03/03/2014 1208   LDLCALC 132 (H) 12/19/2017 0806   Hepatic Function Panel     Component Value Date/Time   PROT 6.9 12/19/2017 0806   ALBUMIN 4.4 12/19/2017 0806   AST 14 12/19/2017 0806   ALT 18 12/19/2017 0806   ALKPHOS 64 12/19/2017 0806   BILITOT 0.5 12/19/2017 0806   BILIDIR 0.12 06/02/2017 0945      Component Value Date/Time   TSH 1.850 07/26/2017 1127   Results for KAESHA, RISSER (MRN [id number]) as of 01/10/2018 10:43  Ref. Range 12/19/2017 08:06  Vitamin D, 25-Hydroxy Latest Ref Range: 30.0 - 100.0 ng/mL 28.5 (L)   ASSESSMENT AND PLAN: Vitamin D deficiency - Plan: Vitamin D, Ergocalciferol, (DRISDOL) 50000 units CAPS capsule  Insulin resistance  At risk for osteoporosis  Class 1  obesity with serious comorbidity and body mass index (BMI) of 33.0 to 33.9 in adult, unspecified obesity type  PLAN:  Vitamin D Deficiency Alison Davies was informed that low vitamin D levels contributes to fatigue and are associated with obesity, breast, and colon cancer. She agrees to continue to take prescription Vit D @50 ,000 IU every week #4 with no refills and will follow up for routine testing of vitamin D, at least 2-3 times per year. She was informed of the risk of over-replacement of vitamin D and agrees to not increase her dose unless she discusses  this with [name] first. Sharlie agrees to follow up as directed.  At risk for osteopenia and osteoporosis Alison Davies was given extended  (15 minutes) osteoporosis prevention counseling today. Alison Davies is at risk for osteopenia and osteoporosis due to her vitamin D deficiency. She was encouraged to take her vitamin D and follow her higher calcium diet and increase strengthening exercise to help strengthen her bones and decrease her risk of osteopenia and osteoporosis.  Insulin Resistance Alison Davies will continue to work on weight loss, exercise, and decreasing simple carbohydrates in her diet to help decrease the risk of diabetes.  She was informed that eating too many simple carbohydrates or too many calories at one sitting increases the likelihood of GI side effects. Alison Davies agreed to follow up with [name] as directed to monitor her progress.  Obesity Alison Davies is currently in the action stage of change. As such, her goal is to continue with weight loss efforts She has agreed to follow the Category 3 plan Alison Davies has been instructed to work up to a goal of 150 minutes of combined cardio and strengthening exercise per week for weight loss and overall health benefits. We discussed the following Behavioral Modification Strategies today: work on meal planning and easy cooking plans and ways to avoid boredom eating  Alison Davies has agreed to follow up with our clinic in 3 weeks. She was  informed of the importance of frequent follow up visits to maximize her success with intensive lifestyle modifications for her multiple health conditions.   OBESITY BEHAVIORAL INTERVENTION VISIT  Today's visit was # 9   Starting weight: 199 lbs Starting date: 07/26/17 Today's weight : 188 lbs Today's date: 01/10/2018 Total lbs lost to date: 11   ASK: We discussed the diagnosis of obesity with Arva Chafe today and Jakyia agreed to give [name] permission to discuss obesity behavioral modification therapy today.  ASSESS: Fedora has the diagnosis of obesity and her BMI today is 33.31 Shakiyah is in the action stage of change   ADVISE: Jeweline was educated on the multiple health risks of obesity as well as the benefit of weight loss to improve her health. She was advised of the need for long term treatment and the importance of lifestyle modifications to improve her current health and to decrease her risk of future health problems.  AGREE: Multiple dietary modification options and treatment options were discussed and  Vielka agreed to follow the recommendations documented in the above note.  ARRANGE: Alexee was educated on the importance of frequent visits to treat obesity as outlined per CMS and USPSTF guidelines and agreed to schedule her next follow up appointment today.  Cristi Loron, am acting as transcriptionist for [name], PA-C I, [name], PA-C have reviewed above note and agree with its content

## 2018-01-16 ENCOUNTER — Ambulatory Visit (INDEPENDENT_AMBULATORY_CARE_PROVIDER_SITE_OTHER): Payer: 59 | Admitting: Family Medicine

## 2018-01-16 ENCOUNTER — Ambulatory Visit (INDEPENDENT_AMBULATORY_CARE_PROVIDER_SITE_OTHER): Payer: 59

## 2018-01-16 ENCOUNTER — Encounter: Payer: Self-pay | Admitting: Family Medicine

## 2018-01-16 DIAGNOSIS — M79671 Pain in right foot: Secondary | ICD-10-CM

## 2018-01-16 NOTE — Progress Notes (Signed)
Alison Davies - 32 y.o. female MRN [id number]  Date of birth: 08/04/1985  SUBJECTIVE:  Including CC & ROS.  No chief complaint on file.   Alison ChafeDiana M Davies is a 32 y.o. female that is presenting with right foot pain.  The pain is been occurring for the past 2 months.  She has been increased the amount of exercise that she has been doing.  The pain is occurring on the plantar aspect of her right heel.  The pain is worse in the morning with the first few steps.  She denies any inciting event.  Has not tried anything for the pain.  She has tried insoles to help with cushioning in her foot.  She denies any prior history of similar symptoms.  Pain seems to be localized to this one area.    Review of Systems  Constitutional: Negative for fever.  HENT: Negative for congestion.   Respiratory: Negative for cough.   Cardiovascular: Negative for chest pain.  Gastrointestinal: Negative for abdominal distention.  Musculoskeletal: Negative for gait problem.  Skin: Negative for color change.  Neurological: Negative for weakness.  Hematological: Negative for adenopathy.  Psychiatric/Behavioral: Negative for agitation.    HISTORY: Past Medical, Surgical, Social, and Family History Reviewed & Updated per EMR.   Pertinent Historical Findings include:  Past Medical History:  Diagnosis Date  . Anxiety   . Hyperlipidemia   . Leg edema   . Migraines     No past surgical history on file.  No Known Allergies  Family History  Problem Relation Age of Onset  . Anxiety disorder Mother   . Diabetes Father   . Obesity Father      Social History   Socioeconomic History  . Marital status: Single    Spouse name: Not on file  . Number of children: Not on file  . Years of education: Not on file  . Highest education level: Not on file  Occupational History  . Occupation: [employer]    Employer: [employer]  Social Needs  . Financial resource strain: Not on file  . Food insecurity:    Worry: Not on  file    Inability: Not on file  . Transportation needs:    Medical: Not on file    Non-medical: Not on file  Tobacco Use  . Smoking status: Never Smoker  . Smokeless tobacco: Never Used  Substance and Sexual Activity  . Alcohol use: Not on file  . Drug use: Not on file  . Sexual activity: Not on file  Lifestyle  . Physical activity:    Days per week: Not on file    Minutes per session: Not on file  . Stress: Not on file  Relationships  . Social connections:    Talks on phone: Not on file    Gets together: Not on file    Attends religious service: Not on file    Active member of club or organization: Not on file    Attends meetings of clubs or organizations: Not on file    Relationship status: Not on file  . Intimate partner violence:    Fear of current or ex partner: Not on file    Emotionally abused: Not on file    Physically abused: Not on file    Forced sexual activity: Not on file  Other Topics Concern  . Not on file  Social History Narrative  . Not on file     PHYSICAL EXAM:  VS: There were  no vitals taken for this visit. Physical Exam Gen: NAD, alert, cooperative with exam, well-appearing ENT: normal lips, normal nasal mucosa,  Eye: normal EOM, normal conjunctiva and lids CV:  no edema, +2 pedal pulses   Resp: no accessory muscle use, non-labored,  Skin: no rashes, no areas of induration  Neuro: normal tone, normal sensation to touch Psych:  normal insight, alert and oriented MSK:  Right foot:  Pes planus Tenderness to palpation over the plantar calcaneus. No abnormal swelling or ecchymosis. No tenderness to palpation over the Achilles. Normal strength resistance. Normal ankle range of motion. Neurovascular intact  Limited ultrasound: Right foot:  Normal appearing achilles  No significant thickening of the plantar fascia.  No heel spurring noted  Normal posterior tib   Summary: normal exm   Ultrasound and interpretation by Clare Gandy,  MD      ASSESSMENT & PLAN:   Pain of right heel Possible be early plantar fasciitis.  Possible for fat pad syndrome. -Counseled on supportive care. -Counseled on home exercise program. -Can follow-up to try custom orthotics. -If no improvement consider imaging and injection.

## 2018-01-16 NOTE — Assessment & Plan Note (Signed)
Possible be early plantar fasciitis.  Possible for fat pad syndrome. -Counseled on supportive care. -Counseled on home exercise program. -Can follow-up to try custom orthotics. -If no improvement consider imaging and injection.

## 2018-01-18 DIAGNOSIS — N898 Other specified noninflammatory disorders of vagina: Secondary | ICD-10-CM | POA: Diagnosis not present

## 2018-01-18 DIAGNOSIS — R300 Dysuria: Secondary | ICD-10-CM | POA: Diagnosis not present

## 2018-01-18 DIAGNOSIS — Z113 Encounter for screening for infections with a predominantly sexual mode of transmission: Secondary | ICD-10-CM | POA: Diagnosis not present

## 2018-01-18 MED FILL — TOPIRAMATE 100 MG TABLET: 100 | 30 days supply | Qty: 30 | Fill #0

## 2018-01-25 NOTE — Progress Notes (Signed)
Office: [phone]  /  Fax: [phone]   Date: February 01, 2018 Time Seen: 8:30am Duration: 30 minutes Provider: Lawerance Cruel, Psy.D. Type of Session: Individual Therapy   HPI: Alison Davies was referred by Dr. Debbra Davies was seen for an initial appointment with this provider on October 24, 2017. Per the note for the initial visit with Dr. Rinaldo Davies on July 26, 2017, Alison Davies has significant food cravings issues, wakes up frquently in the middle of the night to eat, skips meals frequently, frequently drinks liquids with calories, frequently makes poor food choices, frequently eats larger portions than normal, and struggles with emotional eating. In addition, Alison Davies started gaining weight the first year of college and herheaviest weight ever was 210pounds. Moreover, during the initial appointment with this provider, Alison Davies shared emotional eating has always been an issue "even through middle school and high school." She reported an increase in emotional eating in high school paired with not eating at times as well as binge eating. Alison Davies shared she would "make" herself throw up; however, that did not last a long time. She noted the last time she made her self throw up was during senior year of high school. She indicated she will still restrict food intake at times and she continuously eats when sad or stressed. Alison Davies reported the last time she engaged in emotional eating was a week prior to the initial appointment with this provider. She explained she ate a frozen pizza, bag of pizza rolls, and two liters of soda over the course of one hour to one and a half hours. She shared the aforementioned occurs once or twice a month.   Session Content: This provider broke the glass with Alison Davies's permission. Session focused on the following treatment goals: decrease emotional eating and increase self-compassion . The session was initiated with the administration of the PHQ-9 and GAD-7, as well as a brief check-in.  Changes in PHQ-9 and GAD-7 scores were discussed. Alison Davies stated the increase in PHQ-9 scores was secondary to interpersonal relationship difficulties. She discussed journaling to help cope. This provider recommended utilizing thought defusion. Alison Davies agreed. In addition, Alison Davies indicated she continues to experience "body image issues." She also discussed engaging in emotional eating secondary to the abovementioned and acknowledged she began experiencing symptoms of depression. To assist in coping, Alison Davies stated she took today the day off and disucssed various pleasurable activities she plans to engage in. Session then focused on problem solving as it relates to exercise, as Alison Davies shared she does not enjoy cardio. Alison Davies identified hiking as an option that she would enjoy in order to achieve her cardio goals. Alison Davies of session focused on termination. Alison Davies shared she called her insurance company to determine benefits in order to continue longer term-therapeutic services. She expressed desire to continue therapeutic services and was receptive to a referral. Alison Davies was receptive to today's session as evidenced by her openness to sharing and responsiveness to feedback. She also demonstrated understanding of the benefits of continued therapeutic services.  Mental Status Examination: Alison Davies arrived early for the appointment. She presented as appropriately dressed and groomed. Alison Davies appeared her stated age and demonstrated adequate orientation to time, place, person, and purpose of the appointment. She also demonstrated appropriate eye contact. No psychomotor abnormalities or behavioral peculiarities noted. Her mood was euthymic with congruent affect. Her thought processes were logical, linear, and goal-directed. No hallucinations, delusions, bizarre thinking or behavior reported or observed. Judgment, insight, and impulse control appeared to be grossly intact. There was no evidence of paraphasias (  i.e., errors in speech,  gross mispronunciations, and word substitutions), repetition deficits, or disturbances in volume or prosody (i.e., rhythm and intonation). There was no evidence of attention or memory impairments. Alison Davies denied current suicidal and homicidal ideation, intent or plan.  Structured Assessment Results: The Patient Health Questionnaire-9 (PHQ-9) is a self-report measure that assesses symptoms and severity of depression over the course of the last two weeks. Alison Davies obtained a score of four suggesting minimal depression. Alison Davies finds the endorsed symptoms to be not difficult at all. Depression screen PHQ 2/9 02/01/2018  Decreased Interest 1  Down, Depressed, Hopeless 1  PHQ - 2 Score 2  Altered sleeping 1  Tired, decreased energy 0  Change in appetite 1  Feeling bad or failure about yourself  0  Trouble concentrating 0  Moving slowly or fidgety/restless 0  Suicidal thoughts 0  PHQ-9 Score 4  Difficult doing work/chores -   The Generalized Anxiety Disorder-7 (GAD-7) is a brief self-report measure that assesses symptoms of anxiety over the course of the last two weeks. Alison Davies obtained a score of one suggesting minimal anxiety. GAD 7 : Generalized Anxiety Score 02/01/2018  Nervous, Anxious, on Edge 0  Control/stop worrying 0  Worry too much - different things 0  Trouble relaxing 0  Restless 0  Easily annoyed or irritable 1  Afraid - awful might happen 0  Total GAD 7 Score 1  Anxiety Difficulty Not difficult at all   Interventions: Alison Davies was administered the PHQ-9 and GAD-7 for symptom monitoring.Throughout today's session, empathic reflections and validation were provided. Learned skills including thought defusion and pleasurable activities were reviewed. 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 Alison Davies has demonstrated progress in her goal of decreasing emotional eating as evidenced by Alison Davies's demonstration of understanding of emotional versus physical hunger as well as her triggers for emotional eating. She also reported utilization of learned skills as well as meal prepping. Alison Davies has demonstrated progress in her goal of increasing self compassion as evidenced by her sharing that she is proud of herself. In addition, she has demonstrated receptiveness to utilizing thought defusion to help cope with decreasing emotional eating and increasing self compassion. Nonetheless, she continues to experiences "body image issues" and would benefit from continued therapeutic services.  Plan: Alison Davies will be referred for longer term therapeutic services.

## 2018-01-26 MED FILL — SERTRALINE HCL 50 MG TABLET: 50 | 30 days supply | Qty: 30 | Fill #5

## 2018-02-01 ENCOUNTER — Ambulatory Visit (INDEPENDENT_AMBULATORY_CARE_PROVIDER_SITE_OTHER): Payer: 59 | Admitting: Psychology

## 2018-02-01 ENCOUNTER — Ambulatory Visit (INDEPENDENT_AMBULATORY_CARE_PROVIDER_SITE_OTHER): Payer: 59 | Admitting: Physician Assistant

## 2018-02-01 VITALS — BP 108/70 | HR 72 | Temp 98.0°F | Ht 63.0 in | Wt 185.0 lb

## 2018-02-01 DIAGNOSIS — E8881 Metabolic syndrome: Secondary | ICD-10-CM

## 2018-02-01 DIAGNOSIS — Z9189 Other specified personal risk factors, not elsewhere classified: Secondary | ICD-10-CM | POA: Diagnosis not present

## 2018-02-01 DIAGNOSIS — F3289 Other specified depressive episodes: Secondary | ICD-10-CM

## 2018-02-01 DIAGNOSIS — Z6832 Body mass index (BMI) 32.0-32.9, adult: Secondary | ICD-10-CM | POA: Diagnosis not present

## 2018-02-01 DIAGNOSIS — E559 Vitamin D deficiency, unspecified: Secondary | ICD-10-CM

## 2018-02-01 DIAGNOSIS — E669 Obesity, unspecified: Secondary | ICD-10-CM | POA: Diagnosis not present

## 2018-02-01 MED ORDER — VITAMIN D (ERGOCALCIFEROL) 1.25 MG (50000 UNIT) PO CAPS: 50000.0000 [IU] | ORAL_CAPSULE | ORAL | 0 refills | Status: DC

## 2018-02-01 MED FILL — VIT D2 1.25 MG (50,000 UNIT: 1.25 MG | 28 days supply | Qty: 4 | Fill #0

## 2018-02-01 NOTE — Progress Notes (Signed)
Office: [phone]  /  Fax: [phone]   HPI:   Chief Complaint: OBESITY Alison Davies is here to discuss her progress with her obesity treatment plan. She is on the Category 3 plan and is following her eating plan approximately 80 % of the time. She states she is lifting weights for 60 minutes 3 times per week and cardio for 30 minutes 2 times per week. Alison Davies did very well with weight loss. She reports that she is not getting all of her food in on the weekend. She wants to talk about meal planning. Her weight is 185 lb (83.9 kg) today and has had a weight loss of 3 pounds over a period of 3 weeks since her last visit. She has lost 14 lbs since starting treatment with [name].  Vitamin D deficiency Alison Davies has a diagnosis of vitamin D deficiency. She is currently taking prescription vit D and denies nausea, vomiting or muscle weakness.  At risk for osteopenia and osteoporosis Alison Davies is at higher risk of osteopenia and osteoporosis due to vitamin D deficiency.   Insulin Resistance Alison Davies has a diagnosis of insulin resistance based on her elevated fasting insulin level >5. Although Alison Davies's blood glucose readings are still under good control, insulin resistance puts her at greater risk of metabolic syndrome and diabetes. She is not taking metformin currently and continues to work on diet and exercise to decrease risk of diabetes. Alison Davies denies polyphagia.  ALLERGIES: No Known Allergies  MEDICATIONS: Current Outpatient Medications on File Prior to Visit  Medication Sig Dispense Refill  . ALPRAZolam (XANAX) 0.25 MG tablet Take 0.25 mg by mouth as needed for anxiety.    . Biotin 3 MG TABS Take 2 tablets by mouth daily.    . Cyanocobalamin (VITAMIN B-12 CR) 1500 MCG TBCR Take 2 tablets by mouth 2 (two) times daily.    . Melatonin 5 MG CHEW Chew 2 capsules by mouth at bedtime as needed.    . Multiple Vitamin (MULTIVITAMIN WITH MINERALS) TABS tablet Take 1 tablet by mouth daily.    . norethindrone  (MICRONOR,CAMILA,ERRIN) 0.35 MG tablet Take 1 tablet (0.35 mg total) by mouth daily. 1 Package 11  . sertraline (ZOLOFT) 50 MG tablet Take 1 tablet (50 mg total) by mouth daily. 30 tablet 5  . SUMAtriptan (IMITREX) 25 MG tablet Take 1 tablet (25 mg total) by mouth every 2 (two) hours as needed for migraine. May repeat in 2 hours if headache persists or recurs. 10 tablet 2  . topiramate (TOPAMAX) 100 MG tablet Take 1 tablet (100 mg total) by mouth at bedtime. 30 tablet 2  . vitamin C (ASCORBIC ACID) 250 MG tablet Take 250 mg by mouth daily.    . Vitamin D, Ergocalciferol, (DRISDOL) 50000 units CAPS capsule Take 1 capsule (50,000 Units total) by mouth every 7 (seven) days. 4 capsule 0   No current facility-administered medications on file prior to visit.     PAST MEDICAL HISTORY: Past Medical History:  Diagnosis Date  . Anxiety   . Hyperlipidemia   . Leg edema   . Migraines     PAST SURGICAL HISTORY: No past surgical history on file.  SOCIAL HISTORY: Social History   Tobacco Use  . Smoking status: Never Smoker  . Smokeless tobacco: Never Used  Substance Use Topics  . Alcohol use: Not on file  . Drug use: Not on file    FAMILY HISTORY: Family History  Problem Relation Age of Onset  . Anxiety disorder Mother   .  Diabetes Father   . Obesity Father     ROS: Review of Systems  Constitutional: Positive for weight loss.  Gastrointestinal: Negative for nausea and vomiting.  Musculoskeletal:       Negative for muscle weakness  Endo/Heme/Allergies:       Negative for polyphagia    PHYSICAL EXAM: Blood pressure 108/70, pulse 72, temperature 98 F (36.7 C), temperature source Oral, height 5\' 3"  (1.6 m), weight 185 lb (83.9 kg), last menstrual period 07/17/2017, SpO2 97 %. Body mass index is 32.77 kg/m. Physical Exam  Constitutional: She is oriented to person, place, and time. She appears well-developed and well-nourished.  Cardiovascular: Normal rate.  Pulmonary/Chest:  Effort normal.  Musculoskeletal: Normal range of motion.  Neurological: She is oriented to person, place, and time.  Skin: Skin is warm and dry.  Psychiatric: She has a normal mood and affect. Her behavior is normal.  Vitals reviewed.   RECENT LABS AND TESTS: BMET    Component Value Date/Time   NA 141 12/19/2017 0806   K 4.4 12/19/2017 0806   CL 108 (H) 12/19/2017 0806   CO2 20 12/19/2017 0806   GLUCOSE 100 (H) 12/19/2017 0806   GLUCOSE 84 03/03/2014 1208   BUN 14 12/19/2017 0806   CREATININE 0.75 12/19/2017 0806   CALCIUM 9.7 12/19/2017 0806   GFRNONAA 107 12/19/2017 0806   GFRAA 124 12/19/2017 0806   Lab Results  Component Value Date   HGBA1C 4.7 (L) 12/19/2017   HGBA1C 5.1 07/26/2017   Lab Results  Component Value Date   INSULIN 18.0 12/19/2017   INSULIN 14.6 07/26/2017   CBC    Component Value Date/Time   WBC 8.2 06/02/2017 0945   RBC 4.78 06/02/2017 0945   HGB 14.5 06/02/2017 0945   HCT 44.3 06/02/2017 0945   PLT 323 06/02/2017 0945   MCV 93 06/02/2017 0945   MCH 30.3 06/02/2017 0945   MCHC 32.7 06/02/2017 0945   RDW 12.3 06/02/2017 0945   LYMPHSABS 2.4 06/02/2017 0945   EOSABS 0.2 06/02/2017 0945   BASOSABS 0.0 06/02/2017 0945   Iron/TIBC/Ferritin/ %Sat No results found for: IRON, TIBC, FERRITIN, IRONPCTSAT Lipid Panel     Component Value Date/Time   CHOL 200 (H) 12/19/2017 0806   TRIG 142 12/19/2017 0806   HDL 40 12/19/2017 0806   CHOLHDL 5.0 (H) 06/02/2017 0945   CHOLHDL 5.1 03/03/2014 1208   VLDL 41 (H) 03/03/2014 1208   LDLCALC 132 (H) 12/19/2017 0806   Hepatic Function Panel     Component Value Date/Time   PROT 6.9 12/19/2017 0806   ALBUMIN 4.4 12/19/2017 0806   AST 14 12/19/2017 0806   ALT 18 12/19/2017 0806   ALKPHOS 64 12/19/2017 0806   BILITOT 0.5 12/19/2017 0806   BILIDIR 0.12 06/02/2017 0945      Component Value Date/Time   TSH 1.850 07/26/2017 1127   Results for Alison Davies (MRN [id number]) as of 02/01/2018 10:40  Ref.  Range 12/19/2017 08:06  Vitamin D, 25-Hydroxy Latest Ref Range: 30.0 - 100.0 ng/mL 28.5 (L)   ASSESSMENT AND PLAN: Vitamin D deficiency - Plan: Vitamin D, Ergocalciferol, (DRISDOL) 50000 units CAPS capsule  Insulin resistance  At risk for osteoporosis  Class 1 obesity with serious comorbidity and body mass index (BMI) of 32.0 to 32.9 in adult, unspecified obesity type  PLAN:  Vitamin D Deficiency Alison Davies was informed that low vitamin D levels contributes to fatigue and are associated with obesity, breast, and colon cancer. She agrees to continue  to take prescription Vit D @50 ,000 IU every week #4 with no refills and will follow up for routine testing of vitamin D, at least 2-3 times per year. She was informed of the risk of over-replacement of vitamin D and agrees to not increase her dose unless she discusses this with [name] first. Alison Davies agrees to follow up as directed.  At risk for osteopenia and osteoporosis Alison Davies was given extended  (15 minutes) osteoporosis prevention counseling today. Alison Davies is at risk for osteopenia and osteoporosis due to her vitamin D deficiency. She was encouraged to take her vitamin D and follow her higher calcium diet and increase strengthening exercise to help strengthen her bones and decrease her risk of osteopenia and osteoporosis.  Insulin Resistance Alison Davies will continue to work on weight loss, exercise, and decreasing simple carbohydrates in her diet to help decrease the risk of diabetes. She was informed that eating too many simple carbohydrates or too many calories at one sitting increases the likelihood of GI side effects. Alison Davies agreed to follow up with [name] as directed to monitor her progress.  Obesity Alison Davies is currently in the action stage of change. As such, her goal is to continue with weight loss efforts She has agreed to follow the Category 3 plan Alison Davies has been instructed to work up to a goal of 150 minutes of combined cardio and strengthening exercise per  week for weight loss and overall health benefits. We discussed the following Behavioral Modification Strategies today: increasing lean protein intake and work on meal planning and easy cooking plans  Alison Davies has agreed to follow up with our clinic in 3 weeks. She was informed of the importance of frequent follow up visits to maximize her success with intensive lifestyle modifications for her multiple health conditions.   OBESITY BEHAVIORAL INTERVENTION VISIT  Today's visit was # 10   Starting weight: 199 lbs Starting date: 07/26/17 Today's weight : 185 lbs  Today's date: 02/01/2018 Total lbs lost to date: 14   ASK: We discussed the diagnosis of obesity with Alison Davies today and Alison Davies agreed to give [name] permission to discuss obesity behavioral modification therapy today.  ASSESS: Alison Davies has the diagnosis of obesity and her BMI today is 32.78 Alison Davies is in the action stage of change   ADVISE: Alison Davies was educated on the multiple health risks of obesity as well as the benefit of weight loss to improve her health. She was advised of the need for long term treatment and the importance of lifestyle modifications to improve her current health and to decrease her risk of future health problems.  AGREE: Multiple dietary modification options and treatment options were discussed and  Alison Davies agreed to follow the recommendations documented in the above note.  ARRANGE: Alison Davies was educated on the importance of frequent visits to treat obesity as outlined per CMS and USPSTF guidelines and agreed to schedule her next follow up appointment today.  Cristi Loron, am acting as transcriptionist for [name], PA-C I, [name], PA-C have reviewed above note and agree with its content

## 2018-02-06 ENCOUNTER — Encounter: Payer: Self-pay | Admitting: Family Medicine

## 2018-02-06 ENCOUNTER — Ambulatory Visit: Payer: 59 | Admitting: Family Medicine

## 2018-02-06 NOTE — Progress Notes (Unsigned)
   Subjective:   Patient ID: Alison Davies, female    DOB: 11-Mar-1986, 32 y.o.   MRN: [id number]  Alison Davies is a pleasant 32 y.o. year old female who presents to clinic today with Follow-up (Patient is here today for a F/U after going to a [facility] at [facility].  She was given Fluconazole 150mg  #1 and Macrobid BID x7d.  Then on 9.25.19 she was Rx'ed Metronidazole 500mg  bid x7d for positive yeast, trich, and BV.  If labs are required will need them sent to [facility] please.)  on 02/06/2018  HPI:  Appointment cancelled.    Review of Systems     Objective:    There were no vitals taken for this visit.   Physical Exam        Assessment & Plan:   No diagnosis found. No follow-ups on file.

## 2018-02-19 ENCOUNTER — Encounter (INDEPENDENT_AMBULATORY_CARE_PROVIDER_SITE_OTHER): Payer: Self-pay

## 2018-02-22 ENCOUNTER — Ambulatory Visit (INDEPENDENT_AMBULATORY_CARE_PROVIDER_SITE_OTHER): Payer: 59 | Admitting: Physician Assistant

## 2018-02-22 ENCOUNTER — Encounter (INDEPENDENT_AMBULATORY_CARE_PROVIDER_SITE_OTHER): Payer: Self-pay | Admitting: Physician Assistant

## 2018-02-22 VITALS — BP 107/72 | HR 67 | Temp 98.2°F | Ht 63.0 in | Wt 183.0 lb

## 2018-02-22 DIAGNOSIS — E559 Vitamin D deficiency, unspecified: Secondary | ICD-10-CM | POA: Diagnosis not present

## 2018-02-22 DIAGNOSIS — E669 Obesity, unspecified: Secondary | ICD-10-CM

## 2018-02-22 DIAGNOSIS — E8881 Metabolic syndrome: Secondary | ICD-10-CM | POA: Diagnosis not present

## 2018-02-22 DIAGNOSIS — Z6832 Body mass index (BMI) 32.0-32.9, adult: Secondary | ICD-10-CM | POA: Diagnosis not present

## 2018-02-22 DIAGNOSIS — Z9189 Other specified personal risk factors, not elsewhere classified: Secondary | ICD-10-CM | POA: Diagnosis not present

## 2018-02-22 MED ORDER — VITAMIN D (ERGOCALCIFEROL) 1.25 MG (50000 UNIT) PO CAPS: 50000.0000 [IU] | ORAL_CAPSULE | ORAL | 0 refills | Status: DC

## 2018-02-23 MED FILL — VIT D2 1.25 MG (50,000 UNIT: 1.25 MG | 28 days supply | Qty: 4 | Fill #0

## 2018-02-26 NOTE — Progress Notes (Signed)
Office: [phone]  /  Fax: [phone]   HPI:   Chief Complaint: OBESITY Alison Davies is here to discuss her progress with her obesity treatment plan. She is on the Category 3 plan and is following her eating plan approximately 80 % of the time. She states she is doing cardio and orange theory for 60 minutes 1 time per week. Alison Davies did very well with weight loss. She reports that she enjoys journaling and wants to continue. She states that weekends are tougher as she allows herself to eat more off of the plan.  Her weight is 183 lb (83 kg) today and has had a weight loss of 2 pounds over a period of 3 weeks since her last visit. She has lost 15 lbs since starting treatment with [name].  Vitamin D Deficiency Alison Davies has a diagnosis of vitamin D deficiency. She is currently taking prescription Vit D and denies nausea, vomiting or muscle weakness.  At risk for osteopenia and osteoporosis Alison Davies is at higher risk of osteopenia and osteoporosis due to vitamin D deficiency.   Insulin Resistance Alison Davies has a diagnosis of insulin resistance based on her elevated fasting insulin level >5. Although Alison Davies's blood glucose readings are still under good control, insulin resistance puts her at greater risk of metabolic syndrome and diabetes. She is not taking metformin currently and continues to work on diet and exercise to decrease risk of diabetes. She denies polyphagia.  ALLERGIES: No Known Allergies  MEDICATIONS: Current Outpatient Medications on File Prior to Visit  Medication Sig Dispense Refill  . ALPRAZolam (XANAX) 0.25 MG tablet Take 0.25 mg by mouth as needed for anxiety.    . Biotin 3 MG TABS Take 2 tablets by mouth daily.    . Cyanocobalamin (VITAMIN B-12 CR) 1500 MCG TBCR Take 2 tablets by mouth 2 (two) times daily.    . Melatonin 5 MG CHEW Chew 2 capsules by mouth at bedtime as needed.    . Multiple Vitamin (MULTIVITAMIN WITH MINERALS) TABS tablet Take 1 tablet by mouth daily.    . norethindrone  (MICRONOR,CAMILA,ERRIN) 0.35 MG tablet Take 1 tablet (0.35 mg total) by mouth daily. 1 Package 11  . sertraline (ZOLOFT) 50 MG tablet Take 1 tablet (50 mg total) by mouth daily. 30 tablet 5  . SUMAtriptan (IMITREX) 25 MG tablet Take 1 tablet (25 mg total) by mouth every 2 (two) hours as needed for migraine. May repeat in 2 hours if headache persists or recurs. 10 tablet 2  . topiramate (TOPAMAX) 100 MG tablet Take 1 tablet (100 mg total) by mouth at bedtime. 30 tablet 2  . vitamin C (ASCORBIC ACID) 250 MG tablet Take 250 mg by mouth daily.     No current facility-administered medications on file prior to visit.     PAST MEDICAL HISTORY: Past Medical History:  Diagnosis Date  . Anxiety   . Hyperlipidemia   . Leg edema   . Migraines     PAST SURGICAL HISTORY: No past surgical history on file.  SOCIAL HISTORY: Social History   Tobacco Use  . Smoking status: Never Smoker  . Smokeless tobacco: Never Used  Substance Use Topics  . Alcohol use: Not on file  . Drug use: Not on file    FAMILY HISTORY: Family History  Problem Relation Age of Onset  . Anxiety disorder Mother   . Diabetes Father   . Obesity Father     ROS: Review of Systems  Constitutional: Positive for weight loss.  Gastrointestinal: Negative  for nausea and vomiting.  Musculoskeletal:       Negative muscle weakness  Endo/Heme/Allergies:       Negative polyphagia     PHYSICAL EXAM: Blood pressure 107/72, pulse 67, temperature 98.2 F (36.8 C), temperature source Oral, height 5\' 3"  (1.6 m), weight 183 lb (83 kg), SpO2 98 %. Body mass index is 32.42 kg/m. Physical Exam  Constitutional: She is oriented to person, place, and time. She appears well-developed and well-nourished.  Cardiovascular: Normal rate.  Pulmonary/Chest: Effort normal.  Musculoskeletal: Normal range of motion.  Neurological: She is oriented to person, place, and time.  Skin: Skin is warm and dry.  Psychiatric: She has a normal mood  and affect. Her behavior is normal.  Vitals reviewed.   RECENT LABS AND TESTS: BMET    Component Value Date/Time   NA 141 12/19/2017 0806   K 4.4 12/19/2017 0806   CL 108 (H) 12/19/2017 0806   CO2 20 12/19/2017 0806   GLUCOSE 100 (H) 12/19/2017 0806   GLUCOSE 84 03/03/2014 1208   BUN 14 12/19/2017 0806   CREATININE 0.75 12/19/2017 0806   CALCIUM 9.7 12/19/2017 0806   GFRNONAA 107 12/19/2017 0806   GFRAA 124 12/19/2017 0806   Lab Results  Component Value Date   HGBA1C 4.7 (L) 12/19/2017   HGBA1C 5.1 07/26/2017   Lab Results  Component Value Date   INSULIN 18.0 12/19/2017   INSULIN 14.6 07/26/2017   CBC    Component Value Date/Time   WBC 8.2 06/02/2017 0945   RBC 4.78 06/02/2017 0945   HGB 14.5 06/02/2017 0945   HCT 44.3 06/02/2017 0945   PLT 323 06/02/2017 0945   MCV 93 06/02/2017 0945   MCH 30.3 06/02/2017 0945   MCHC 32.7 06/02/2017 0945   RDW 12.3 06/02/2017 0945   LYMPHSABS 2.4 06/02/2017 0945   EOSABS 0.2 06/02/2017 0945   BASOSABS 0.0 06/02/2017 0945   Iron/TIBC/Ferritin/ %Sat No results found for: IRON, TIBC, FERRITIN, IRONPCTSAT Lipid Panel     Component Value Date/Time   CHOL 200 (H) 12/19/2017 0806   TRIG 142 12/19/2017 0806   HDL 40 12/19/2017 0806   CHOLHDL 5.0 (H) 06/02/2017 0945   CHOLHDL 5.1 03/03/2014 1208   VLDL 41 (H) 03/03/2014 1208   LDLCALC 132 (H) 12/19/2017 0806   Hepatic Function Panel     Component Value Date/Time   PROT 6.9 12/19/2017 0806   ALBUMIN 4.4 12/19/2017 0806   AST 14 12/19/2017 0806   ALT 18 12/19/2017 0806   ALKPHOS 64 12/19/2017 0806   BILITOT 0.5 12/19/2017 0806   BILIDIR 0.12 06/02/2017 0945      Component Value Date/Time   TSH 1.850 07/26/2017 1127   Results for Alison, Davies (MRN [id number]) as of 02/26/2018 09:40  Ref. Range 12/19/2017 08:06  Vitamin D, 25-Hydroxy Latest Ref Range: 30.0 - 100.0 ng/mL 28.5 (L)   ASSESSMENT AND PLAN: Vitamin D deficiency - Plan: Vitamin D, Ergocalciferol, (DRISDOL)  50000 units CAPS capsule  Insulin resistance  At risk for osteoporosis  Class 1 obesity with serious comorbidity and body mass index (BMI) of 32.0 to 32.9 in adult, unspecified obesity type  PLAN:  Vitamin D Deficiency Raelin was informed that low vitamin D levels contributes to fatigue and are associated with obesity, breast, and colon cancer. Geraldene agrees to continue taking prescription Vit D @50 ,000 IU every week #4 and we will refill for 1 month. She will follow up for routine testing of vitamin D, at least 2-3  times per year. She was informed of the risk of over-replacement of vitamin D and agrees to not increase her dose unless she discusses this with [name] first. Brylei agrees to follow up with our clinic in 3 weeks.  At risk for osteopenia and osteoporosis Ayari was given extended (15 minutes) osteoporosis prevention counseling today. Saide is at risk for osteopenia and osteoporsis due to her vitamin D deficiency. She was encouraged to take her vitamin D and follow her higher calcium diet and increase strengthening exercise to help strengthen her bones and decrease her risk of osteopenia and osteoporosis.  Insulin Resistance Trease will continue to work on weight loss, diet, exercise, and decreasing simple carbohydrates in her diet to help decrease the risk of diabetes. We dicussed metformin including benefits and risks. She was informed that eating too many simple carbohydrates or too many calories at one sitting increases the likelihood of GI side effects. Milanie declined metformin for now and prescription was not written today. Malia agreed to follow up with our clinic in 3 weeks as directed to monitor her progress.  Obesity Merilee is currently in the action stage of change. As such, her goal is to continue with weight loss efforts She has agreed to keep a food journal with 1500 calories and 95 grams of protein daily Shawntel has been instructed to work up to a goal of 150 minutes of combined  cardio and strengthening exercise per week for weight loss and overall health benefits. We discussed the following Behavioral Modification Strategies today: work on meal planning and easy cooking plans and keep a strict food journal   [name] has agreed to follow up with our clinic in 3 weeks. She was informed of the importance of frequent follow up visits to maximize her success with intensive lifestyle modifications for her multiple health conditions.   OBESITY BEHAVIORAL INTERVENTION VISIT  Today's visit was # 11  Starting weight: 199 lbs Starting date: 07/26/17 Today's weight : 183 lbs Today's date: 02/22/2018 Total lbs lost to date: 15    ASK: We discussed the diagnosis of obesity with Arva Chafe today and Mykenzi agreed to give [name] permission to discuss obesity behavioral modification therapy today.  ASSESS: Aalijah has the diagnosis of obesity and her BMI today is 32.42 Anneth is in the action stage of change   ADVISE: Amalee was educated on the multiple health risks of obesity as well as the benefit of weight loss to improve her health. She was advised of the need for long term treatment and the importance of lifestyle modifications.  AGREE: Multiple dietary modification options and treatment options were discussed and  Seletha agreed to the above obesity treatment plan.  Trude Mcburney, am acting as transcriptionist for [name], PA-C I, [name], PA-C have reviewed above note and agree with its content

## 2018-03-01 ENCOUNTER — Encounter: Payer: Self-pay | Admitting: Family Medicine

## 2018-03-02 MED ORDER — SERTRALINE HCL 50 MG PO TABS: 50.0000 mg | ORAL_TABLET | Freq: Every day | ORAL | 3 refills | Status: DC

## 2018-03-02 MED FILL — TOPIRAMATE 100 MG TABLET: 100 | 30 days supply | Qty: 30 | Fill #1

## 2018-03-02 MED FILL — NORETHINDRONE 0.35 MG TAB: 0.35 | 84 days supply | Qty: 84 | Fill #2

## 2018-03-02 MED FILL — SERTRALINE HCL 50 MG TABLET: 50 | 30 days supply | Qty: 30 | Fill #0

## 2018-03-04 ENCOUNTER — Emergency Department (HOSPITAL_BASED_OUTPATIENT_CLINIC_OR_DEPARTMENT_OTHER)
Admission: EM | Admit: 2018-03-04 | Discharge: 2018-03-04 | Disposition: A | Discharge status: Home / Self Care | Payer: 59 | Attending: Emergency Medicine | Admitting: Emergency Medicine

## 2018-03-04 ENCOUNTER — Other Ambulatory Visit: Payer: Self-pay

## 2018-03-04 ENCOUNTER — Encounter (HOSPITAL_BASED_OUTPATIENT_CLINIC_OR_DEPARTMENT_OTHER): Payer: Self-pay | Admitting: *Deleted

## 2018-03-04 DIAGNOSIS — Y93K9 Activity, other involving animal care: Secondary | ICD-10-CM | POA: Insufficient documentation

## 2018-03-04 DIAGNOSIS — Z79899 Other long term (current) drug therapy: Secondary | ICD-10-CM | POA: Insufficient documentation

## 2018-03-04 DIAGNOSIS — Y92019 Unspecified place in single-family (private) house as the place of occurrence of the external cause: Secondary | ICD-10-CM | POA: Diagnosis not present

## 2018-03-04 DIAGNOSIS — Z23 Encounter for immunization: Secondary | ICD-10-CM | POA: Insufficient documentation

## 2018-03-04 DIAGNOSIS — Y998 Other external cause status: Secondary | ICD-10-CM | POA: Insufficient documentation

## 2018-03-04 DIAGNOSIS — W540XXA Bitten by dog, initial encounter: Secondary | ICD-10-CM | POA: Insufficient documentation

## 2018-03-04 DIAGNOSIS — S0185XA Open bite of other part of head, initial encounter: Secondary | ICD-10-CM | POA: Diagnosis not present

## 2018-03-04 DIAGNOSIS — S0125XA Open bite of nose, initial encounter: Secondary | ICD-10-CM | POA: Insufficient documentation

## 2018-03-04 MED ORDER — TETANUS-DIPHTH-ACELL PERTUSSIS 5-2.5-18.5 LF-MCG/0.5 IM SUSP
0.5000 mL | Freq: Once | INTRAMUSCULAR | Status: AC
  Administered 2018-03-04: 0.5 mL via INTRAMUSCULAR
  Filled 2018-03-04: qty 0.5

## 2018-03-04 MED ORDER — AMOXICILLIN-POT CLAVULANATE 875-125 MG PO TABS: 1.0000 | ORAL_TABLET | Freq: Two times a day (BID) | ORAL | 0 refills | Status: AC

## 2018-03-04 NOTE — Discharge Instructions (Addendum)
I have prescribed you antibiotics, Augmentin, to take for the dog bite and to prevent infection. For pain, you may use Tylenol and/or Ibuprofen. You can also apply ice to your nose for the swelling. You may wash your face with your normal soap.  Follow-up with a medical provider if you have one or more of the following symptoms: fever; increased redness, warmth or tenderness at the wound site; pain beyond where the initial wound was; unusual discharge.  Thank you for allowing [name] to take care of you today.

## 2018-03-04 NOTE — ED Triage Notes (Signed)
Pt states she was playing with her puppy and got nipped on the nose. Dog is UTD on vaccines

## 2018-03-04 NOTE — ED Provider Notes (Signed)
[facility] EMERGENCY DEPARTMENT Provider Note  CSN: [id number] Arrival date & time: 03/04/18  1837  History   Chief Complaint Chief Complaint  Patient presents with  . Animal Bite    HPI Alison Davies is a 32 y.o. female with a medical history of migraines and HLD who presented to the ED for  Animal Bite  Contact animal:  Dog Location:  Face Facial injury location:  Nose Time since incident:  2 hours Pain details:    Quality:  Aching   Severity:  Mild Incident location:  Home Provoked: unprovoked (Puppy was being playful and "nipped" patient's nose)   Notifications:  None Animal's rabies vaccination status:  Up to date Animal in possession: yes   Tetanus status:  Unknown Relieved by:  None tried Ineffective treatments:  None tried Associated symptoms: no fever, no numbness, no rash and no swelling     Past Medical History:  Diagnosis Date  . Anxiety   . Hyperlipidemia   . Leg edema   . Migraines     Patient Active Problem List   Diagnosis Date Noted  . Pain of right heel 01/16/2018  . Low back pain 12/07/2017  . HLD (hyperlipidemia) 10/05/2017  . GAD (generalized anxiety disorder) 10/05/2017  . Migraine headache 11/16/2012    History reviewed. No pertinent surgical history.   OB History    Gravida  0   Para  0   Term  0   Preterm  0   AB  0   Living  0     SAB  0   TAB  0   Ectopic  0   Multiple  0   Live Births  0            Home Medications    Prior to Admission medications   Medication Sig Start Date End Date Taking? Authorizing Provider  ALPRAZolam Prudy Feeler) 0.25 MG tablet Take 0.25 mg by mouth as needed for anxiety.   Yes [provider]  Biotin 3 MG TABS Take 2 tablets by mouth daily.   Yes [provider]  Cyanocobalamin (VITAMIN B-12 CR) 1500 MCG TBCR Take 2 tablets by mouth 2 (two) times daily.   Yes [provider]  Melatonin 5 MG CHEW Chew 2 capsules by mouth at bedtime as needed.    Yes [provider]  Multiple Vitamin (MULTIVITAMIN WITH MINERALS) TABS tablet Take 1 tablet by mouth daily.   Yes [provider]  norethindrone (MICRONOR,CAMILA,ERRIN) 0.35 MG tablet Take 1 tablet (0.35 mg total) by mouth daily. 09/28/17  Yes Merlyn Albert, MD  sertraline (ZOLOFT) 50 MG tablet Take 1 tablet (50 mg total) by mouth daily. 03/02/18  Yes Dianne Dun, MD  SUMAtriptan (IMITREX) 25 MG tablet Take 1 tablet (25 mg total) by mouth every 2 (two) hours as needed for migraine. May repeat in 2 hours if headache persists or recurs. 10/05/17  Yes Dianne Dun, MD  topiramate (TOPAMAX) 100 MG tablet Take 1 tablet (100 mg total) by mouth at bedtime. 12/07/17  Yes Dianne Dun, MD  vitamin C (ASCORBIC ACID) 250 MG tablet Take 250 mg by mouth daily.   Yes [provider]  Vitamin D, Ergocalciferol, (DRISDOL) 50000 units CAPS capsule Take 1 capsule (50,000 Units total) by mouth every 7 (seven) days. 02/22/18  Yes [name], PA-C  amoxicillin-clavulanate (AUGMENTIN) 875-125 MG tablet Take 1 tablet by mouth every 12 (twelve) hours for 5 days. 03/04/18 03/09/18  Mortis, Sharyon Medicus, PA-C    Family History Family History  Problem Relation Age of Onset  . Anxiety disorder Mother   . Diabetes Father   . Obesity Father     Social History Social History   Tobacco Use  . Smoking status: Never Smoker  . Smokeless tobacco: Never Used  Substance Use Topics  . Alcohol use: Yes    Comment: occasional  . Drug use: Never     Allergies   Patient has no known allergies.   Review of Systems Review of Systems  Constitutional: Negative for fever.  Skin: Positive for wound. Negative for rash.  Neurological: Negative for numbness.  All other systems reviewed and are negative.    Physical Exam Updated Vital Signs BP 120/86 (BP Location: Right Arm)   Pulse 69   Temp 98.3 F (36.8 C) (Oral)   Resp 16   Ht 5\' 5"  (1.651 m)   Wt 83 kg   SpO2 99%   BMI 30.45  kg/m   Physical Exam  Constitutional: Vital signs are normal. She appears well-developed and well-nourished. She is cooperative.  HENT:  Head: Normocephalic and atraumatic.  Nose:    Neurological: She is alert.  Skin: Skin is warm and intact. Capillary refill takes less than 2 seconds. No erythema.  No other puncture wounds, lacerations, abrasions or injuries on face or extremities.  Nursing note and vitals reviewed.  ED Treatments / Results  Labs (all labs ordered are listed, but only abnormal results are displayed) Labs Reviewed - No data to display  EKG None  Radiology No results found.  Procedures Procedures (including critical care time)  Medications Ordered in ED Medications  Tdap (BOOSTRIX) injection 0.5 mL (0.5 mLs Intramuscular Given 03/04/18 2057)   Initial Impression / Assessment and Plan / ED Course  Triage vital signs and the nursing notes have been reviewed.  Pertinent labs & imaging results that were available during care of the patient were reviewed and considered in medical decision making (see chart for details).   Patient presents to the ED following a bite from the patient's dog to her nose. Injury to the nose is superficial and does not require repair. There are no other injuries from today's incident.  Final Clinical Impressions(s) / ED Diagnoses  1. Dog Bite. Rx for Augmentin prescribed as prophylactic antibiotics. Tdap booster given.  Dispo: Home. After thorough clinical evaluation, this patient is determined to be medically stable and can be safely discharged with the previously mentioned treatment and/or outpatient follow-up/referral(s). At this time, there are no other apparent medical conditions that require further screening, evaluation or treatment.   Final diagnoses:  Dog bite, initial encounter    ED Discharge Orders         Ordered    amoxicillin-clavulanate (AUGMENTIN) 875-125 MG tablet  Every 12 hours     03/04/18 2129             Reva Bores 03/04/18 2136    Arby Barrette, MD 03/05/18 0002

## 2018-03-09 ENCOUNTER — Encounter: Payer: Self-pay | Admitting: Family Medicine

## 2018-03-09 ENCOUNTER — Ambulatory Visit (INDEPENDENT_AMBULATORY_CARE_PROVIDER_SITE_OTHER): Payer: 59 | Admitting: Family Medicine

## 2018-03-09 ENCOUNTER — Other Ambulatory Visit (HOSPITAL_COMMUNITY)
Admission: RE | Admit: 2018-03-09 | Discharge: 2018-03-09 | Disposition: A | Discharge status: Home / Self Care | Payer: 59 | Source: Ambulatory Visit | Attending: Family Medicine | Admitting: Family Medicine

## 2018-03-09 VITALS — BP 118/90 | HR 75 | Temp 97.7°F | Ht 65.0 in | Wt 188.0 lb

## 2018-03-09 DIAGNOSIS — N898 Other specified noninflammatory disorders of vagina: Secondary | ICD-10-CM | POA: Diagnosis not present

## 2018-03-09 DIAGNOSIS — Z202 Contact with and (suspected) exposure to infections with a predominantly sexual mode of transmission: Secondary | ICD-10-CM | POA: Diagnosis not present

## 2018-03-09 DIAGNOSIS — Z113 Encounter for screening for infections with a predominantly sexual mode of transmission: Secondary | ICD-10-CM

## 2018-03-09 MED ORDER — METRONIDAZOLE 500 MG PO TABS: 500.0000 mg | ORAL_TABLET | Freq: Two times a day (BID) | ORAL | 0 refills | Status: DC

## 2018-03-09 NOTE — Progress Notes (Signed)
Alison Davies is a 32 y.o. female  Chief Complaint  Patient presents with  . Exposure to STD    has unprotected sex 2 weeks ago. Concerned about contracting Trich    HPI: Alison Davies is a 32 y.o. female who notes a possible exposure to STD after having unprotected sex 2 weeks ago.  She complains of vaginal discharge, itching x 2 weeks. She tried using OTC monistat thinking it was a yeast infection but that caused intense burning. No fever, chills. No vaginal pain. No abnormal bleeding.   She has yeast infection, BV, and trich about 1 mo ago (same partner as current one). Symptoms completely resolved after treatment.   Past Medical History:  Diagnosis Date  . Anxiety   . Hyperlipidemia   . Leg edema   . Migraines     No past surgical history on file.  Social History   Socioeconomic History  . Marital status: Single    Spouse name: Not on file  . Number of children: Not on file  . Years of education: Not on file  . Highest education level: Not on file  Occupational History  . Occupation: [employer] manager: [employer]  Social Needs  . Financial resource strain: Not on file  . Food insecurity:    Worry: Not on file    Inability: Not on file  . Transportation needs:    Medical: Not on file    Non-medical: Not on file  Tobacco Use  . Smoking status: Never Smoker  . Smokeless tobacco: Never Used  Substance and Sexual Activity  . Alcohol use: Yes    Comment: occasional  . Drug use: Never  . Sexual activity: Not on file  Lifestyle  . Physical activity:    Days per week: Not on file    Minutes per session: Not on file  . Stress: Not on file  Relationships  . Social connections:    Talks on phone: Not on file    Gets together: Not on file    Attends religious service: Not on file    Active member of club or organization: Not on file    Attends meetings of clubs or organizations: Not on file    Relationship status: Not on file  . Intimate partner  violence:    Fear of current or ex partner: Not on file    Emotionally abused: Not on file    Physically abused: Not on file    Forced sexual activity: Not on file  Other Topics Concern  . Not on file  Social History Narrative  . Not on file    Family History  Problem Relation Age of Onset  . Anxiety disorder Mother   . Diabetes Father   . Obesity Father      Immunization History  Administered Date(s) Administered  . Influenza-Unspecified 01/17/2014  . PPD Test 02/24/2014, 03/06/2014, 03/27/2015, 03/28/2016  . Tdap 02/17/2014, 03/04/2018    Outpatient Encounter Medications as of 03/09/2018  Medication Sig  . ALPRAZolam (XANAX) 0.25 MG tablet Take 0.25 mg by mouth as needed for anxiety.  Marland Kitchen amoxicillin-clavulanate (AUGMENTIN) 875-125 MG tablet Take 1 tablet by mouth every 12 (twelve) hours for 5 days.  . Biotin 3 MG TABS Take 2 tablets by mouth daily.  . Cyanocobalamin (VITAMIN B-12 CR) 1500 MCG TBCR Take 2 tablets by mouth 2 (two) times daily.  . Melatonin 5 MG CHEW Chew 2 capsules by mouth at bedtime as needed.  Marland Kitchen  Multiple Vitamin (MULTIVITAMIN WITH MINERALS) TABS tablet Take 1 tablet by mouth daily.  . norethindrone (MICRONOR,CAMILA,ERRIN) 0.35 MG tablet Take 1 tablet (0.35 mg total) by mouth daily.  . sertraline (ZOLOFT) 50 MG tablet Take 1 tablet (50 mg total) by mouth daily.  . SUMAtriptan (IMITREX) 25 MG tablet Take 1 tablet (25 mg total) by mouth every 2 (two) hours as needed for migraine. May repeat in 2 hours if headache persists or recurs.  . topiramate (TOPAMAX) 100 MG tablet Take 1 tablet (100 mg total) by mouth at bedtime.  . vitamin C (ASCORBIC ACID) 250 MG tablet Take 250 mg by mouth daily.  . Vitamin D, Ergocalciferol, (DRISDOL) 50000 units CAPS capsule Take 1 capsule (50,000 Units total) by mouth every 7 (seven) days.   No facility-administered encounter medications on file as of 03/09/2018.      ROS: Gen: no fever, chills  Skin: no rash, itching ENT: no  ear pain, ear drainage, nasal congestion, rhinorrhea, sinus pressure, sore throat Eyes: no blurry vision, double vision Resp: no cough, wheeze,SOB CV: no CP, palpitations, LE edema,  GI: no heartburn, n/v/d/c, abd pain GU: no dysuria, urgency, frequency, hematuria; pertinent positives noted in HPI MSK: no joint pain, myalgias, back pain Neuro: no dizziness, headache, weakness, vertigo Psych: no depression, anxiety, insomnia   No Known Allergies  BP 118/90 (BP Location: Left Arm, Patient Position: Sitting, Cuff Size: Normal)   Pulse 75   Temp 97.7 F (36.5 C) (Oral)   Ht 5\' 5"  (1.651 m)   Wt 188 lb (85.3 kg)   SpO2 98%   BMI 31.28 kg/m   Physical Exam  Constitutional: She appears well-developed and well-nourished.  Genitourinary: There is no tenderness or lesion on the right labia. There is no tenderness or lesion on the left labia. Cervix exhibits discharge. Cervix exhibits no friability. No tenderness or bleeding in the vagina. Vaginal discharge (copious amount of thin white discharge) found.     A/P:  1. Exposure to STD 2. Screen for STD (sexually transmitted disease) 3. Vaginal discharge 4. Vaginal itching - Cervicovaginal ancillary only( [place]) - Hepatitis B surface antigen - Hepatitis c antibody (reflex) - HIV Antibody (routine testing w rflx) - RPR - HSV(herpes simplex vrs) 1+2 ab-IgG - discussed safe sex practices Rx: - metroNIDAZOLE (FLAGYL) 500 MG tablet; Take 1 tablet (500 mg total) by mouth 2 (two) times daily for 7 days.  Dispense: 14 tablet; Refill: 0 - will use this dosing since uncertain BV vs trich at this time - f/u if symptoms worsen or do not improve in 7-10 days Discussed plan and reviewed medications with patient, including risks, benefits, and potential side effects. Pt expressed understand. All questions answered.

## 2018-03-12 LAB — HEPATITIS C ANTIBODY
Hepatitis C Ab: NONREACTIVE
SIGNAL TO CUT-OFF: 0.02 (ref <1.00)

## 2018-03-12 LAB — RPR: RPR Ser Ql: NONREACTIVE

## 2018-03-12 LAB — CERVICOVAGINAL ANCILLARY ONLY
Bacterial vaginitis: NEGATIVE
Candida vaginitis: POSITIVE — AB
Chlamydia: NEGATIVE
Neisseria Gonorrhea: NEGATIVE
Trichomonas: POSITIVE — AB

## 2018-03-12 LAB — HSV(HERPES SIMPLEX VRS) I + II AB-IGG
HAV 1 IGG,TYPE SPECIFIC AB: <0.9 index
HSV 2 IGG,TYPE SPECIFIC AB: <0.9 index

## 2018-03-12 LAB — HIV ANTIBODY (ROUTINE TESTING W REFLEX): HIV 1&2 Ab, 4th Generation: NONREACTIVE

## 2018-03-12 LAB — HEPATITIS B SURFACE ANTIGEN: Hepatitis B Surface Ag: NONREACTIVE

## 2018-03-13 ENCOUNTER — Encounter (INDEPENDENT_AMBULATORY_CARE_PROVIDER_SITE_OTHER): Payer: Self-pay

## 2018-03-13 ENCOUNTER — Encounter: Payer: Self-pay | Admitting: Family Medicine

## 2018-03-13 DIAGNOSIS — Z8619 Personal history of other infectious and parasitic diseases: Secondary | ICD-10-CM

## 2018-03-13 DIAGNOSIS — B379 Candidiasis, unspecified: Secondary | ICD-10-CM

## 2018-03-14 MED ORDER — FLUCONAZOLE 150 MG PO TABS: ORAL_TABLET | ORAL | 0 refills | Status: DC

## 2018-03-14 MED FILL — FLUCONAZOLE 150 MG TABS: 150 | 3 days supply | Qty: 2 | Fill #0

## 2018-03-15 ENCOUNTER — Ambulatory Visit (INDEPENDENT_AMBULATORY_CARE_PROVIDER_SITE_OTHER): Payer: 59 | Admitting: Physician Assistant

## 2018-03-15 VITALS — BP 133/83 | HR 69 | Ht 63.0 in | Wt 185.0 lb

## 2018-03-15 DIAGNOSIS — E559 Vitamin D deficiency, unspecified: Secondary | ICD-10-CM

## 2018-03-15 DIAGNOSIS — Z6832 Body mass index (BMI) 32.0-32.9, adult: Secondary | ICD-10-CM | POA: Diagnosis not present

## 2018-03-15 DIAGNOSIS — E785 Hyperlipidemia, unspecified: Secondary | ICD-10-CM | POA: Diagnosis not present

## 2018-03-15 DIAGNOSIS — E669 Obesity, unspecified: Secondary | ICD-10-CM | POA: Diagnosis not present

## 2018-03-15 DIAGNOSIS — Z9189 Other specified personal risk factors, not elsewhere classified: Secondary | ICD-10-CM

## 2018-03-15 MED ORDER — VITAMIN D (ERGOCALCIFEROL) 1.25 MG (50000 UNIT) PO CAPS: 50000.0000 [IU] | ORAL_CAPSULE | ORAL | 0 refills | Status: DC

## 2018-03-19 NOTE — Progress Notes (Signed)
Office: [phone]  /  Fax: [phone]   HPI:   Chief Complaint: OBESITY Alison Davies is here to discuss her progress with her obesity treatment plan. Alison Davies is on the keep a food journal with 1500 calories and 95 grams of protein daily and is following her eating plan approximately 40 % of the time. Alison Davies states Alison Davies is walking the dog for 15 minutes 7 times per week. Alison Davies reports that Alison Davies has been struggling with stress eating due to stress at work. Alison Davies is ready to get back on track.  Her weight is 185 lb (83.9 kg) today and has gained 2 pounds since her last visit. Alison Davies has lost 14 lbs since starting treatment with [name].  Vitamin D Deficiency Alison Davies has a diagnosis of vitamin D deficiency. Alison Davies is on prescription Vit D and denies nausea, vomiting or muscle weakness.  At risk for osteopenia and osteoporosis Alison Davies is at higher risk of osteopenia and osteoporosis due to vitamin D deficiency.   Hyperlipidemia Alison Davies has hyperlipidemia and has been trying to improve her cholesterol levels with intensive lifestyle modification including a low saturated fat diet, exercise and weight loss. Alison Davies is not on medications and last level not at goal. Alison Davies denies any chest pain, claudication or myalgias.  ALLERGIES: No Known Allergies  MEDICATIONS: Current Outpatient Medications on File Prior to Visit  Medication Sig Dispense Refill  . ALPRAZolam (XANAX) 0.25 MG tablet Take 0.25 mg by mouth as needed for anxiety.    . Biotin 3 MG TABS Take 2 tablets by mouth daily.    . Cyanocobalamin (VITAMIN B-12 CR) 1500 MCG TBCR Take 2 tablets by mouth 2 (two) times daily.    . fluconazole (DIFLUCAN) 150 MG tablet 1 tab po x 1 dose then repeat x 1 in 72 hrs 2 tablet 0  . Melatonin 5 MG CHEW Chew 2 capsules by mouth at bedtime as needed.    . Multiple Vitamin (MULTIVITAMIN WITH MINERALS) TABS tablet Take 1 tablet by mouth daily.    . norethindrone (MICRONOR,CAMILA,ERRIN) 0.35 MG tablet Take 1 tablet (0.35 mg total) by mouth  daily. 1 Package 11  . sertraline (ZOLOFT) 50 MG tablet Take 1 tablet (50 mg total) by mouth daily. 30 tablet 3  . SUMAtriptan (IMITREX) 25 MG tablet Take 1 tablet (25 mg total) by mouth every 2 (two) hours as needed for migraine. May repeat in 2 hours if headache persists or recurs. 10 tablet 2  . topiramate (TOPAMAX) 100 MG tablet Take 1 tablet (100 mg total) by mouth at bedtime. 30 tablet 2  . vitamin C (ASCORBIC ACID) 250 MG tablet Take 250 mg by mouth daily.     No current facility-administered medications on file prior to visit.     PAST MEDICAL HISTORY: Past Medical History:  Diagnosis Date  . Anxiety   . Hyperlipidemia   . Leg edema   . Migraines     PAST SURGICAL HISTORY: No past surgical history on file.  SOCIAL HISTORY: Social History   Tobacco Use  . Smoking status: Never Smoker  . Smokeless tobacco: Never Used  Substance Use Topics  . Alcohol use: Yes    Comment: occasional  . Drug use: Never    FAMILY HISTORY: Family History  Problem Relation Age of Onset  . Anxiety disorder Mother   . Diabetes Father   . Obesity Father     ROS: Review of Systems  Constitutional: Negative for weight loss.  Cardiovascular: Negative for chest pain and claudication.  Gastrointestinal: Negative for nausea and vomiting.  Musculoskeletal: Negative for myalgias.       Negative muscle weakness    PHYSICAL EXAM: Blood pressure 133/83, pulse 69, height 5\' 3"  (1.6 m), weight 185 lb (83.9 kg), SpO2 99 %. Body mass index is 32.77 kg/m. Physical Exam  Constitutional: Alison Davies is oriented to person, place, and time. Alison Davies appears well-developed and well-nourished.  Cardiovascular: Normal rate.  Pulmonary/Chest: Effort normal.  Musculoskeletal: Normal range of motion.  Neurological: Alison Davies is oriented to person, place, and time.  Skin: Skin is warm and dry.  Psychiatric: Alison Davies has a normal mood and affect. Her behavior is normal.  Vitals reviewed.   RECENT LABS AND TESTS: BMET      Component Value Date/Time   NA 141 12/19/2017 0806   K 4.4 12/19/2017 0806   CL 108 (H) 12/19/2017 0806   CO2 20 12/19/2017 0806   GLUCOSE 100 (H) 12/19/2017 0806   GLUCOSE 84 03/03/2014 1208   BUN 14 12/19/2017 0806   CREATININE 0.75 12/19/2017 0806   CALCIUM 9.7 12/19/2017 0806   GFRNONAA 107 12/19/2017 0806   GFRAA 124 12/19/2017 0806   Lab Results  Component Value Date   HGBA1C 4.7 (L) 12/19/2017   HGBA1C 5.1 07/26/2017   Lab Results  Component Value Date   INSULIN 18.0 12/19/2017   INSULIN 14.6 07/26/2017   CBC    Component Value Date/Time   WBC 8.2 06/02/2017 0945   RBC 4.78 06/02/2017 0945   HGB 14.5 06/02/2017 0945   HCT 44.3 06/02/2017 0945   PLT 323 06/02/2017 0945   MCV 93 06/02/2017 0945   MCH 30.3 06/02/2017 0945   MCHC 32.7 06/02/2017 0945   RDW 12.3 06/02/2017 0945   LYMPHSABS 2.4 06/02/2017 0945   EOSABS 0.2 06/02/2017 0945   BASOSABS 0.0 06/02/2017 0945   Iron/TIBC/Ferritin/ %Sat No results found for: IRON, TIBC, FERRITIN, IRONPCTSAT Lipid Panel     Component Value Date/Time   CHOL 200 (H) 12/19/2017 0806   TRIG 142 12/19/2017 0806   HDL 40 12/19/2017 0806   CHOLHDL 5.0 (H) 06/02/2017 0945   CHOLHDL 5.1 03/03/2014 1208   VLDL 41 (H) 03/03/2014 1208   LDLCALC 132 (H) 12/19/2017 0806   Hepatic Function Panel     Component Value Date/Time   PROT 6.9 12/19/2017 0806   ALBUMIN 4.4 12/19/2017 0806   AST 14 12/19/2017 0806   ALT 18 12/19/2017 0806   ALKPHOS 64 12/19/2017 0806   BILITOT 0.5 12/19/2017 0806   BILIDIR 0.12 06/02/2017 0945      Component Value Date/Time   TSH 1.850 07/26/2017 1127  Results for KALESE, ENSZ (MRN [id number]) as of 03/19/2018 09:29  Ref. Range 12/19/2017 08:06  Vitamin D, 25-Hydroxy Latest Ref Range: 30.0 - 100.0 ng/mL 28.5 (L)    ASSESSMENT AND PLAN: Vitamin D deficiency - Plan: Vitamin D, Ergocalciferol, (DRISDOL) 1.25 MG (50000 UT) CAPS capsule  Hyperlipidemia, unspecified hyperlipidemia type  At  risk for osteoporosis  Class 1 obesity with serious comorbidity and body mass index (BMI) of 32.0 to 32.9 in adult, unspecified obesity type  PLAN:  Vitamin D Deficiency Alison Davies was informed that low vitamin D levels contributes to fatigue and are associated with obesity, breast, and colon cancer. Alison Davies agrees to continue taking prescription Vit D @50 ,000 IU every week #4 and we will refill for 1 month. Alison Davies will follow up for routine testing of vitamin D, at least 2-3 times per year. Alison Davies was informed of the risk  of over-replacement of vitamin D and agrees to not increase her dose unless Alison Davies discusses this with Alison Davies first. Alison Davies agrees to follow up with our clinic in 2 to 3 weeks.  At risk for osteopenia and osteoporosis Alison Davies was given extended  (15 minutes) osteoporosis prevention counseling today. Alison Davies is at risk for osteopenia and osteoporsis due to her vitamin D deficiency. Alison Davies was encouraged to take her vitamin D and follow her higher calcium diet and increase strengthening exercise to help strengthen her bones and decrease her risk of osteopenia and osteoporosis.  Hyperlipidemia Alison Davies was informed of the American Heart Association Guidelines emphasizing intensive lifestyle modifications as the first line treatment for hyperlipidemia. We discussed many lifestyle modifications today in depth, and Alison Davies will continue to work on decreasing saturated fats such as fatty red meat, butter and many fried foods. Alison Davies will also increase vegetables and lean protein in her diet and continue to work on diet, exercise, and weight loss efforts. Alison Davies agrees to follow up with our clinic in 2 to 3 weeks.   Obesity Alison Davies is currently in the action stage of change. As such, her goal is to continue with weight loss efforts Alison Davies has agreed to follow the Category 3 plan Alison Davies has been instructed to work up to a goal of 150 minutes of combined cardio and strengthening exercise per week for weight loss and overall  health benefits. We discussed the following Behavioral Modification Strategies today: increasing lean protein intake, decreasing simple carbohydrates  and holiday eating strategies    Alison Davies has agreed to follow up with our clinic in 2 to 3 weeks. Alison Davies was informed of the importance of frequent follow up visits to maximize her success with intensive lifestyle modifications for her multiple health conditions.   OBESITY BEHAVIORAL INTERVENTION VISIT  Today's visit was # 12   Starting weight: 199 lbs Starting date: 07/26/17 Today's weight : 185 lbs Today's date: 03/15/2018 Total lbs lost to date: 14    ASK: We discussed the diagnosis of obesity with Arva Chafeiana M Heiser today and Alison Davies agreed to give Alison Davies permission to discuss obesity behavioral modification therapy today.  ASSESS: Alison Davies has the diagnosis of obesity and her BMI today is 32.78 Alison Davies is in the action stage of change   ADVISE: Alison Davies was educated on the multiple health risks of obesity as well as the benefit of weight loss to improve her health. Alison Davies was advised of the need for long term treatment and the importance of lifestyle modifications.  AGREE: Multiple dietary modification options and treatment options were discussed and  Alison Davies agreed to the above obesity treatment plan.  Trude McburneyI, Tekeisha Hakim, am acting as transcriptionist for Alois Clicheracey Aguilar, PA-C I, Alois Clicheracey Aguilar, PA-C have reviewed above note and agree with its content

## 2018-03-26 ENCOUNTER — Other Ambulatory Visit (HOSPITAL_COMMUNITY)
Admission: RE | Admit: 2018-03-26 | Discharge: 2018-03-26 | Disposition: A | Discharge status: Home / Self Care | Payer: 59 | Source: Ambulatory Visit | Attending: Family Medicine | Admitting: Family Medicine

## 2018-03-26 ENCOUNTER — Other Ambulatory Visit: Payer: 59

## 2018-03-26 DIAGNOSIS — Z8619 Personal history of other infectious and parasitic diseases: Secondary | ICD-10-CM | POA: Diagnosis not present

## 2018-03-28 ENCOUNTER — Encounter: Payer: Self-pay | Admitting: Family Medicine

## 2018-03-28 LAB — URINE CYTOLOGY ANCILLARY ONLY: Trichomonas: NEGATIVE

## 2018-04-02 ENCOUNTER — Ambulatory Visit (INDEPENDENT_AMBULATORY_CARE_PROVIDER_SITE_OTHER): Payer: 59 | Admitting: Physician Assistant

## 2018-04-02 VITALS — BP 105/72 | HR 75 | Ht 63.0 in | Wt 185.0 lb

## 2018-04-02 DIAGNOSIS — E559 Vitamin D deficiency, unspecified: Secondary | ICD-10-CM | POA: Diagnosis not present

## 2018-04-02 DIAGNOSIS — E669 Obesity, unspecified: Secondary | ICD-10-CM

## 2018-04-02 DIAGNOSIS — Z6832 Body mass index (BMI) 32.0-32.9, adult: Secondary | ICD-10-CM

## 2018-04-04 NOTE — Progress Notes (Signed)
Office: [phone]  /  Fax: [phone]   HPI:   Chief Complaint: OBESITY Alison Davies is here to discuss her progress with her obesity treatment plan. She is on the Category 3 plan and is following her eating plan approximately 40 % of the time. She states she is walking the dog for 15 minutes 7 times per week. Alison Davies did well with weight maintenance through the holiday. She wants to discuss strategies for Christmas parties and Christmas day eating.  Her weight is 185 lb (83.9 kg) today and has not lost weight since her last visit. She has lost 14 lbs since starting treatment with us.  Vitamin D Deficiency Alison Davies has a diagnosis of vitamin D deficiency. She is currently taking prescription Vit D and denies nausea, vomiting or muscle weakness.  ALLERGIES: No Known Allergies  MEDICATIONS: Current Outpatient Medications on File Prior to Visit  Medication Sig Dispense Refill  . ALPRAZolam (XANAX) 0.25 MG tablet Take 0.25 mg by mouth as needed for anxiety.    . Biotin 3 MG TABS Take 2 tablets by mouth daily.    . Cyanocobalamin (VITAMIN B-12 CR) 1500 MCG TBCR Take 2 tablets by mouth 2 (two) times daily.    . Melatonin 5 MG CHEW Chew 2 capsules by mouth at bedtime as needed.    . Multiple Vitamin (MULTIVITAMIN WITH MINERALS) TABS tablet Take 1 tablet by mouth daily.    . norethindrone (MICRONOR,CAMILA,ERRIN) 0.35 MG tablet Take 1 tablet (0.35 mg total) by mouth daily. 1 Package 11  . sertraline (ZOLOFT) 50 MG tablet Take 1 tablet (50 mg total) by mouth daily. 30 tablet 3  . SUMAtriptan (IMITREX) 25 MG tablet Take 1 tablet (25 mg total) by mouth every 2 (two) hours as needed for migraine. May repeat in 2 hours if headache persists or recurs. 10 tablet 2  . topiramate (TOPAMAX) 100 MG tablet Take 1 tablet (100 mg total) by mouth at bedtime. 30 tablet 2  . vitamin C (ASCORBIC ACID) 250 MG tablet Take 250 mg by mouth daily.    . Vitamin D, Ergocalciferol, (DRISDOL) 1.25 MG (50000 UT) CAPS capsule  Take 1 capsule (50,000 Units total) by mouth every 7 (seven) days. 4 capsule 0   No current facility-administered medications on file prior to visit.     PAST MEDICAL HISTORY: Past Medical History:  Diagnosis Date  . Anxiety   . Hyperlipidemia   . Leg edema   . Migraines     PAST SURGICAL HISTORY: No past surgical history on file.  SOCIAL HISTORY: Social History   Tobacco Use  . Smoking status: Never Smoker  . Smokeless tobacco: Never Used  Substance Use Topics  . Alcohol use: Yes    Comment: occasional  . Drug use: Never    FAMILY HISTORY: Family History  Problem Relation Age of Onset  . Anxiety disorder Mother   . Diabetes Father   . Obesity Father     ROS: Review of Systems  Constitutional: Negative for weight loss.  Gastrointestinal: Negative for nausea and vomiting.  Musculoskeletal:       Negative muscle weakness    PHYSICAL EXAM: Blood pressure 105/72, pulse 75, height 5\' 3"  (1.6 m), weight 185 lb (83.9 kg), SpO2 98 %. Body mass index is 32.77 kg/m. Physical Exam  Constitutional: She is oriented to person, place, and time. She appears well-developed and well-nourished.  Cardiovascular: Normal rate.  Pulmonary/Chest: Effort normal.  Musculoskeletal: Normal range of motion.  Neurological: She is oriented to  person, place, and time.  Skin: Skin is warm and dry.  Psychiatric: She has a normal mood and affect. Her behavior is normal.  Vitals reviewed.   RECENT LABS AND TESTS: BMET    Component Value Date/Time   NA 141 12/19/2017 0806   K 4.4 12/19/2017 0806   CL 108 (H) 12/19/2017 0806   CO2 20 12/19/2017 0806   GLUCOSE 100 (H) 12/19/2017 0806   GLUCOSE 84 03/03/2014 1208   BUN 14 12/19/2017 0806   CREATININE 0.75 12/19/2017 0806   CALCIUM 9.7 12/19/2017 0806   GFRNONAA 107 12/19/2017 0806   GFRAA 124 12/19/2017 0806   Lab Results  Component Value Date   HGBA1C 4.7 (L) 12/19/2017   HGBA1C 5.1 07/26/2017   Lab Results  Component Value  Date   INSULIN 18.0 12/19/2017   INSULIN 14.6 07/26/2017   CBC    Component Value Date/Time   WBC 8.2 06/02/2017 0945   RBC 4.78 06/02/2017 0945   HGB 14.5 06/02/2017 0945   HCT 44.3 06/02/2017 0945   PLT 323 06/02/2017 0945   MCV 93 06/02/2017 0945   MCH 30.3 06/02/2017 0945   MCHC 32.7 06/02/2017 0945   RDW 12.3 06/02/2017 0945   LYMPHSABS 2.4 06/02/2017 0945   EOSABS 0.2 06/02/2017 0945   BASOSABS 0.0 06/02/2017 0945   Iron/TIBC/Ferritin/ %Sat No results found for: IRON, TIBC, FERRITIN, IRONPCTSAT Lipid Panel     Component Value Date/Time   CHOL 200 (H) 12/19/2017 0806   TRIG 142 12/19/2017 0806   HDL 40 12/19/2017 0806   CHOLHDL 5.0 (H) 06/02/2017 0945   CHOLHDL 5.1 03/03/2014 1208   VLDL 41 (H) 03/03/2014 1208   LDLCALC 132 (H) 12/19/2017 0806   Hepatic Function Panel     Component Value Date/Time   PROT 6.9 12/19/2017 0806   ALBUMIN 4.4 12/19/2017 0806   AST 14 12/19/2017 0806   ALT 18 12/19/2017 0806   ALKPHOS 64 12/19/2017 0806   BILITOT 0.5 12/19/2017 0806   BILIDIR 0.12 06/02/2017 0945      Component Value Date/Time   TSH 1.850 07/26/2017 1127  Results for JAHMIA, BERRETT (MRN [id number]) as of 04/04/2018 12:36  Ref. Range 12/19/2017 08:06  Vitamin D, 25-Hydroxy Latest Ref Range: 30.0 - 100.0 ng/mL 28.5 (L)    ASSESSMENT AND PLAN: Vitamin D deficiency  Class 1 obesity with serious comorbidity and body mass index (BMI) of 32.0 to 32.9 in adult, unspecified obesity type  PLAN:  Vitamin D Deficiency Alison Davies was informed that low vitamin D levels contributes to fatigue and are associated with obesity, breast, and colon cancer. Alison Davies agrees to continue taking prescription Vit D @50 ,000 IU every week and will follow up for routine testing of vitamin D, at least 2-3 times per year. She was informed of the risk of over-replacement of vitamin D and agrees to not increase her dose unless she discusses this with [name] first. Alison Davies agrees to follow up with our clinic  in 2 weeks.  I spent > than 50% of the 15 minute visit on counseling as documented in the note.  Obesity Alison Davies is currently in the action stage of change. As such, her goal is to continue with weight loss efforts She has agreed to follow the Category 3 plan Alison Davies has been instructed to work up to a goal of 150 minutes of combined cardio and strengthening exercise per week for weight loss and overall health benefits. We discussed the following Behavioral Modification Strategies today: work on  meal planning and easy cooking plans and planning for success   Alison Davies has agreed to follow up with our clinic in 2 weeks. She was informed of the importance of frequent follow up visits to maximize her success with intensive lifestyle modifications for her multiple health conditions.   OBESITY BEHAVIORAL INTERVENTION VISIT  Today's visit was # 13   Starting weight: 199 lbs Starting date: 07/26/17 Today's weight : 185 lbs Today's date: 04/02/2018 Total lbs lost to date: 14    ASK: We discussed the diagnosis of obesity with Alison Davies today and Alison Davies agreed to give [name] permission to discuss obesity behavioral modification therapy today.  ASSESS: Alison Davies has the diagnosis of obesity and her BMI today is 32.78 Alison Davies is in the action stage of change   ADVISE: Alison Davies was educated on the multiple health risks of obesity as well as the benefit of weight loss to improve her health. She was advised of the need for long term treatment and the importance of lifestyle modifications.  AGREE: Multiple dietary modification options and treatment options were discussed and  Alison Davies agreed to the above obesity treatment plan.  Alison Davies, am acting as transcriptionist for [name], PA-C I, [name], PA-C have reviewed above note and agree with its content

## 2018-04-10 MED FILL — VIT D2 1.25 MG (50,000 UNIT: 1.25 MG | 28 days supply | Qty: 4 | Fill #0

## 2018-04-10 MED FILL — TOPIRAMATE 100 MG TABLET: 100 | 30 days supply | Qty: 30 | Fill #2

## 2018-04-10 MED FILL — SERTRALINE HCL 50 MG TABLET: 50 | 30 days supply | Qty: 30 | Fill #1

## 2018-04-19 ENCOUNTER — Encounter (INDEPENDENT_AMBULATORY_CARE_PROVIDER_SITE_OTHER): Payer: Self-pay | Admitting: Physician Assistant

## 2018-04-19 ENCOUNTER — Ambulatory Visit (INDEPENDENT_AMBULATORY_CARE_PROVIDER_SITE_OTHER): Payer: 59 | Admitting: Physician Assistant

## 2018-04-19 VITALS — BP 106/76 | HR 69 | Temp 98.1°F | Ht 63.0 in | Wt 183.0 lb

## 2018-04-19 DIAGNOSIS — Z9189 Other specified personal risk factors, not elsewhere classified: Secondary | ICD-10-CM

## 2018-04-19 DIAGNOSIS — E8881 Metabolic syndrome: Secondary | ICD-10-CM

## 2018-04-19 DIAGNOSIS — E7849 Other hyperlipidemia: Secondary | ICD-10-CM | POA: Diagnosis not present

## 2018-04-19 DIAGNOSIS — Z6832 Body mass index (BMI) 32.0-32.9, adult: Secondary | ICD-10-CM

## 2018-04-19 DIAGNOSIS — E669 Obesity, unspecified: Secondary | ICD-10-CM | POA: Diagnosis not present

## 2018-04-19 DIAGNOSIS — E559 Vitamin D deficiency, unspecified: Secondary | ICD-10-CM | POA: Diagnosis not present

## 2018-04-19 MED ORDER — VITAMIN D (ERGOCALCIFEROL) 1.25 MG (50000 UNIT) PO CAPS: 50000.0000 [IU] | ORAL_CAPSULE | ORAL | 0 refills | Status: DC

## 2018-04-19 NOTE — Progress Notes (Signed)
Office: [phone]  /  Fax: [phone]   HPI:   Chief Complaint: OBESITY Alison Davies is here to discuss her progress with her obesity treatment plan. She is on the Category 3 plan and is following her eating plan approximately 75 % of the time. She states she is walking 15 minutes 7 times per week. Alison Davies did well with weight loss. She reports that she has been journaling and she enjoys it. Alison Davies has been using her "[place] [place] Pot" often and she finds that it is helpful for her meal. Her weight is 183 lb (83 kg) today and has had a weight loss of 2 pounds over a period of 2 weeks since her last visit. She has lost 16 lbs since starting treatment with us.  Hyperlipidemia Alison Davies has hyperlipidemia and she is not on medications. She has been trying to improve her cholesterol levels with intensive lifestyle modification including a low saturated fat diet, exercise and weight loss. She denies any chest pain.  Vitamin D deficiency Alison Davies has a diagnosis of vitamin D deficiency. She is currently taking prescription vit D and denies nausea, vomiting or muscle weakness.  Insulin Resistance Alison Davies has a diagnosis of insulin resistance based on her elevated fasting insulin level >5. Although Alison Davies's blood glucose readings are still under good control, insulin resistance puts her at greater risk of metabolic syndrome and diabetes. She is not taking metformin currently and continues to work on diet and exercise to decrease risk of diabetes. Alison Davies denies polyphagia.  At risk for diabetes Alison Davies is at higher than average risk for developing diabetes due to her obesity and insulin resistance. She currently denies polyuria or polydipsia.  ASSESSMENT AND PLAN:  Other hyperlipidemia - Plan: Lipid Panel With LDL/HDL Ratio  Vitamin D deficiency - Plan: VITAMIN D 25 Hydroxy (Vit-D Deficiency, Fractures), Vitamin D, Ergocalciferol, (DRISDOL) 1.25 MG (50000 UT) CAPS capsule  Insulin resistance - Plan: Comprehensive  metabolic panel, Hemoglobin A1c, Insulin, random  At risk for diabetes mellitus  Class 1 obesity with serious comorbidity and body mass index (BMI) of 32.0 to 32.9 in adult, unspecified obesity type  PLAN:  Hyperlipidemia Alison Davies was informed of the American Heart Association Guidelines emphasizing intensive lifestyle modifications as the first line treatment for hyperlipidemia. We discussed many lifestyle modifications today in depth, and Alison Davies will continue to work on decreasing saturated fats such as fatty red meat, butter and many fried foods. She will also increase vegetables and lean protein in her diet and continue to work on exercise and weight loss efforts. We will check labs today and Alison Davies agreed to follow up as directed.  Vitamin D Deficiency Alison Davies was informed that low vitamin D levels contributes to fatigue and are associated with obesity, breast, and colon cancer. She agrees to continue to take prescription Vit D @50 ,000 IU every week #4 with no refills and will follow up for routine testing of vitamin D, at least 2-3 times per year. She was informed of the risk of over-replacement of vitamin D and agrees to not increase her dose unless she discusses this with us first. We will check labs today and Alison Davies agreed to follow up with our clinic in 3 weeks.  Insulin Resistance Alison Davies will continue to work on weight loss, exercise, and decreasing simple carbohydrates in her diet to help decrease the risk of diabetes. She was informed that eating too many simple carbohydrates or too many calories at one sitting increases the likelihood of GI side effects. Alison Davies agreed to follow  up with [name] as directed to monitor her progress.  Diabetes risk counseling Alison Davies was given extended (15 minutes) diabetes prevention counseling today. She is 32 y.o. female and has risk factors for diabetes including obesity and insulin resistance. We discussed intensive lifestyle modifications today with an emphasis on  weight loss as well as increasing exercise and decreasing simple carbohydrates in her diet.  Obesity Alison Davies is currently in the action stage of change. As such, her goal is to continue with weight loss efforts She has agreed to change to keeping a food journal with 1500 calories and 95 grams of protein daily Alison Davies has been instructed to work up to a goal of 150 minutes of combined cardio and strengthening exercise per week for weight loss and overall health benefits. We discussed the following Behavioral Modification Strategies today: work on meal planning and easy cooking plans and holiday eating strategies   Alison Davies has agreed to follow up with our clinic in 3 weeks. She was informed of the importance of frequent follow up visits to maximize her success with intensive lifestyle modifications for her multiple health conditions.  ALLERGIES: No Known Allergies  MEDICATIONS: Current Outpatient Medications on File Prior to Visit  Medication Sig Dispense Refill  . ALPRAZolam (XANAX) 0.25 MG tablet Take 0.25 mg by mouth as needed for anxiety.    . Biotin 3 MG TABS Take 2 tablets by mouth daily.    . Cyanocobalamin (VITAMIN B-12 CR) 1500 MCG TBCR Take 2 tablets by mouth 2 (two) times daily.    . Melatonin 5 MG CHEW Chew 2 capsules by mouth at bedtime as needed.    . Multiple Vitamin (MULTIVITAMIN WITH MINERALS) TABS tablet Take 1 tablet by mouth daily.    . norethindrone (MICRONOR,CAMILA,ERRIN) 0.35 MG tablet Take 1 tablet (0.35 mg total) by mouth daily. 1 Package 11  . sertraline (ZOLOFT) 50 MG tablet Take 1 tablet (50 mg total) by mouth daily. 30 tablet 3  . SUMAtriptan (IMITREX) 25 MG tablet Take 1 tablet (25 mg total) by mouth every 2 (two) hours as needed for migraine. May repeat in 2 hours if headache persists or recurs. 10 tablet 2  . topiramate (TOPAMAX) 100 MG tablet Take 1 tablet (100 mg total) by mouth at bedtime. 30 tablet 2  . vitamin C (ASCORBIC ACID) 250 MG tablet Take 250 mg by mouth  daily.     No current facility-administered medications on file prior to visit.     PAST MEDICAL HISTORY: Past Medical History:  Diagnosis Date  . Anxiety   . Hyperlipidemia   . Leg edema   . Migraines     PAST SURGICAL HISTORY: History reviewed. No pertinent surgical history.  SOCIAL HISTORY: Social History   Tobacco Use  . Smoking status: Never Smoker  . Smokeless tobacco: Never Used  Substance Use Topics  . Alcohol use: Yes    Comment: occasional  . Drug use: Never    FAMILY HISTORY: Family History  Problem Relation Age of Onset  . Anxiety disorder Mother   . Diabetes Father   . Obesity Father     ROS: Review of Systems  Cardiovascular: Negative for chest pain.  Gastrointestinal: Negative for diarrhea, nausea and vomiting.  Genitourinary: Negative for frequency.  Musculoskeletal:       Negative for muscle weakness  Endo/Heme/Allergies: Negative for polydipsia.       Negative for polyphagia    PHYSICAL EXAM: Blood pressure 106/76, pulse 69, temperature 98.1 F (36.7 C), temperature source  Oral, height 5\' 3"  (1.6 m), weight 183 lb (83 kg), SpO2 98 %. Body mass index is 32.42 kg/m. Physical Exam Vitals signs reviewed.  Constitutional:      Appearance: Normal appearance. She is well-developed. She is obese.  Cardiovascular:     Rate and Rhythm: Normal rate.  Pulmonary:     Effort: Pulmonary effort is normal.  Musculoskeletal: Normal range of motion.  Skin:    General: Skin is warm and dry.  Neurological:     Mental Status: She is alert and oriented to person, place, and time.  Psychiatric:        Mood and Affect: Mood normal.        Behavior: Behavior normal.     RECENT LABS AND TESTS: BMET    Component Value Date/Time   NA 141 12/19/2017 0806   K 4.4 12/19/2017 0806   CL 108 (H) 12/19/2017 0806   CO2 20 12/19/2017 0806   GLUCOSE 100 (H) 12/19/2017 0806   GLUCOSE 84 03/03/2014 1208   BUN 14 12/19/2017 0806   CREATININE 0.75 12/19/2017  0806   CALCIUM 9.7 12/19/2017 0806   GFRNONAA 107 12/19/2017 0806   GFRAA 124 12/19/2017 0806   Lab Results  Component Value Date   HGBA1C 4.7 (L) 12/19/2017   HGBA1C 5.1 07/26/2017   Lab Results  Component Value Date   INSULIN 18.0 12/19/2017   INSULIN 14.6 07/26/2017   CBC    Component Value Date/Time   WBC 8.2 06/02/2017 0945   RBC 4.78 06/02/2017 0945   HGB 14.5 06/02/2017 0945   HCT 44.3 06/02/2017 0945   PLT 323 06/02/2017 0945   MCV 93 06/02/2017 0945   MCH 30.3 06/02/2017 0945   MCHC 32.7 06/02/2017 0945   RDW 12.3 06/02/2017 0945   LYMPHSABS 2.4 06/02/2017 0945   EOSABS 0.2 06/02/2017 0945   BASOSABS 0.0 06/02/2017 0945   Iron/TIBC/Ferritin/ %Sat No results found for: IRON, TIBC, FERRITIN, IRONPCTSAT Lipid Panel     Component Value Date/Time   CHOL 200 (H) 12/19/2017 0806   TRIG 142 12/19/2017 0806   HDL 40 12/19/2017 0806   CHOLHDL 5.0 (H) 06/02/2017 0945   CHOLHDL 5.1 03/03/2014 1208   VLDL 41 (H) 03/03/2014 1208   LDLCALC 132 (H) 12/19/2017 0806   Hepatic Function Panel     Component Value Date/Time   PROT 6.9 12/19/2017 0806   ALBUMIN 4.4 12/19/2017 0806   AST 14 12/19/2017 0806   ALT 18 12/19/2017 0806   ALKPHOS 64 12/19/2017 0806   BILITOT 0.5 12/19/2017 0806   BILIDIR 0.12 06/02/2017 0945      Component Value Date/Time   TSH 1.850 07/26/2017 1127    Ref. Range 12/19/2017 08:06  Vitamin D, 25-Hydroxy Latest Ref Range: 30.0 - 100.0 ng/mL 28.5 (L)     OBESITY BEHAVIORAL INTERVENTION VISIT  Today's visit was # 14   Starting weight: 199 lbs Starting date: 07/26/2017 Today's weight : 183 lbs Today's date: 04/19/2018 Total lbs lost to date: 16   ASK: We discussed the diagnosis of obesity with Arva Chafeiana M Rossmann today and Alison Davies agreed to give us permission to discuss obesity behavioral modification therapy today.  ASSESS: Alison Davies has the diagnosis of obesity and her BMI today is 32.42 Alison Davies is in the action stage of change    ADVISE: Alison Davies was educated on the multiple health risks of obesity as well as the benefit of weight loss to improve her health. She was advised of the need for long  term treatment and the importance of lifestyle modifications to improve her current health and to decrease her risk of future health problems.  AGREE: Multiple dietary modification options and treatment options were discussed and  Alison Davies agreed to follow the recommendations documented in the above note.  ARRANGE: Alison Davies was educated on the importance of frequent visits to treat obesity as outlined per CMS and USPSTF guidelines and agreed to schedule her next follow up appointment today.  Cristi Loron, am acting as transcriptionist for [name], PA-C I, [name], PA-C have reviewed above note and agree with its content

## 2018-04-20 LAB — COMPREHENSIVE METABOLIC PANEL
ALT: 16 IU/L (ref 0–32)
AST: 13 IU/L (ref 0–40)
Albumin/Globulin Ratio: 1.7 (ref 1.2–2.2)
Albumin: 4.4 g/dL (ref 3.5–5.5)
Alkaline Phosphatase: 71 IU/L (ref 39–117)
BUN/Creatinine Ratio: 20 (ref 9–23)
BUN: 13 mg/dL (ref 6–20)
Bilirubin Total: 0.3 mg/dL (ref 0.0–1.2)
CO2: 20 mmol/L (ref 20–29)
Calcium: 9.5 mg/dL (ref 8.7–10.2)
Chloride: 104 mmol/L (ref 96–106)
Creatinine, Ser: 0.65 mg/dL (ref 0.57–1.00)
GFR calc Af Amer: 138 mL/min/{1.73_m2} (ref >59)
GFR calc non Af Amer: 120 mL/min/{1.73_m2} (ref >59)
Globulin, Total: 2.6 g/dL (ref 1.5–4.5)
Glucose: 93 mg/dL (ref 65–99)
Potassium: 4.4 mmol/L (ref 3.5–5.2)
Sodium: 140 mmol/L (ref 134–144)
Total Protein: 7 g/dL (ref 6.0–8.5)

## 2018-04-20 LAB — HEMOGLOBIN A1C
Est. average glucose Bld gHb Est-mCnc: 94 mg/dL
Hgb A1c MFr Bld: 4.9 % (ref 4.8–5.6)

## 2018-04-20 LAB — LIPID PANEL WITH LDL/HDL RATIO
Cholesterol, Total: 225 mg/dL — ABNORMAL HIGH (ref 100–199)
HDL: 50 mg/dL (ref >39)
LDL Calculated: 152 mg/dL — ABNORMAL HIGH (ref 0–99)
LDl/HDL Ratio: 3 ratio (ref 0.0–3.2)
Triglycerides: 117 mg/dL (ref 0–149)
VLDL Cholesterol Cal: 23 mg/dL (ref 5–40)

## 2018-04-20 LAB — INSULIN, RANDOM: INSULIN: 21.8 u[IU]/mL (ref 2.6–24.9)

## 2018-04-20 LAB — VITAMIN D 25 HYDROXY (VIT D DEFICIENCY, FRACTURES): Vit D, 25-Hydroxy: 35.4 ng/mL (ref 30.0–100.0)

## 2018-05-10 ENCOUNTER — Ambulatory Visit (INDEPENDENT_AMBULATORY_CARE_PROVIDER_SITE_OTHER): Payer: No Typology Code available for payment source | Admitting: Physician Assistant

## 2018-05-10 ENCOUNTER — Encounter (INDEPENDENT_AMBULATORY_CARE_PROVIDER_SITE_OTHER): Payer: Self-pay | Admitting: Physician Assistant

## 2018-05-10 VITALS — BP 103/70 | HR 76 | Temp 98.0°F | Ht 63.0 in | Wt 186.0 lb

## 2018-05-10 DIAGNOSIS — Z6832 Body mass index (BMI) 32.0-32.9, adult: Secondary | ICD-10-CM

## 2018-05-10 DIAGNOSIS — E7849 Other hyperlipidemia: Secondary | ICD-10-CM | POA: Diagnosis not present

## 2018-05-10 DIAGNOSIS — E669 Obesity, unspecified: Secondary | ICD-10-CM

## 2018-05-10 DIAGNOSIS — Z9189 Other specified personal risk factors, not elsewhere classified: Secondary | ICD-10-CM | POA: Diagnosis not present

## 2018-05-10 DIAGNOSIS — E559 Vitamin D deficiency, unspecified: Secondary | ICD-10-CM

## 2018-05-10 MED ORDER — VITAMIN D (ERGOCALCIFEROL) 1.25 MG (50000 UNIT) PO CAPS: 50000.0000 [IU] | ORAL_CAPSULE | ORAL | 0 refills | Status: DC

## 2018-05-10 MED FILL — VIT D2 1.25 MG (50,000 UNIT: 1.25 MG | 28 days supply | Qty: 4 | Fill #0

## 2018-05-11 ENCOUNTER — Ambulatory Visit (INDEPENDENT_AMBULATORY_CARE_PROVIDER_SITE_OTHER): Payer: No Typology Code available for payment source | Admitting: Family Medicine

## 2018-05-11 ENCOUNTER — Encounter: Payer: Self-pay | Admitting: Family Medicine

## 2018-05-11 VITALS — BP 128/98 | HR 75 | Temp 98.6°F | Ht 63.0 in | Wt 186.0 lb

## 2018-05-11 DIAGNOSIS — F411 Generalized anxiety disorder: Secondary | ICD-10-CM

## 2018-05-11 MED ORDER — SERTRALINE HCL 100 MG PO TABS: 100.0000 mg | ORAL_TABLET | Freq: Every day | ORAL | 3 refills | Status: DC

## 2018-05-11 MED ORDER — ALPRAZOLAM 0.25 MG PO TABS: 0.2500 mg | ORAL_TABLET | Freq: Every evening | ORAL | 0 refills | Status: DC | PRN

## 2018-05-11 MED FILL — SERTRALINE HCL 100 MG TAB: 100 | 90 days supply | Qty: 90 | Fill #0

## 2018-05-11 MED FILL — ALPRAZolam 0.25 MG TABS: 0.25 | 30 days supply | Qty: 30 | Fill #0

## 2018-05-11 NOTE — Progress Notes (Signed)
Alison ChafeDiana M Davies is a 33 y.o. female  Chief Complaint  Patient presents with  . Medication Refill    zoloft , xanax    HPI: Alison ChafeDiana M Davies is a 33 y.o. female here for f/u on anxiety for which she is taking zoloft 50mg  daily. She has been on this for 2+ years and thinks it works very well to control her anxiety symptoms. Her dose has not changed in years.  She was previous prescribed xanax 1/2 of 0.25mg  tab qHS PRN. She was taking it very rarely - she had same Rx bottle for 1+ year. She has been out of it and has felt like she needed it the past month.  Pt notes trouble falling and staying asleep. This is not a new issue but she states when her anxiety symptoms increase, then her sleep worsens. She also notes feeling more irritable.   PHQ-9 = 7 GAD-7 = 6 (previously 1 in 01/2018)  Depression screen [facility] 2/9 02/01/2018 01/10/2018 12/20/2017  Decreased Interest 1 0 0  Down, Depressed, Hopeless 1 0 0  PHQ - 2 Score 2 0 0  Altered sleeping 1 1 1   Tired, decreased energy 0 1 1  Change in appetite 1 0 0  Feeling bad or failure about yourself  0 0 0  Trouble concentrating 0 0 0  Moving slowly or fidgety/restless 0 0 0  Suicidal thoughts 0 0 0  PHQ-9 Score 4 2 2   Difficult doing work/chores - - -   GAD 7 : Generalized Anxiety Score 02/01/2018 01/10/2018 12/20/2017 12/06/2017  Nervous, Anxious, on Edge 0 0 1 0  Control/stop worrying 0 0 0 0  Worry too much - different things 0 0 1 1  Trouble relaxing 0 0 0 1  Restless 0 0 1 1  Easily annoyed or irritable 1 0 1 0  Afraid - awful might happen 0 0 0 0  Total GAD 7 Score 1 0 4 3  Anxiety Difficulty Not difficult at all Not difficult at all Somewhat difficult Somewhat difficult    Past Medical History:  Diagnosis Date  . Anxiety   . Hyperlipidemia   . Leg edema   . Migraines     No past surgical history on file.  Social History   Socioeconomic History  . Marital status: Single    Spouse name: Not on file  . Number of children: Not  on file  . Years of education: Not on file  . Highest education level: Not on file  Occupational History  . 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 Financial resource strain: Not on file  . Food insecurity:    Worry: Not on file    Inability: Not on file  . Transportation needs:    Medical: Not on file    Non-medical: Not on file  Tobacco Use  . Smoking status: Never Smoker  . Smokeless tobacco: Never Used  Substance and Sexual Activity  . Alcohol use: Yes    Comment: occasional  . Drug use: Never  . Sexual activity: Not on file  Lifestyle  . Physical activity:    Days per week: Not on file    Minutes per session: Not on file  . Stress: Not on file  Relationships  . Social connections:    Talks on phone: Not on file    Gets together: Not on file    Attends religious service: Not on file  Active member of club or organization: Not on file    Attends meetings of clubs or organizations: Not on file    Relationship status: Not on file  . Intimate partner violence:    Fear of current or ex partner: Not on file    Emotionally abused: Not on file    Physically abused: Not on file    Forced sexual activity: Not on file  Other Topics Concern  . Not on file  Social History Narrative  . Not on file    Family History  Problem Relation Age of Onset  . Anxiety disorder Mother   . Diabetes Father   . Obesity Father      Immunization History  Administered Date(s) Administered  . Influenza-Unspecified 01/17/2014  . PPD Test 02/24/2014, 03/06/2014, 03/27/2015, 03/28/2016  . Tdap 02/17/2014, 03/04/2018    Outpatient Encounter Medications as of 05/11/2018  Medication Sig  . ALPRAZolam (XANAX) 0.25 MG tablet Take 1 tablet (0.25 mg total) by mouth at bedtime as needed for anxiety.  . Biotin 3 MG TABS Take 2 tablets by mouth daily.  . Cyanocobalamin (VITAMIN B-12 CR) 1500 MCG TBCR Take 2 tablets by mouth 2 (two) times daily.  . Melatonin 5 MG CHEW  Chew 2 capsules by mouth at bedtime as needed.  . Multiple Vitamin (MULTIVITAMIN WITH MINERALS) TABS tablet Take 1 tablet by mouth daily.  . norethindrone (MICRONOR,CAMILA,ERRIN) 0.35 MG tablet Take 1 tablet (0.35 mg total) by mouth daily.  . sertraline (ZOLOFT) 100 MG tablet Take 1 tablet (100 mg total) by mouth daily.  . SUMAtriptan (IMITREX) 25 MG tablet Take 1 tablet (25 mg total) by mouth every 2 (two) hours as needed for migraine. May repeat in 2 hours if headache persists or recurs.  . topiramate (TOPAMAX) 100 MG tablet Take 1 tablet (100 mg total) by mouth at bedtime.  . vitamin C (ASCORBIC ACID) 250 MG tablet Take 250 mg by mouth daily.  . Vitamin D, Ergocalciferol, (DRISDOL) 1.25 MG (50000 UT) CAPS capsule Take 1 capsule (50,000 Units total) by mouth every 7 (seven) days.  . [DISCONTINUED] ALPRAZolam (XANAX) 0.25 MG tablet Take 0.25 mg by mouth as needed for anxiety.  . [DISCONTINUED] sertraline (ZOLOFT) 50 MG tablet Take 1 tablet (50 mg total) by mouth daily.   No facility-administered encounter medications on file as of 05/11/2018.      ROS: Pertinent positives and negatives noted in HPI. Remainder of ROS non-contributory  No Known Allergies  BP (!) 128/98   Pulse 75   Temp 98.6 F (37 C) (Oral)   Ht 5\' 3"  (1.6 m)   Wt 186 lb (84.4 kg)   SpO2 99%   BMI 32.95 kg/m   Physical Exam  Constitutional: She is oriented to person, place, and time. She appears well-developed and well-nourished. No distress.  Cardiovascular: Normal rate and regular rhythm.  Pulmonary/Chest: Effort normal and breath sounds normal.  Neurological: She is alert and oriented to person, place, and time.  Psychiatric: She has a normal mood and affect. Her behavior is normal.     A/P:  1. GAD (generalized anxiety disorder) - increase in symptoms and GAD-7 score since 01/2018, GAD-7 = 6 (previously 1 in 01/2018) Increase: - sertraline (ZOLOFT) 100 MG tablet; Take 1 tablet (100 mg total) by mouth  daily.  Dispense: 90 tablet; Refill: 3 - increased from 50mg  to 100mg  Refill: - ALPRAZolam (XANAX) 0.25 MG tablet; Take 1 tablet (0.25 mg total) by mouth at bedtime as needed for  anxiety.  Dispense: 30 tablet; Refill: 0 - Pain Mgmt, Profile 8 w/Conf, U - controlled substance database reviewed and appropriate - controlled substance agreement signed today - f/u in 3-4 wks or sooner PRN Discussed plan and reviewed medications with patient, including risks, benefits, and potential side effects. Pt expressed understand. All questions answered.

## 2018-05-14 NOTE — Progress Notes (Signed)
Office: [phone]  /  Fax: [phone]   HPI:   Chief Complaint: OBESITY Alison Davies is here to discuss her progress with her obesity treatment plan. She is on the keep a food journal with 1500 calories and 95 grams of protein daily plan and is following her eating plan approximately 40 % of the time. She states she is hiking and walking 30 minutes 7 times per week. Alison Davies reports that she struggled to follow her plan over the holidays. She has been having GI upset with fatty foods, but no abdominal pain. She is ready to get back on track. Her weight is 186 lb (84.4 kg) today and has had a weight gain of 3 pounds over a period of 3 weeks since her last visit. She has lost 13 lbs since starting treatment with [name].  Vitamin D deficiency Alison Davies has a diagnosis of vitamin D deficiency. She is currently taking vit D and denies nausea, vomiting or muscle weakness.  At risk for osteopenia and osteoporosis Alison Davies is at higher risk of osteopenia and osteoporosis due to vitamin D deficiency.   Hyperlipidemia Alison Davies has hyperlipidemia and she is not on medications. She has been trying to improve her cholesterol levels with intensive lifestyle modification including a low saturated fat diet, exercise and weight loss. She denies any chest pain.  ASSESSMENT AND PLAN:  Vitamin D deficiency - Plan: Vitamin D, Ergocalciferol, (DRISDOL) 1.25 MG (50000 UT) CAPS capsule  Other hyperlipidemia  At risk for osteoporosis  Class 1 obesity with serious comorbidity and body mass index (BMI) of 32.0 to 32.9 in adult, unspecified obesity type  PLAN:  Vitamin D Deficiency Alison Davies was informed that low vitamin D levels contributes to fatigue and are associated with obesity, breast, and colon cancer. She agrees to continue to take prescription Vit D @50 ,000 IU every week #4 with no refills and will follow up for routine testing of vitamin D, at least 2-3 times per year. She was informed of the risk of over-replacement of  vitamin D and agrees to not increase her dose unless she discusses this with [name] first. Alison Davies agrees to follow up with our clinic in 3 weeks.  At risk for osteopenia and osteoporosis Alison Davies was given extended  (15 minutes) osteoporosis prevention counseling today. Alison Davies is at risk for osteopenia and osteoporosis due to her vitamin D deficiency. She was encouraged to take her vitamin D and follow her higher calcium diet and increase strengthening exercise to help strengthen her bones and decrease her risk of osteopenia and osteoporosis.  Hyperlipidemia Alison Davies was informed of the American Heart Association Guidelines emphasizing intensive lifestyle modifications as the first line treatment for hyperlipidemia. We discussed many lifestyle modifications today in depth, and Alison Davies will continue to work on decreasing saturated fats such as fatty red meat, butter and many fried foods. She will also increase vegetables and lean protein in her diet and continue to work on exercise and weight loss efforts.  Obesity Alison Davies is currently in the action stage of change. As such, her goal is to continue with weight loss efforts She has agreed to keep a food journal with 1500 calories and 95 grams of protein daily Alison Davies has been instructed to work up to a goal of 150 minutes of combined cardio and strengthening exercise per week for weight loss and overall health benefits. We discussed the following Behavioral Modification Strategies today: keeping healthy foods in the home and work on meal planning and easy cooking plans  Alison Davies has agreed  to follow up with our clinic in 3 weeks. She was informed of the importance of frequent follow up visits to maximize her success with intensive lifestyle modifications for her multiple health conditions.  ALLERGIES: No Known Allergies  MEDICATIONS: Current Outpatient Medications on File Prior to Visit  Medication Sig Dispense Refill  . Biotin 3 MG TABS Take 2 tablets by mouth  daily.    . Cyanocobalamin (VITAMIN B-12 CR) 1500 MCG TBCR Take 2 tablets by mouth 2 (two) times daily.    . Melatonin 5 MG CHEW Chew 2 capsules by mouth at bedtime as needed.    . Multiple Vitamin (MULTIVITAMIN WITH MINERALS) TABS tablet Take 1 tablet by mouth daily.    . norethindrone (MICRONOR,CAMILA,ERRIN) 0.35 MG tablet Take 1 tablet (0.35 mg total) by mouth daily. 1 Package 11  . SUMAtriptan (IMITREX) 25 MG tablet Take 1 tablet (25 mg total) by mouth every 2 (two) hours as needed for migraine. May repeat in 2 hours if headache persists or recurs. 10 tablet 2  . topiramate (TOPAMAX) 100 MG tablet Take 1 tablet (100 mg total) by mouth at bedtime. 30 tablet 2  . vitamin C (ASCORBIC ACID) 250 MG tablet Take 250 mg by mouth daily.     No current facility-administered medications on file prior to visit.     PAST MEDICAL HISTORY: Past Medical History:  Diagnosis Date  . Anxiety   . Hyperlipidemia   . Leg edema   . Migraines     PAST SURGICAL HISTORY: History reviewed. No pertinent surgical history.  SOCIAL HISTORY: Social History   Tobacco Use  . Smoking status: Never Smoker  . Smokeless tobacco: Never Used  Substance Use Topics  . Alcohol use: Yes    Comment: occasional  . Drug use: Never    FAMILY HISTORY: Family History  Problem Relation Age of Onset  . Anxiety disorder Mother   . Diabetes Father   . Obesity Father     ROS: Review of Systems  Constitutional: Negative for weight loss.  Cardiovascular: Negative for chest pain.  Gastrointestinal: Negative for nausea and vomiting.  Musculoskeletal:       Negative for muscle weakness    PHYSICAL EXAM: Blood pressure 103/70, pulse 76, temperature 98 F (36.7 C), temperature source Oral, height 5\' 3"  (1.6 m), weight 186 lb (84.4 kg), SpO2 98 %. Body mass index is 32.95 kg/m. Physical Exam Vitals signs reviewed.  Constitutional:      Appearance: Normal appearance. She is well-developed. She is obese.    Cardiovascular:     Rate and Rhythm: Normal rate.  Pulmonary:     Effort: Pulmonary effort is normal.  Musculoskeletal: Normal range of motion.  Skin:    General: Skin is warm and dry.  Neurological:     Mental Status: She is alert and oriented to person, place, and time.  Psychiatric:        Mood and Affect: Mood normal.        Behavior: Behavior normal.     RECENT LABS AND TESTS: BMET    Component Value Date/Time   NA 140 04/19/2018 0924   K 4.4 04/19/2018 0924   CL 104 04/19/2018 0924   CO2 20 04/19/2018 0924   GLUCOSE 93 04/19/2018 0924   GLUCOSE 84 03/03/2014 1208   BUN 13 04/19/2018 0924   CREATININE 0.65 04/19/2018 0924   CALCIUM 9.5 04/19/2018 0924   GFRNONAA 120 04/19/2018 0924   GFRAA 138 04/19/2018 0924   Lab Results  Component Value Date   HGBA1C 4.9 04/19/2018   HGBA1C 4.7 (L) 12/19/2017   HGBA1C 5.1 07/26/2017   Lab Results  Component Value Date   INSULIN 21.8 04/19/2018   INSULIN 18.0 12/19/2017   INSULIN 14.6 07/26/2017   CBC    Component Value Date/Time   WBC 8.2 06/02/2017 0945   RBC 4.78 06/02/2017 0945   HGB 14.5 06/02/2017 0945   HCT 44.3 06/02/2017 0945   PLT 323 06/02/2017 0945   MCV 93 06/02/2017 0945   MCH 30.3 06/02/2017 0945   MCHC 32.7 06/02/2017 0945   RDW 12.3 06/02/2017 0945   LYMPHSABS 2.4 06/02/2017 0945   EOSABS 0.2 06/02/2017 0945   BASOSABS 0.0 06/02/2017 0945   Iron/TIBC/Ferritin/ %Sat No results found for: IRON, TIBC, FERRITIN, IRONPCTSAT Lipid Panel     Component Value Date/Time   CHOL 225 (H) 04/19/2018 0924   TRIG 117 04/19/2018 0924   HDL 50 04/19/2018 0924   CHOLHDL 5.0 (H) 06/02/2017 0945   CHOLHDL 5.1 03/03/2014 1208   VLDL 41 (H) 03/03/2014 1208   LDLCALC 152 (H) 04/19/2018 0924   Hepatic Function Panel     Component Value Date/Time   PROT 7.0 04/19/2018 0924   ALBUMIN 4.4 04/19/2018 0924   AST 13 04/19/2018 0924   ALT 16 04/19/2018 0924   ALKPHOS 71 04/19/2018 0924   BILITOT 0.3 04/19/2018  0924   BILIDIR 0.12 06/02/2017 0945      Component Value Date/Time   TSH 1.850 07/26/2017 1127     Ref. Range 04/19/2018 09:24  Vitamin D, 25-Hydroxy Latest Ref Range: 30.0 - 100.0 ng/mL 35.4     OBESITY BEHAVIORAL INTERVENTION VISIT  Today's visit was # 15   Starting weight: 199 lbs Starting date: 07/26/2017 Today's weight : 186 lbs Today's date: 05/10/2018 Total lbs lost to date: 13   ASK: We discussed the diagnosis of obesity with Alison Davies today and Alison Davies agreed to give [name] permission to discuss obesity behavioral modification therapy today.  ASSESS: Elynore has the diagnosis of obesity and her BMI today is 32.96 Alison Davies is in the action stage of change   ADVISE: Byanca was educated on the multiple health risks of obesity as well as the benefit of weight loss to improve her health. She was advised of the need for long term treatment and the importance of lifestyle modifications to improve her current health and to decrease her risk of future health problems.  AGREE: Multiple dietary modification options and treatment options were discussed and  Alison Davies agreed to follow the recommendations documented in the above note.  ARRANGE: Alison Davies was educated on the importance of frequent visits to treat obesity as outlined per CMS and USPSTF guidelines and agreed to schedule her next follow up appointment today.  Cristi Loron, am acting as transcriptionist for [name], PA-C I, [name], PA-C have reviewed above note and agree with its content

## 2018-05-16 ENCOUNTER — Other Ambulatory Visit: Payer: Self-pay | Admitting: Family Medicine

## 2018-05-16 ENCOUNTER — Encounter: Payer: Self-pay | Admitting: Family Medicine

## 2018-05-16 MED ORDER — TOPIRAMATE 100 MG PO TABS: 100.0000 mg | ORAL_TABLET | Freq: Every day | ORAL | 3 refills | Status: DC

## 2018-05-16 MED FILL — TOPIRAMATE 100 MG TABLET: 100 | 90 days supply | Qty: 90 | Fill #0

## 2018-05-16 NOTE — Progress Notes (Signed)
topamax refill sent as requested by pt

## 2018-05-19 LAB — PAIN MGMT, PROFILE 8 W/CONF, U
6 Acetylmorphine: NEGATIVE ng/mL (ref <10)
Alcohol Metabolites: NEGATIVE ng/mL (ref <500)
Amphetamines: NEGATIVE ng/mL (ref <500)
Benzodiazepines: NEGATIVE ng/mL (ref <100)
Buprenorphine, Urine: NEGATIVE ng/mL (ref <5)
Cocaine Metabolite: NEGATIVE ng/mL (ref <150)
Codeine: 93 ng/mL — ABNORMAL HIGH (ref <50)
Creatinine: 114.4 mg/dL
Ethyl Glucuronide (ETG): NEGATIVE ng/mL (ref <500)
Ethyl Sulfate (ETS): NEGATIVE ng/mL (ref <100)
Hydrocodone: NEGATIVE ng/mL (ref <50)
Hydromorphone: NEGATIVE ng/mL (ref <50)
MDMA: NEGATIVE ng/mL (ref <500)
Marijuana Metabolite: NEGATIVE ng/mL (ref <20)
Morphine: NEGATIVE ng/mL (ref <50)
Norhydrocodone: NEGATIVE ng/mL (ref <50)
Opiates: POSITIVE ng/mL — AB (ref <100)
Oxidant: NEGATIVE ug/mL (ref <200)
Oxycodone: NEGATIVE ng/mL (ref <100)
pH: 6.28 (ref 4.5–9.0)

## 2018-05-31 ENCOUNTER — Ambulatory Visit (INDEPENDENT_AMBULATORY_CARE_PROVIDER_SITE_OTHER): Payer: Self-pay | Admitting: Physician Assistant

## 2018-06-05 ENCOUNTER — Ambulatory Visit: Payer: Self-pay | Admitting: Family Medicine

## 2018-06-07 ENCOUNTER — Encounter: Payer: Self-pay | Admitting: Family Medicine

## 2018-06-07 ENCOUNTER — Ambulatory Visit (INDEPENDENT_AMBULATORY_CARE_PROVIDER_SITE_OTHER): Payer: No Typology Code available for payment source | Admitting: Family Medicine

## 2018-06-07 VITALS — BP 108/78 | HR 77 | Temp 97.8°F | Ht 63.0 in | Wt 188.0 lb

## 2018-06-07 DIAGNOSIS — F411 Generalized anxiety disorder: Secondary | ICD-10-CM | POA: Diagnosis not present

## 2018-06-07 DIAGNOSIS — Z0000 Encounter for general adult medical examination without abnormal findings: Secondary | ICD-10-CM | POA: Diagnosis not present

## 2018-06-07 MED ORDER — NORETHINDRONE 0.35 MG PO TABS: 1.0000 | ORAL_TABLET | Freq: Every day | ORAL | 3 refills | Status: DC

## 2018-06-07 MED FILL — NORETHINDRONE 0.35 MG TAB: 0.35 | 84 days supply | Qty: 84 | Fill #0

## 2018-06-07 NOTE — Progress Notes (Signed)
Alison Davies is a 33 y.o. female  Chief Complaint  Patient presents with  . Annual Exam    pt here CPE , pt last pap was last year. she is concerned about her high cholesterol    HPI: Alison Davies is a 33 y.o. female here for her annual CPE. Her PAP is UTD. She had labs done in 04/2018 to include lipid panel, A1C, and CMP.  Pt has GAD and at last OV on 05/11/18 we increased zoloft from 50mg  to 100mg  daily. She noted increased fatigue for the first few days taking the 100mg  but that has resolved. She feels she is better able to manage her stress and not get "flustered" or "overly emotional" and has been able to "push through" and "manage" certain situations.   Last PAP: 2019 - normal - due in 2022 (done at [facility] - pt cannot recall office or provider name) Last mammo: n/a Last Dexa: n/a Last colonoscopy: n/a  Diet/Exercise: no exercise  Med refills needed today? OCP   Past Medical History:  Diagnosis Date  . Anxiety   . Hyperlipidemia   . Leg edema   . Migraines     No past surgical history on file.  Social History   Socioeconomic History  . Marital status: Single    Spouse name: Not on file  . Number of children: Not on file  . Years of education: Not on file  . Highest education level: Not on file  Occupational History  . Occupation: Rad Theme park manager: [employer]  Social Needs  . Financial resource strain: Not on file  . Food insecurity:    Worry: Not on file    Inability: Not on file  . Transportation needs:    Medical: Not on file    Non-medical: Not on file  Tobacco Use  . Smoking status: Never Smoker  . Smokeless tobacco: Never Used  Substance and Sexual Activity  . Alcohol use: Yes    Comment: occasional  . Drug use: Never  . Sexual activity: Not on file  Lifestyle  . Physical activity:    Days per week: Not on file    Minutes per session: Not on file  . Stress: Not on file  Relationships  . Social connections:    Talks on phone: Not  on file    Gets together: Not on file    Attends religious service: Not on file    Active member of club or organization: Not on file    Attends meetings of clubs or organizations: Not on file    Relationship status: Not on file  . Intimate partner violence:    Fear of current or ex partner: Not on file    Emotionally abused: Not on file    Physically abused: Not on file    Forced sexual activity: Not on file  Other Topics Concern  . Not on file  Social History Narrative  . Not on file    Family History  Problem Relation Age of Onset  . Anxiety disorder Mother   . Diabetes Father   . Obesity Father      Immunization History  Administered Date(s) Administered  . Influenza-Unspecified 01/17/2014  . PPD Test 02/24/2014, 03/06/2014, 03/27/2015, 03/28/2016  . Tdap 02/17/2014, 03/04/2018    Outpatient Encounter Medications as of 06/07/2018  Medication Sig  . ALPRAZolam (XANAX) 0.25 MG tablet Take 1 tablet (0.25 mg total) by mouth at bedtime as needed for anxiety.  Marland Kitchen  Biotin 3 MG TABS Take 2 tablets by mouth daily.  . Cyanocobalamin (VITAMIN B-12 CR) 1500 MCG TBCR Take 2 tablets by mouth 2 (two) times daily.  . Melatonin 5 MG CHEW Chew 2 capsules by mouth at bedtime as needed.  . Multiple Vitamin (MULTIVITAMIN WITH MINERALS) TABS tablet Take 1 tablet by mouth daily.  . norethindrone (MICRONOR,CAMILA,ERRIN) 0.35 MG tablet Take 1 tablet (0.35 mg total) by mouth daily.  . sertraline (ZOLOFT) 100 MG tablet Take 1 tablet (100 mg total) by mouth daily.  . SUMAtriptan (IMITREX) 25 MG tablet Take 1 tablet (25 mg total) by mouth every 2 (two) hours as needed for migraine. May repeat in 2 hours if headache persists or recurs.  . topiramate (TOPAMAX) 100 MG tablet Take 1 tablet (100 mg total) by mouth at bedtime.  . vitamin C (ASCORBIC ACID) 250 MG tablet Take 250 mg by mouth daily.  . Vitamin D, Ergocalciferol, (DRISDOL) 1.25 MG (50000 UT) CAPS capsule Take 1 capsule (50,000 Units total) by  mouth every 7 (seven) days.  . [DISCONTINUED] norethindrone (MICRONOR,CAMILA,ERRIN) 0.35 MG tablet Take 1 tablet (0.35 mg total) by mouth daily.   No facility-administered encounter medications on file as of 06/07/2018.      ROS: Pertinent positives and negatives noted in HPI. Remainder of ROS non-contributory  No Known Allergies  BP 108/78   Pulse 77   Temp 97.8 F (36.6 C) (Oral)   Ht 5\' 3"  (1.6 m)   Wt 188 lb (85.3 kg)   SpO2 97%   BMI 33.30 kg/m   Physical Exam  Constitutional: She is oriented to person, place, and time. She appears well-developed and well-nourished. No distress.  HENT:  Head: Normocephalic and atraumatic.  Right Ear: Tympanic membrane and ear canal normal.  Left Ear: Tympanic membrane and ear canal normal.  Nose: Nose normal.  Mouth/Throat: Oropharynx is clear and moist.  Neck: Neck supple. No thyromegaly present.  Cardiovascular: Normal rate, regular rhythm and normal heart sounds.  No murmur heard. Pulmonary/Chest: Effort normal and breath sounds normal. No respiratory distress.  Abdominal: Soft. Bowel sounds are normal. She exhibits no distension and no mass. There is no abdominal tenderness.  Musculoskeletal: Normal range of motion.        General: No edema.  Lymphadenopathy:    She has no cervical adenopathy.  Neurological: She is alert and oriented to person, place, and time. No cranial nerve deficit.  Skin: Skin is warm and dry.  Psychiatric: She has a normal mood and affect. Her behavior is normal.     A/P:  1. Annual physical exam - UTD on labs, PAP, immunizations - strongly encouraged regular CV exercise to help with weight loss, reduce anxiety - encouraged pt to work to improve diet - limit portion sizes, limit/eliminate fast food, stop eating late at night - next CPE in 1 year    2. GAD (generalized anxiety disorder) - symptoms improved on 100mg  zoloft, cont on this - ok with PRN xanax which pt has not taken since last OV

## 2018-06-08 ENCOUNTER — Other Ambulatory Visit: Payer: Self-pay

## 2018-06-08 ENCOUNTER — Emergency Department (HOSPITAL_BASED_OUTPATIENT_CLINIC_OR_DEPARTMENT_OTHER)
Admission: EM | Admit: 2018-06-08 | Discharge: 2018-06-08 | Disposition: A | Discharge status: Home / Self Care | Payer: No Typology Code available for payment source | Attending: Emergency Medicine | Admitting: Emergency Medicine

## 2018-06-08 ENCOUNTER — Encounter (HOSPITAL_BASED_OUTPATIENT_CLINIC_OR_DEPARTMENT_OTHER): Payer: Self-pay | Admitting: *Deleted

## 2018-06-08 DIAGNOSIS — Z79899 Other long term (current) drug therapy: Secondary | ICD-10-CM | POA: Insufficient documentation

## 2018-06-08 DIAGNOSIS — M25521 Pain in right elbow: Secondary | ICD-10-CM

## 2018-06-08 DIAGNOSIS — Y939 Activity, unspecified: Secondary | ICD-10-CM | POA: Diagnosis not present

## 2018-06-08 DIAGNOSIS — Y929 Unspecified place or not applicable: Secondary | ICD-10-CM | POA: Diagnosis not present

## 2018-06-08 DIAGNOSIS — Y999 Unspecified external cause status: Secondary | ICD-10-CM | POA: Diagnosis not present

## 2018-06-08 DIAGNOSIS — S29012A Strain of muscle and tendon of back wall of thorax, initial encounter: Secondary | ICD-10-CM | POA: Insufficient documentation

## 2018-06-08 DIAGNOSIS — S46811A Strain of other muscles, fascia and tendons at shoulder and upper arm level, right arm, initial encounter: Secondary | ICD-10-CM

## 2018-06-08 NOTE — Discharge Instructions (Addendum)
Take 4 over the counter ibuprofen tablets 3 times a day or 2 over-the-counter naproxen tablets twice a day for pain. Also take tylenol 1000mg (2 extra strength) four times a day.   You have to take your arm out of the sling at least four times a day and do range of motion exercises with your shoulder.   As discussed return for confusion, persistent vomiting, shortness of breath or abdominal pain.  It is normal to hurt worse tomorrow and to hurt everywhere.
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
## 2018-06-14 ENCOUNTER — Ambulatory Visit (INDEPENDENT_AMBULATORY_CARE_PROVIDER_SITE_OTHER): Payer: No Typology Code available for payment source | Admitting: Physician Assistant

## 2018-06-14 ENCOUNTER — Encounter (INDEPENDENT_AMBULATORY_CARE_PROVIDER_SITE_OTHER): Payer: Self-pay | Admitting: Physician Assistant

## 2018-06-14 VITALS — BP 104/68 | HR 72 | Temp 98.2°F | Ht 63.0 in | Wt 181.0 lb

## 2018-06-14 DIAGNOSIS — Z9189 Other specified personal risk factors, not elsewhere classified: Secondary | ICD-10-CM

## 2018-06-14 DIAGNOSIS — E559 Vitamin D deficiency, unspecified: Secondary | ICD-10-CM

## 2018-06-14 DIAGNOSIS — E669 Obesity, unspecified: Secondary | ICD-10-CM

## 2018-06-14 DIAGNOSIS — Z6832 Body mass index (BMI) 32.0-32.9, adult: Secondary | ICD-10-CM

## 2018-06-14 DIAGNOSIS — E66811 Obesity, class 1: Secondary | ICD-10-CM

## 2018-06-14 DIAGNOSIS — E7849 Other hyperlipidemia: Secondary | ICD-10-CM | POA: Diagnosis not present

## 2018-06-14 MED ORDER — VITAMIN D (ERGOCALCIFEROL) 1.25 MG (50000 UNIT) PO CAPS: 50000.0000 [IU] | ORAL_CAPSULE | ORAL | 0 refills | Status: DC

## 2018-06-14 MED FILL — VIT D2 1.25 MG (50,000 UNIT: 1.25 MG | 28 days supply | Qty: 4 | Fill #0

## 2018-06-14 NOTE — Progress Notes (Signed)
Office: [phone]  /  Fax: [phone]   HPI:   Chief Complaint: OBESITY Alison Davies is here to discuss her progress with her obesity treatment plan. She is on the  follow the Category 3 plan + keeping a food journal with 1500 calories and 95 grams of protein daily and is following her eating plan approximately 40% of the time. She states she is exercising 0 minutes 0 times per week. Alison Davies did well with weight loss. She reports that her mood has been down and she has not felt like eating. She also has not been drinking enough water.   Her weight is 181 lb (82.1 kg) today and has had a weight loss of 5 pounds over a period of 5 weeks since her last visit. She has lost 18 lbs since starting treatment with us.  Vitamin D deficiency Alison Davies has a diagnosis of Vitamin D deficiency. She is currently taking prescription Vit D and denies nausea, vomiting or muscle weakness.  Hyperlipidemia Alison Davies has hyperlipidemia and has been trying to improve her cholesterol levels with intensive lifestyle modification including a low saturated fat diet, exercise and weight loss. Alison Davies is not on any medications. She denies any chest pain.  At risk for osteopenia and osteoporosis Alison Davies is at higher risk of osteopenia and osteoporosis due to vitamin D deficiency.   ASSESSMENT AND PLAN:  Vitamin D deficiency - Plan: Vitamin D, Ergocalciferol, (DRISDOL) 1.25 MG (50000 UT) CAPS capsule  Other hyperlipidemia  At risk for osteoporosis  Class 1 obesity with serious comorbidity and body mass index (BMI) of 32.0 to 32.9 in adult, unspecified obesity type  PLAN:  Vitamin D Deficiency Alison Davies was informed that low vitamin D levels contributes to fatigue and are associated with obesity, breast, and colon cancer. She agrees to continue to take prescription Vit D @ 50,000 IU every week #4 with no refills and will follow-up for routine testing of Vitamin D, at least 2-3 times per year. She was informed of the risk of  over-replacement of Vitamin D and agrees to not increase her dose unless she discusses this with us first. Alison Davies agrees to follow-up with our clinic in 4 weeks.  Hyperlipidemia Alison Davies was informed of the American Heart Association Guidelines emphasizing intensive lifestyle modifications as the first line treatment for hyperlipidemia. We discussed many lifestyle modifications today in depth, and Alison Davies will continue to work on decreasing saturated fats such as fatty red meat, butter and many fried foods. She will also increase vegetables and lean protein in her diet and continue to work on exercise and weight loss efforts.  At risk for osteopenia and osteoporosis Alison Davies was given extended  (15 minutes) osteoporosis prevention counseling today. Alison Davies is at risk for osteopenia and osteoporsis due to her Vvitamin D deficiency. She was encouraged to take her Vitamin D and follow her higher calcium diet and increase strengthening exercise to help strengthen her bones and decrease her risk of osteopenia and osteoporosis.  Obesity Alison Davies is currently in the action stage of change. As such, her goal is to continue with weight loss efforts. She has agreed to keep a food journal with 1500 calories and 95 grams of protein daily. Alison Davies has been instructed to work up to a goal of 150 minutes of combined cardio and strengthening exercise per week for weight loss and overall health benefits. We discussed the following Behavioral Modification Strategies today: Increase water intake, no skipping meals, and work on meal planning and easy cooking plans.  Alison Davies has  agreed to follow up with our clinic in 4 weeks. She was informed of the importance of frequent follow up visits to maximize her success with intensive lifestyle modifications for her multiple health conditions.  ALLERGIES: No Known Allergies  MEDICATIONS: Current Outpatient Medications on File Prior to Visit  Medication Sig Dispense Refill  . ALPRAZolam  (XANAX) 0.25 MG tablet Take 1 tablet (0.25 mg total) by mouth at bedtime as needed for anxiety. 30 tablet 0  . Biotin 3 MG TABS Take 2 tablets by mouth daily.    . Cyanocobalamin (VITAMIN B-12 CR) 1500 MCG TBCR Take 2 tablets by mouth 2 (two) times daily.    . Melatonin 5 MG CHEW Chew 2 capsules by mouth at bedtime as needed.    . Multiple Vitamin (MULTIVITAMIN WITH MINERALS) TABS tablet Take 1 tablet by mouth daily.    . norethindrone (MICRONOR,CAMILA,ERRIN) 0.35 MG tablet Take 1 tablet (0.35 mg total) by mouth daily. 3 Package 3  . sertraline (ZOLOFT) 100 MG tablet Take 1 tablet (100 mg total) by mouth daily. 90 tablet 3  . SUMAtriptan (IMITREX) 25 MG tablet Take 1 tablet (25 mg total) by mouth every 2 (two) hours as needed for migraine. May repeat in 2 hours if headache persists or recurs. 10 tablet 2  . topiramate (TOPAMAX) 100 MG tablet Take 1 tablet (100 mg total) by mouth at bedtime. 90 tablet 3  . vitamin C (ASCORBIC ACID) 250 MG tablet Take 250 mg by mouth daily.     No current facility-administered medications on file prior to visit.     PAST MEDICAL HISTORY: Past Medical History:  Diagnosis Date  . Anxiety   . Hyperlipidemia   . Leg edema   . Migraines     PAST SURGICAL HISTORY: History reviewed. No pertinent surgical history.  SOCIAL HISTORY: Social History   Tobacco Use  . Smoking status: Never Smoker  . Smokeless tobacco: Never Used  Substance Use Topics  . Alcohol use: Yes    Comment: occasional  . Drug use: Never    FAMILY HISTORY: Family History  Problem Relation Age of Onset  . Anxiety disorder Mother   . Diabetes Father   . Obesity Father    ROS: Review of Systems  Constitutional: Positive for weight loss.  Cardiovascular: Negative for chest pain.  Gastrointestinal: Negative for nausea and vomiting.  Musculoskeletal:       Negative for muscle weakness.  Endo/Heme/Allergies:       Negative for hypoglycemia.   PHYSICAL EXAM: Blood pressure  104/68, pulse 72, temperature 98.2 F (36.8 C), temperature source Oral, height 5\' 3"  (1.6 m), weight 181 lb (82.1 kg), SpO2 98 %. Body mass index is 32.06 kg/m. Physical Exam Vitals signs reviewed.  Constitutional:      Appearance: Normal appearance. She is obese.  Cardiovascular:     Rate and Rhythm: Normal rate.     Pulses: Normal pulses.  Pulmonary:     Effort: Pulmonary effort is normal.     Breath sounds: Normal breath sounds.  Musculoskeletal: Normal range of motion.  Skin:    General: Skin is warm and dry.  Neurological:     Mental Status: She is alert and oriented to person, place, and time.  Psychiatric:        Behavior: Behavior normal.   RECENT LABS AND TESTS: BMET    Component Value Date/Time   NA 140 04/19/2018 0924   K 4.4 04/19/2018 0924   CL 104 04/19/2018 2334  CO2 20 04/19/2018 0924   GLUCOSE 93 04/19/2018 0924   GLUCOSE 84 03/03/2014 1208   BUN 13 04/19/2018 0924   CREATININE 0.65 04/19/2018 0924   CALCIUM 9.5 04/19/2018 0924   GFRNONAA 120 04/19/2018 0924   GFRAA 138 04/19/2018 0924   Lab Results  Component Value Date   HGBA1C 4.9 04/19/2018   HGBA1C 4.7 (L) 12/19/2017   HGBA1C 5.1 07/26/2017   Lab Results  Component Value Date   INSULIN 21.8 04/19/2018   INSULIN 18.0 12/19/2017   INSULIN 14.6 07/26/2017   CBC    Component Value Date/Time   WBC 8.2 06/02/2017 0945   RBC 4.78 06/02/2017 0945   HGB 14.5 06/02/2017 0945   HCT 44.3 06/02/2017 0945   PLT 323 06/02/2017 0945   MCV 93 06/02/2017 0945   MCH 30.3 06/02/2017 0945   MCHC 32.7 06/02/2017 0945   RDW 12.3 06/02/2017 0945   LYMPHSABS 2.4 06/02/2017 0945   EOSABS 0.2 06/02/2017 0945   BASOSABS 0.0 06/02/2017 0945   Iron/TIBC/Ferritin/ %Sat No results found for: IRON, TIBC, FERRITIN, IRONPCTSAT Lipid Panel     Component Value Date/Time   CHOL 225 (H) 04/19/2018 0924   TRIG 117 04/19/2018 0924   HDL 50 04/19/2018 0924   CHOLHDL 5.0 (H) 06/02/2017 0945   CHOLHDL 5.1  03/03/2014 1208   VLDL 41 (H) 03/03/2014 1208   LDLCALC 152 (H) 04/19/2018 0924   Hepatic Function Panel     Component Value Date/Time   PROT 7.0 04/19/2018 0924   ALBUMIN 4.4 04/19/2018 0924   AST 13 04/19/2018 0924   ALT 16 04/19/2018 0924   ALKPHOS 71 04/19/2018 0924   BILITOT 0.3 04/19/2018 0924   BILIDIR 0.12 06/02/2017 0945      Component Value Date/Time   TSH 1.850 07/26/2017 1127   Results for Arva ChafeGARCIA, Beena M (MRN [id number]) as of 06/14/2018 10:22  Ref. Range 04/19/2018 09:24  Vitamin D, 25-Hydroxy Latest Ref Range: 30.0 - 100.0 ng/mL 35.4   OBESITY BEHAVIORAL INTERVENTION VISIT  Today's visit was #16  Starting weight: 199 lbs Starting date: 07/26/2017 Today's weight: 181 lbs Today's date: 06/14/2018 Total lbs lost to date: 3118  ASK: We discussed the diagnosis of obesity with Arva Chafeiana M Chancellor today and Alison Davies agreed to give us permission to discuss obesity behavioral modification therapy today.  ASSESS: Alison Davies has the diagnosis of obesity and her BMI today is 32.06. Alison Davies is in the action stage of change.   ADVISE: Alison Davies was educated on the multiple health risks of obesity as well as the benefit of weight loss to improve her health. She was advised of the need for long term treatment and the importance of lifestyle modifications to improve her current health and to decrease her risk of future health problems.  AGREE: Multiple dietary modification options and treatment options were discussed and  Alison Davies agreed to follow the recommendations documented in the above note.  ARRANGE: Alison Davies was educated on the importance of frequent visits to treat obesity as outlined per CMS and USPSTF guidelines and agreed to schedule her next follow up appointment today.  Fernanda DrumI, Denise Haag, am acting as transcriptionist for Alois Clicheracey Hadyn Blanck, PA-C I, Alois Clicheracey Yoandri Congrove, PA-C have reviewed above note and agree with its content

## 2018-07-12 ENCOUNTER — Encounter (INDEPENDENT_AMBULATORY_CARE_PROVIDER_SITE_OTHER): Payer: Self-pay | Admitting: Physician Assistant

## 2018-07-12 ENCOUNTER — Other Ambulatory Visit: Payer: Self-pay

## 2018-07-12 ENCOUNTER — Ambulatory Visit (INDEPENDENT_AMBULATORY_CARE_PROVIDER_SITE_OTHER): Payer: No Typology Code available for payment source | Admitting: Physician Assistant

## 2018-07-12 VITALS — BP 130/84 | HR 72 | Temp 97.6°F | Ht 63.0 in | Wt 186.0 lb

## 2018-07-12 DIAGNOSIS — E559 Vitamin D deficiency, unspecified: Secondary | ICD-10-CM | POA: Diagnosis not present

## 2018-07-12 DIAGNOSIS — E669 Obesity, unspecified: Secondary | ICD-10-CM | POA: Diagnosis not present

## 2018-07-12 DIAGNOSIS — E8881 Metabolic syndrome: Secondary | ICD-10-CM | POA: Diagnosis not present

## 2018-07-12 DIAGNOSIS — Z6833 Body mass index (BMI) 33.0-33.9, adult: Secondary | ICD-10-CM

## 2018-07-12 DIAGNOSIS — Z9189 Other specified personal risk factors, not elsewhere classified: Secondary | ICD-10-CM

## 2018-07-12 MED ORDER — VITAMIN D (ERGOCALCIFEROL) 1.25 MG (50000 UNIT) PO CAPS: 50000.0000 [IU] | ORAL_CAPSULE | ORAL | 0 refills | Status: DC

## 2018-07-12 MED FILL — VIT D2 1.25 MG (50,000 UNIT: 1.25 MG | 28 days supply | Qty: 4 | Fill #0

## 2018-07-12 NOTE — Progress Notes (Signed)
Office: [phone]  /  Fax: [phone]   HPI:   Chief Complaint: OBESITY Alison Davies is here to discuss her progress with her obesity treatment plan. She is keeping a food journal with 1500 calories and 95 grams of protein and is following her eating plan approximately 40% of the time. She states she is exercising 0 minutes 0 times per week. Alison Davies reports that she has been working extra hours at work and has not been meal planning. She states she is feeling less motivated currently. Her weight is 186 lb (84.4 kg) today and has had a weight gain of 5 lbs since her last visit. She has lost 13 lbs since starting treatment with [name].  Vitamin D deficiency Alison Davies has a diagnosis of Vitamin D deficiency. She is currently taking prescription Vit D and denies nausea, vomiting or muscle weakness.  At risk for osteopenia and osteoporosis Alison Davies is at higher risk of osteopenia and osteoporosis due to Vitamin D deficiency.   Insulin Resistance Alison Davies has a diagnosis of insulin resistance based on her elevated fasting insulin level >5. Although Alison Davies's blood glucose readings are still under good control, insulin resistance puts herat greater risk of metabolic syndrome and diabetes. She is not taking metformin currently and continues to work on diet and exercise to decrease risk of diabetes. She denies polyphagia.  ASSESSMENT AND PLAN:  Vitamin D deficiency - Plan: Vitamin D, Ergocalciferol, (DRISDOL) 1.25 MG (50000 UT) CAPS capsule  Insulin resistance  At risk for osteoporosis  Class 1 obesity with serious comorbidity and body mass index (BMI) of 33.0 to 33.9 in adult, unspecified obesity type  PLAN:  Vitamin D Deficiency Alison Davies was informed that low Vitamin D levels contributes to fatigue and are associated with obesity, breast, and colon cancer. She agrees to continue to take prescription Vit D @ 50,000 IU every week #4 with 0 refills and will follow-up for routine testing of Vitamin D, at least 2-3  times per year. She was informed of the risk of over-replacement of Vitamin D and agrees to not increase her dose unless she discusses this with [name] first. Alison Davies agrees to follow-up with our clinic in 3 weeks.  At risk for osteopenia and osteoporosis Alison Davies was given extended  (15 minutes) osteoporosis prevention counseling today. Alison Davies is at risk for osteopenia and osteoporsis due to her Vitamin D deficiency. She was encouraged to take her Vitamin D and follow her higher calcium diet and increase strengthening exercise to help strengthen her bones and decrease her risk of osteopenia and osteoporosis.  Insulin Resistance Alison Davies will continue to work on weight loss, exercise, and decreasing simple carbohydrates in her diet to help decrease the risk of diabetes. We dicussed metformin including benefits and risks. She was informed that eating too many simple carbohydrates or too many calories at one sitting increases the likelihood of GI side effects. Alison Davies is currently not on metformin and prescription was not written today. Alison Davies agreed to follow-up with [name] as directed to monitor her progress.  Obesity Alison Davies is currently in the action stage of change. As such, her goal is to continue with weight loss efforts. She has agreed to keep a food journal with 1500 calories and 95 grams of protein daily. Alison Davies has been instructed to work up to a goal of 150 minutes of combined cardio and strengthening exercise per week for weight loss and overall health benefits. We discussed the following Behavioral Modification Strategies today: no skipping meals and work on meal planning and  easy cooking plans.  Alison Davies has agreed to follow-up with our clinic in 3 weeks. She was informed of the importance of frequent follow-up visits to maximize her success with intensive lifestyle modifications for her multiple health conditions.  ALLERGIES: No Known Allergies  MEDICATIONS: Current Outpatient Medications on File Prior to  Visit  Medication Sig Dispense Refill  . ALPRAZolam (XANAX) 0.25 MG tablet Take 1 tablet (0.25 mg total) by mouth at bedtime as needed for anxiety. 30 tablet 0  . Biotin 3 MG TABS Take 2 tablets by mouth daily.    . Cyanocobalamin (VITAMIN B-12 CR) 1500 MCG TBCR Take 2 tablets by mouth 2 (two) times daily.    . Melatonin 5 MG CHEW Chew 2 capsules by mouth at bedtime as needed.    . Multiple Vitamin (MULTIVITAMIN WITH MINERALS) TABS tablet Take 1 tablet by mouth daily.    . norethindrone (MICRONOR,CAMILA,ERRIN) 0.35 MG tablet Take 1 tablet (0.35 mg total) by mouth daily. 3 Package 3  . sertraline (ZOLOFT) 100 MG tablet Take 1 tablet (100 mg total) by mouth daily. 90 tablet 3  . SUMAtriptan (IMITREX) 25 MG tablet Take 1 tablet (25 mg total) by mouth every 2 (two) hours as needed for migraine. May repeat in 2 hours if headache persists or recurs. 10 tablet 2  . topiramate (TOPAMAX) 100 MG tablet Take 1 tablet (100 mg total) by mouth at bedtime. 90 tablet 3  . vitamin C (ASCORBIC ACID) 250 MG tablet Take 250 mg by mouth daily.     No current facility-administered medications on file prior to visit.     PAST MEDICAL HISTORY: Past Medical History:  Diagnosis Date  . Anxiety   . Hyperlipidemia   . Leg edema   . Migraines     PAST SURGICAL HISTORY: History reviewed. No pertinent surgical history.  SOCIAL HISTORY: Social History   Tobacco Use  . Smoking status: Never Smoker  . Smokeless tobacco: Never Used  Substance Use Topics  . Alcohol use: Yes    Comment: occasional  . Drug use: Never    FAMILY HISTORY: Family History  Problem Relation Age of Onset  . Anxiety disorder Mother   . Diabetes Father   . Obesity Father    ROS: Review of Systems  Constitutional: Negative for weight loss.  Gastrointestinal: Negative for nausea and vomiting.  Musculoskeletal:       Negative for muscle weakness.  Endo/Heme/Allergies:       Negative for hypoglycemia.   PHYSICAL EXAM: Blood  pressure 130/84, pulse 72, temperature 97.6 F (36.4 C), temperature source Oral, height 5\' 3"  (1.6 m), weight 186 lb (84.4 kg), SpO2 98 %. Body mass index is 32.95 kg/m. Physical Exam Vitals signs reviewed.  Constitutional:      Appearance: Normal appearance. She is obese.  Cardiovascular:     Rate and Rhythm: Normal rate.     Pulses: Normal pulses.  Pulmonary:     Effort: Pulmonary effort is normal.     Breath sounds: Normal breath sounds.  Musculoskeletal: Normal range of motion.  Skin:    General: Skin is warm and dry.  Neurological:     Mental Status: She is alert and oriented to person, place, and time.  Psychiatric:        Behavior: Behavior normal.   RECENT LABS AND TESTS: BMET    Component Value Date/Time   NA 140 04/19/2018 0924   K 4.4 04/19/2018 0924   CL 104 04/19/2018 0924   CO2  20 04/19/2018 0924   GLUCOSE 93 04/19/2018 0924   GLUCOSE 84 03/03/2014 1208   BUN 13 04/19/2018 0924   CREATININE 0.65 04/19/2018 0924   CALCIUM 9.5 04/19/2018 0924   GFRNONAA 120 04/19/2018 0924   GFRAA 138 04/19/2018 0924   Lab Results  Component Value Date   HGBA1C 4.9 04/19/2018   HGBA1C 4.7 (L) 12/19/2017   HGBA1C 5.1 07/26/2017   Lab Results  Component Value Date   INSULIN 21.8 04/19/2018   INSULIN 18.0 12/19/2017   INSULIN 14.6 07/26/2017   CBC    Component Value Date/Time   WBC 8.2 06/02/2017 0945   RBC 4.78 06/02/2017 0945   HGB 14.5 06/02/2017 0945   HCT 44.3 06/02/2017 0945   PLT 323 06/02/2017 0945   MCV 93 06/02/2017 0945   MCH 30.3 06/02/2017 0945   MCHC 32.7 06/02/2017 0945   RDW 12.3 06/02/2017 0945   LYMPHSABS 2.4 06/02/2017 0945   EOSABS 0.2 06/02/2017 0945   BASOSABS 0.0 06/02/2017 0945   Iron/TIBC/Ferritin/ %Sat No results found for: IRON, TIBC, FERRITIN, IRONPCTSAT Lipid Panel     Component Value Date/Time   CHOL 225 (H) 04/19/2018 0924   TRIG 117 04/19/2018 0924   HDL 50 04/19/2018 0924   CHOLHDL 5.0 (H) 06/02/2017 0945   CHOLHDL  5.1 03/03/2014 1208   VLDL 41 (H) 03/03/2014 1208   LDLCALC 152 (H) 04/19/2018 0924   Hepatic Function Panel     Component Value Date/Time   PROT 7.0 04/19/2018 0924   ALBUMIN 4.4 04/19/2018 0924   AST 13 04/19/2018 0924   ALT 16 04/19/2018 0924   ALKPHOS 71 04/19/2018 0924   BILITOT 0.3 04/19/2018 0924   BILIDIR 0.12 06/02/2017 0945      Component Value Date/Time   TSH 1.850 07/26/2017 1127   Results for MALYSSA, MONGILLO (MRN [id number]) as of 07/12/2018 12:02  Ref. Range 04/19/2018 09:24  Vitamin D, 25-Hydroxy Latest Ref Range: 30.0 - 100.0 ng/mL 35.4   OBESITY BEHAVIORAL INTERVENTION VISIT  Today's visit was #13  Starting weight: 199 lbs Starting date: 07/26/2017 Today's weight: 186 lbs  Today's date: 07/12/2018 Total lbs lost to date: 13    07/12/2018  Height 5\' 3"  (1.6 m)  Weight 186 lb (84.4 kg)  BMI (Calculated) 32.96  BLOOD PRESSURE - SYSTOLIC 130  BLOOD PRESSURE - DIASTOLIC 84   Body Fat % 40.9 %  Total Body Water (lbs) 73.4 lbs   ASK: We discussed the diagnosis of obesity with Arva Chafe today and Danixa agreed to give [name] permission to discuss obesity behavioral modification therapy today.  ASSESS: Breeana has the diagnosis of obesity and her BMI today is 32.96. Cesily is in the action stage of change.   ADVISE: Hunter was educated on the multiple health risks of obesity as well as the benefit of weight loss to improve her health. She was advised of the need for long term treatment and the importance of lifestyle modifications to improve her current health and to decrease her risk of future health problems.  AGREE: Multiple dietary modification options and treatment options were discussed and  Ivee agreed to follow the recommendations documented in the above note.  ARRANGE: Erilynn was educated on the importance of frequent visits to treat obesity as outlined per CMS and USPSTF guidelines and agreed to schedule her next follow up appointment today.  Fernanda Drum, am acting as transcriptionist for [name], PA-C I, [name], PA-C have reviewed above note and  agree with its content

## 2018-07-18 MED FILL — NORETHINDRONE 0.35 MG TAB: 0.35 | 84 days supply | Qty: 84 | Fill #1

## 2018-07-25 ENCOUNTER — Encounter (INDEPENDENT_AMBULATORY_CARE_PROVIDER_SITE_OTHER): Payer: Self-pay

## 2018-08-02 ENCOUNTER — Ambulatory Visit (INDEPENDENT_AMBULATORY_CARE_PROVIDER_SITE_OTHER): Payer: No Typology Code available for payment source | Admitting: Physician Assistant

## 2018-08-02 ENCOUNTER — Other Ambulatory Visit: Payer: Self-pay

## 2018-08-02 ENCOUNTER — Encounter (INDEPENDENT_AMBULATORY_CARE_PROVIDER_SITE_OTHER): Payer: Self-pay | Admitting: Physician Assistant

## 2018-08-02 DIAGNOSIS — Z6832 Body mass index (BMI) 32.0-32.9, adult: Secondary | ICD-10-CM

## 2018-08-02 DIAGNOSIS — E7849 Other hyperlipidemia: Secondary | ICD-10-CM | POA: Diagnosis not present

## 2018-08-02 DIAGNOSIS — E559 Vitamin D deficiency, unspecified: Secondary | ICD-10-CM | POA: Diagnosis not present

## 2018-08-02 DIAGNOSIS — E669 Obesity, unspecified: Secondary | ICD-10-CM

## 2018-08-02 MED ORDER — VITAMIN D (ERGOCALCIFEROL) 1.25 MG (50000 UNIT) PO CAPS: 50000.0000 [IU] | ORAL_CAPSULE | ORAL | 0 refills | Status: DC

## 2018-08-02 MED FILL — VIT D2 1.25 MG (50,000 UNIT: 1.25 MG | 28 days supply | Qty: 4 | Fill #0

## 2018-08-03 ENCOUNTER — Ambulatory Visit (INDEPENDENT_AMBULATORY_CARE_PROVIDER_SITE_OTHER): Payer: No Typology Code available for payment source | Admitting: Family Medicine

## 2018-08-03 ENCOUNTER — Encounter: Payer: Self-pay | Admitting: Family Medicine

## 2018-08-03 VITALS — BP 120/84 | HR 71 | Temp 98.0°F | Ht 63.0 in

## 2018-08-03 DIAGNOSIS — F321 Major depressive disorder, single episode, moderate: Secondary | ICD-10-CM | POA: Diagnosis not present

## 2018-08-03 DIAGNOSIS — F411 Generalized anxiety disorder: Secondary | ICD-10-CM

## 2018-08-03 NOTE — Progress Notes (Signed)
Chief Complaint  Patient presents with  . Anxiety    anxiety consult.    HPI: Alison Davies is a 33 y.o. female here to discuss concerns about anxiety/depression. At last OV with pt in 05/2018, we increased her zoloft from  to  daily.  On 05/11/18, PHQ-9 = 7 and GAD-7 = 6.  Today pt states she had been doing well for 1.5 mo on  zoloft, was sleeping ok, anxiety was controlled, depression also controlled. However, the past 3-4 wks pt notes significant increase in anxiety and depression with the COVID-19 pandemic. Pt endorses constant worrying - about her health, health of family, financial stress. She feels isolated since she is not able to see family, friends, and interactions at work are limited. She is not eating well, not sleeping well with difficulty falling asleep. 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 She notes a crying episode while at work just a short time ago. She states she has a fear of the unknown and like she has not control over the situation. She is worried about supply and availability of PPE. She states she wants to do her hospital shift(s) but is just anxious about the COVID-19 pandemic.  No SI.  PHQ-9 = 17 (moderately severe) GAD-7 = 21 (severe)   Depression screen [facility] 2/9 01/10/2018 12/20/2017 12/06/2017  Decreased Interest 0 0 1  Down, Depressed, Hopeless 0 0 1  PHQ - 2 Score 0 0 2  Altered sleeping Tired, decreased energy Change in appetite 0 0 0  Feeling bad or failure about yourself  0 0 0  Trouble concentrating 0 0 0  Moving slowly or fidgety/restless 0 0 0  Suicidal thoughts 0 0 -  PHQ-9 Score Difficult doing work/chores - - -  Some encounter information is confidential and restricted. Go to Review Flowsheets activity to see all data.  Some recent data might be hidden    GAD 7 : Generalized Anxiety Score 01/10/2018  12/20/2017 12/06/2017 11/23/2017  Nervous, Anxious, on Edge 0 1 0 1  Control/stop worrying 0 0 0 0  Worry too much - different things 0 1 1 0  Trouble relaxing 0 0 1 0  Restless 0 Easily annoyed or irritable 0 1 0 2  Afraid - awful might happen 0 0 0 0  Total GAD 7 Score 0 Anxiety Difficulty Not difficult at all Somewhat difficult Somewhat difficult Somewhat difficult  Some encounter information is confidential and restricted. Go to Review Flowsheets activity to see all data.      Past Medical History:  Diagnosis Date  . Anxiety   . Hyperlipidemia   . Leg edema   . Migraines     No past surgical history on file.  Social History   Socioeconomic History  . Marital status: Single    Spouse name: Not on file  . Number of children: Not on file  . Years of education: Not on file  . Highest education level: Not on file  Occupational History  . Occupation: [employer]: [employer]  Social Needs  . Financial resource strain: Not on file  . Food insecurity:    Worry: Not on file    Inability: Not on file  . Transportation needs:    Medical:  Not on file    Non-medical: Not on file  Tobacco Use  . Smoking status: Never Smoker  . Smokeless tobacco: Never Used  Substance and Sexual Activity  . Alcohol use: Yes    Comment: occasional  . Drug use: Never  . Sexual activity: Not on file  Lifestyle  . Physical activity:    Days per week: Not on file    Minutes per session: Not on file  . Stress: Not on file  Relationships  . Social connections:    Talks on phone: Not on file    Gets together: Not on file    Attends religious service: Not on file    Active member of club or organization: Not on file    Attends meetings of clubs or organizations: Not on file    Relationship status: Not on file  . Intimate partner violence:    Fear of current or ex partner: Not on file    Emotionally abused: Not on file    Physically abused: Not on file    Forced  sexual activity: Not on file  Other Topics Concern  . Not on file  Social History Narrative  . Not on file    Family History  Problem Relation Age of Onset  . Anxiety disorder Mother   . Diabetes Father   . Obesity Father      Immunization History  Administered Date(s) Administered  . Influenza-Unspecified 01/17/2014  . PPD Test 02/24/2014, 03/06/2014, 03/27/2015, 03/28/2016  . Tdap 02/17/2014, 03/04/2018    Outpatient Encounter Medications as of 08/03/2018  Medication Sig  . ALPRAZolam (XANAX) 0.25 MG tablet Take 1 tablet (0.25 mg total) by mouth at bedtime as needed for anxiety.  . Biotin 3 MG TABS Take 2 tablets by mouth daily.  . Cyanocobalamin (VITAMIN B-12 CR) 1500 MCG TBCR Take 2 tablets by mouth 2 (two) times daily.  . Melatonin 5 MG CHEW Chew 2 capsules by mouth at bedtime as needed.  . Multiple Vitamin (MULTIVITAMIN WITH MINERALS) TABS tablet Take 1 tablet by mouth daily.  . norethindrone (MICRONOR,CAMILA,ERRIN) 0.35 MG tablet Take 1 tablet (0.35 mg total) by mouth daily.  . sertraline (ZOLOFT) 100 MG tablet Take 1 tablet (100 mg total) by mouth daily.  . SUMAtriptan (IMITREX) 25 MG tablet Take 1 tablet (25 mg total) by mouth every 2 (two) hours as needed for migraine. May repeat in 2 hours if headache persists or recurs.  . topiramate (TOPAMAX) 100 MG tablet Take 1 tablet (100 mg total) by mouth at bedtime.  . vitamin C (ASCORBIC ACID) 250 MG tablet Take 250 mg by mouth daily.  . Vitamin D, Ergocalciferol, (DRISDOL) 1.25 MG (50000 UT) CAPS capsule Take 1 capsule (50,000 Units total) by mouth every 7 (seven) days.   No facility-administered encounter medications on file as of 08/03/2018.      ROS: Gen: no fever, chills  Skin: no rash, itching Resp: no cough, wheeze,SOB CV: no CP, palpitations, LE edema,  GI: no heartburn, + nausea, no v/d/c, abd pain GU: no dysuria, urgency, frequency, hematuria  MSK: no joint pain, myalgias, back pain Neuro: no dizziness,  headache, weakness, vertigo Psych: as above in HPI   No Known Allergies  BP 120/84   Pulse 71   Temp 98 F (36.7 C) (Oral)   Ht 5\' 3"  (1.6 m)   SpO2 98%   BMI 32.95 kg/m   Physical Exam  Constitutional: She is oriented to person, place, and time. She appears well-developed  and well-nourished.  Pt appears anxious and is crying at times   Cardiovascular: Normal rate and regular rhythm.  Pulmonary/Chest: Effort normal and breath sounds normal. No respiratory distress.  Neurological: She is alert and oriented to person, place, and time.  Psychiatric: Her behavior is normal.     A/P: 1. GAD (generalized anxiety disorder) 2. Depression, major, single episode, moderate (HCC) - PHQ-9 = 17 (moderately severe) and GAD-7 = 21 (severe) - reviewed with pt deep breathing and relaxation techniques to utilize when she starts to feel increased anxiety - pt plans to journal to help relieve her anxiety - discussed importance of fresh air and exercise on mental health - pt would like to increase zoloft from 100mg  to 150mg  daily and I am agreeable to this - pt has Rx for xanax 0.25mg  which she has not taken. I explained that I think taking 1/2 tab at bedtime tonight may be helpful in allowing her to get a good/restful night's sleep which in turn may help lessen her anxiety. She will consider this - f/u in 3-4 wks or sooner PRN Discussed plan and reviewed medications with patient, including risks, benefits, and potential side effects. Pt expressed understand. All questions answered.

## 2018-08-06 ENCOUNTER — Other Ambulatory Visit: Payer: Self-pay | Admitting: Family Medicine

## 2018-08-06 DIAGNOSIS — F411 Generalized anxiety disorder: Secondary | ICD-10-CM

## 2018-08-06 MED FILL — SERTRALINE HCL 100 MG TAB: 100 | 90 days supply | Qty: 90 | Fill #0

## 2018-08-06 NOTE — Progress Notes (Signed)
Office: [phone]  /  Fax: [phone] TeleHealth Visit:  Alison Davies has verbally consented to this TeleHealth visit today. The patient is located at work, the provider is located at the [facility] office. The participants in this visit include the listed provider and patient. The visit was conducted today via FaceTime.  HPI:   Chief Complaint: OBESITY Alison Davies is here to discuss her progress with her obesity treatment plan. She is keeping a food journal with 1500 calories and 95 grams of protein and is following her eating plan approximately 0% of the time. She states she is exercising 0 minutes 0 times per week. Klyn reports that she is struggling with social isolation with the current pandemic. She states she has only been eating 1 meal daily. We were unable to weigh the patient today for this TeleHealth visit. She feels as if she has gained weight since her last visit. She has lost 13 lbs since starting treatment with [name].  Vitamin D deficiency Alison Davies has a diagnosis of Vitamin D deficiency. She is currently taking prescription Vit D and denies nausea, vomiting or muscle weakness.  Hyperlipidemia Alison Davies has hyperlipidemia and has been trying to improve her cholesterol levels with intensive lifestyle modification including a low saturated fat diet, exercise and weight loss. She denies taking any medication and denies chest pain.  ASSESSMENT AND PLAN:  Vitamin D deficiency - Plan: Vitamin D, Ergocalciferol, (DRISDOL) 1.25 MG (50000 UT) CAPS capsule  Other hyperlipidemia  Class 1 obesity with serious comorbidity and body mass index (BMI) of 32.0 to 32.9 in adult, unspecified obesity type  PLAN:  Vitamin D Deficiency Alison Davies was informed that low Vitamin D levels contributes to fatigue and are associated with obesity, breast, and colon cancer. She agrees to continue to take prescription Vit D @ 50,000 IU every week #4 with 0 refills and will follow-up for routine  testing of Vitamin D, at least 2-3 times per year. She was informed of the risk of over-replacement of Vitamin D and agrees to not increase her dose unless she discusses this with [name] first. Alison Davies agrees to follow-up with our clinic in 3 weeks.  Hyperlipidemia Alison Davies was informed of the American Heart Association Guidelines emphasizing intensive lifestyle modifications as the first line treatment for hyperlipidemia. We discussed many lifestyle modifications today in depth, and Alison Davies will continue to work on decreasing saturated fats such as fatty red meat, butter and many fried foods. She will also increase vegetables and lean protein in her diet and continue to work on exercise and weight loss efforts.  Obesity Alison Davies is currently in the action stage of change. As such, her goal is to continue with weight loss efforts. She has agreed to keep a food journal with 1500 calories and 95 grams of protein. Alison Davies has been instructed to work up to a goal of 150 minutes of combined cardio and strengthening exercise per week for weight loss and overall health benefits. We discussed the following Behavioral Modification Strategies today: increasing lean protein intake and no skipping meals.  Alison Davies has agreed to follow-up with our clinic in 3 weeks. She was informed of the importance of frequent follow-up visits to maximize her success with intensive lifestyle modifications for her multiple health conditions.  ALLERGIES: No Known Allergies  MEDICATIONS: Current Outpatient Medications on File Prior to Visit  Medication Sig Dispense Refill  . ALPRAZolam (XANAX) 0.25 MG tablet Take 1 tablet (0.25 mg total) by mouth at bedtime as needed for anxiety.  30 tablet 0  . Biotin 3 MG TABS Take 2 tablets by mouth daily.    . Cyanocobalamin (VITAMIN B-12 CR) 1500 MCG TBCR Take 2 tablets by mouth 2 (two) times daily.    . Melatonin 5 MG CHEW Chew 2 capsules by mouth at bedtime as needed.    . Multiple Vitamin  (MULTIVITAMIN WITH MINERALS) TABS tablet Take 1 tablet by mouth daily.    . norethindrone (MICRONOR,CAMILA,ERRIN) 0.35 MG tablet Take 1 tablet (0.35 mg total) by mouth daily. 3 Package 3  . sertraline (ZOLOFT) 100 MG tablet Take 1 tablet (100 mg total) by mouth daily. 90 tablet 3  . SUMAtriptan (IMITREX) 25 MG tablet Take 1 tablet (25 mg total) by mouth every 2 (two) hours as needed for migraine. May repeat in 2 hours if headache persists or recurs. 10 tablet 2  . topiramate (TOPAMAX) 100 MG tablet Take 1 tablet (100 mg total) by mouth at bedtime. 90 tablet 3  . vitamin C (ASCORBIC ACID) 250 MG tablet Take 250 mg by mouth daily.     No current facility-administered medications on file prior to visit.     PAST MEDICAL HISTORY: Past Medical History:  Diagnosis Date  . Anxiety   . Hyperlipidemia   . Leg edema   . Migraines     PAST SURGICAL HISTORY: History reviewed. No pertinent surgical history.  SOCIAL HISTORY: Social History   Tobacco Use  . Smoking status: Never Smoker  . Smokeless tobacco: Never Used  Substance Use Topics  . Alcohol use: Yes    Comment: occasional  . Drug use: Never    FAMILY HISTORY: Family History  Problem Relation Age of Onset  . Anxiety disorder Mother   . Diabetes Father   . Obesity Father    ROS: Review of Systems  Cardiovascular: Negative for chest pain.  Gastrointestinal: Negative for nausea and vomiting.  Musculoskeletal:       Negative for muscle weakness.   PHYSICAL EXAM: Pt in no acute distress  RECENT LABS AND TESTS: BMET    Component Value Date/Time   NA 140 04/19/2018 0924   K 4.4 04/19/2018 0924   CL 104 04/19/2018 0924   CO2 20 04/19/2018 0924   GLUCOSE 93 04/19/2018 0924   GLUCOSE 84 03/03/2014 1208   BUN 13 04/19/2018 0924   CREATININE 0.65 04/19/2018 0924   CALCIUM 9.5 04/19/2018 0924   GFRNONAA 120 04/19/2018 0924   GFRAA 138 04/19/2018 0924   Lab Results  Component Value Date   HGBA1C 4.9 04/19/2018    HGBA1C 4.7 (L) 12/19/2017   HGBA1C 5.1 07/26/2017   Lab Results  Component Value Date   INSULIN 21.8 04/19/2018   INSULIN 18.0 12/19/2017   INSULIN 14.6 07/26/2017   CBC    Component Value Date/Time   WBC 8.2 06/02/2017 0945   RBC 4.78 06/02/2017 0945   HGB 14.5 06/02/2017 0945   HCT 44.3 06/02/2017 0945   PLT 323 06/02/2017 0945   MCV 93 06/02/2017 0945   MCH 30.3 06/02/2017 0945   MCHC 32.7 06/02/2017 0945   RDW 12.3 06/02/2017 0945   LYMPHSABS 2.4 06/02/2017 0945   EOSABS 0.2 06/02/2017 0945   BASOSABS 0.0 06/02/2017 0945   Iron/TIBC/Ferritin/ %Sat No results found for: IRON, TIBC, FERRITIN, IRONPCTSAT Lipid Panel     Component Value Date/Time   CHOL 225 (H) 04/19/2018 0924   TRIG 117 04/19/2018 0924   HDL 50 04/19/2018 0924   CHOLHDL 5.0 (H) 06/02/2017 0945  CHOLHDL 5.1 03/03/2014 1208   VLDL 41 (H) 03/03/2014 1208   LDLCALC 152 (H) 04/19/2018 0924   Hepatic Function Panel     Component Value Date/Time   PROT 7.0 04/19/2018 0924   ALBUMIN 4.4 04/19/2018 0924   AST 13 04/19/2018 0924   ALT 16 04/19/2018 0924   ALKPHOS 71 04/19/2018 0924   BILITOT 0.3 04/19/2018 0924   BILIDIR 0.12 06/02/2017 0945      Component Value Date/Time   TSH 1.850 07/26/2017 1127   Results for MEHA, GOON (MRN [id number]) as of 08/06/2018 08:30  Ref. Range 04/19/2018 09:24  Vitamin D, 25-Hydroxy Latest Ref Range: 30.0 - 100.0 ng/mL 35.4    I, Marianna Payment, am acting as Energy manager for [employer], PA-C I, [name], PA-C have reviewed above note and agree with its content

## 2018-08-06 NOTE — Telephone Encounter (Signed)
Dr. Salena Saner please advise last refilled 05/11/2018 #30 no refills PRN

## 2018-08-07 ENCOUNTER — Encounter: Payer: Self-pay | Admitting: Family Medicine

## 2018-08-22 ENCOUNTER — Encounter (INDEPENDENT_AMBULATORY_CARE_PROVIDER_SITE_OTHER): Payer: Self-pay | Admitting: Physician Assistant

## 2018-08-22 ENCOUNTER — Other Ambulatory Visit: Payer: Self-pay

## 2018-08-22 ENCOUNTER — Ambulatory Visit (INDEPENDENT_AMBULATORY_CARE_PROVIDER_SITE_OTHER): Payer: No Typology Code available for payment source | Admitting: Physician Assistant

## 2018-08-22 DIAGNOSIS — E559 Vitamin D deficiency, unspecified: Secondary | ICD-10-CM | POA: Diagnosis not present

## 2018-08-22 DIAGNOSIS — E8881 Metabolic syndrome: Secondary | ICD-10-CM | POA: Diagnosis not present

## 2018-08-22 DIAGNOSIS — E7849 Other hyperlipidemia: Secondary | ICD-10-CM

## 2018-08-22 DIAGNOSIS — E669 Obesity, unspecified: Secondary | ICD-10-CM

## 2018-08-22 DIAGNOSIS — Z6833 Body mass index (BMI) 33.0-33.9, adult: Secondary | ICD-10-CM

## 2018-08-22 MED ORDER — VITAMIN D (ERGOCALCIFEROL) 1.25 MG (50000 UNIT) PO CAPS: 50000.0000 [IU] | ORAL_CAPSULE | ORAL | 0 refills | Status: DC

## 2018-08-22 MED FILL — VIT D2 1.25 MG (50,000 UNIT: 1.25 MG | 28 days supply | Qty: 4 | Fill #0

## 2018-08-22 NOTE — Progress Notes (Signed)
Office: [phone]  /  Fax: [phone] TeleHealth Visit:  Alison Davies has verbally consented to this TeleHealth visit today. The patient is located at work, the provider is located at the [facility] office. The participants in this visit include the listed provider and patient. The visit was conducted today via Face Time.  HPI:   Chief Complaint: OBESITY Alison Davies is here to discuss her progress with her obesity treatment plan. She is keeping a food journal with 1500 calories and 95 grams of protein and is following her eating plan approximately 0 % of the time. She states she is exercising 0 minutes 0 times per week. Alison Davies reports that she has been depressed and is not eating on her plan. She saw her PCP recently, who increased her Zoloft. She is ready to restart.  We were unable to weigh the patient today for this TeleHealth visit. She feels as if she has gained weight since her last visit. She has lost 13 lbs since starting treatment with us.  Vitamin D deficiency Alison Davies has a diagnosis of vitamin D deficiency. She is currently taking vit D and denies nausea, vomiting, or muscle weakness.  Hyperlipidemia Alison Davies has hyperlipidemia and is not on medications. She has been trying to improve her cholesterol levels with intensive lifestyle modification including a low saturated fat diet, exercise, and weight loss. She denies any chest pain.  Insulin Resistance Alison Davies has a diagnosis of insulin resistance based on her elevated fasting insulin level >5. Although Alison Davies's blood glucose readings are still under good control, insulin resistance puts her at greater risk of metabolic syndrome and diabetes. She is not taking medications currently and continues to work on diet and exercise to decrease risk of diabetes. Alison Davies denies polyphagia.  ASSESSMENT AND PLAN:  Vitamin D deficiency - Plan: Vitamin D, Ergocalciferol, (DRISDOL) 1.25 MG (50000 UT) CAPS capsule  Other hyperlipidemia  Insulin resistance  Class 1 obesity with serious comorbidity and body mass index (BMI) of 33.0 to 33.9 in adult, unspecified obesity type  PLAN:  Vitamin D Deficiency Alison Davies was informed that low vitamin D levels contribute to fatigue and are associated with obesity, breast, and colon cancer. Alison Davies agrees to continue to take prescription Vit D @50 ,000 IU every week #4 with no refills and will follow up for routine testing of vitamin D, at least 2-3 times per year. She was informed of the risk of over-replacement of vitamin D and agrees to not increase her dose unless she discusses this with us first. Alison Davies agrees to follow up in 3 weeks as directed.  Hyperlipidemia Alison Davies was informed of the American Heart Association Guidelines emphasizing intensive lifestyle modifications as the first line treatment for hyperlipidemia. We discussed many lifestyle modifications today in depth, and Alison Davies will continue to work on decreasing saturated fats such as fatty red meat, butter and many fried foods. She will also increase vegetables and lean protein in her diet and continue to work on exercise and weight loss efforts. Alison Davies agrees to continue with weight loss and follow up in 3 weeks as directed.  Insulin Resistance Alison Davies will continue to work on weight loss, exercise, and decreasing simple carbohydrates in her diet to help decrease the risk of diabetes. She was informed that eating too many simple carbohydrates or too many calories at one sitting increases the likelihood of GI side effects. Alison Davies agreed to continue with weight loss and follow up with us as directed to monitor her progress.   Obesity Alison Davies  is currently in the action stage of change. As such, her goal is to continue with weight loss efforts. She has agreed to change to follow the Category 3 plan. Alison Davies has been instructed to work up to a goal of 150 minutes of combined cardio and strengthening exercise per week for weight loss and overall  health benefits. We discussed the following Behavioral Modification Strategies today: work on meal planning and easy cooking plans and keeping healthy foods in the home.  Alison Davies has agreed to follow up with our clinic in 3 weeks. She was informed of the importance of frequent follow up visits to maximize her success with intensive lifestyle modifications for her multiple health conditions.  ALLERGIES: No Known Allergies  MEDICATIONS: Current Outpatient Medications on File Prior to Visit  Medication Sig Dispense Refill  . ALPRAZolam (XANAX) 0.25 MG tablet Take 1 tablet (0.25 mg total) by mouth at bedtime as needed for anxiety. 30 tablet 0  . Biotin 3 MG TABS Take 2 tablets by mouth daily.    . Cyanocobalamin (VITAMIN B-12 CR) 1500 MCG TBCR Take 2 tablets by mouth 2 (two) times daily.    . Melatonin 5 MG CHEW Chew 2 capsules by mouth at bedtime as needed.    . Multiple Vitamin (MULTIVITAMIN WITH MINERALS) TABS tablet Take 1 tablet by mouth daily.    . norethindrone (MICRONOR,CAMILA,ERRIN) 0.35 MG tablet Take 1 tablet (0.35 mg total) by mouth daily. 3 Package 3  . sertraline (ZOLOFT) 100 MG tablet Take 1 tablet (100 mg total) by mouth daily. 90 tablet 3  . SUMAtriptan (IMITREX) 25 MG tablet Take 1 tablet (25 mg total) by mouth every 2 (two) hours as needed for migraine. May repeat in 2 hours if headache persists or recurs. 10 tablet 2  . topiramate (TOPAMAX) 100 MG tablet Take 1 tablet (100 mg total) by mouth at bedtime. 90 tablet 3  . vitamin C (ASCORBIC ACID) 250 MG tablet Take 250 mg by mouth daily.     No current facility-administered medications on file prior to visit.     PAST MEDICAL HISTORY: Past Medical History:  Diagnosis Date  . Anxiety   . Hyperlipidemia   . Leg edema   . Migraines     PAST SURGICAL HISTORY: History reviewed. No pertinent surgical history.  SOCIAL HISTORY: Social History   Tobacco Use  . Smoking status: Never Smoker  . Smokeless tobacco: Never Used   Substance Use Topics  . Alcohol use: Yes    Comment: occasional  . Drug use: Never    FAMILY HISTORY: Family History  Problem Relation Age of Onset  . Anxiety disorder Mother   . Diabetes Father   . Obesity Father     ROS: Review of Systems  Cardiovascular: Negative for chest pain.  Gastrointestinal: Negative for nausea and vomiting.  Musculoskeletal:       Negative for muscle weakness.  Endo/Heme/Allergies:       Negative for polyphagia.    PHYSICAL EXAM: Pt in no acute distress  RECENT LABS AND TESTS: BMET    Component Value Date/Time   NA 140 04/19/2018 0924   K 4.4 04/19/2018 0924   CL 104 04/19/2018 0924   CO2 20 04/19/2018 0924   GLUCOSE 93 04/19/2018 0924   GLUCOSE 84 03/03/2014 1208   BUN 13 04/19/2018 0924   CREATININE 0.65 04/19/2018 0924   CALCIUM 9.5 04/19/2018 0924   GFRNONAA 120 04/19/2018 0924   GFRAA 138 04/19/2018 0924   Lab Results  Component Value Date   HGBA1C 4.9 04/19/2018   HGBA1C 4.7 (L) 12/19/2017   HGBA1C 5.1 07/26/2017   Lab Results  Component Value Date   INSULIN 21.8 04/19/2018   INSULIN 18.0 12/19/2017   INSULIN 14.6 07/26/2017   CBC    Component Value Date/Time   WBC 8.2 06/02/2017 0945   RBC 4.78 06/02/2017 0945   HGB 14.5 06/02/2017 0945   HCT 44.3 06/02/2017 0945   PLT 323 06/02/2017 0945   MCV 93 06/02/2017 0945   MCH 30.3 06/02/2017 0945   MCHC 32.7 06/02/2017 0945   RDW 12.3 06/02/2017 0945   LYMPHSABS 2.4 06/02/2017 0945   EOSABS 0.2 06/02/2017 0945   BASOSABS 0.0 06/02/2017 0945   Iron/TIBC/Ferritin/ %Sat No results found for: IRON, TIBC, FERRITIN, IRONPCTSAT Lipid Panel     Component Value Date/Time   CHOL 225 (H) 04/19/2018 0924   TRIG 117 04/19/2018 0924   HDL 50 04/19/2018 0924   CHOLHDL 5.0 (H) 06/02/2017 0945   CHOLHDL 5.1 03/03/2014 1208   VLDL 41 (H) 03/03/2014 1208   LDLCALC 152 (H) 04/19/2018 0924   Hepatic Function Panel     Component Value Date/Time   PROT 7.0 04/19/2018 0924    ALBUMIN 4.4 04/19/2018 0924   AST 13 04/19/2018 0924   ALT 16 04/19/2018 0924   ALKPHOS 71 04/19/2018 0924   BILITOT 0.3 04/19/2018 0924   BILIDIR 0.12 06/02/2017 0945      Component Value Date/Time   TSH 1.850 07/26/2017 1127    Results for FEDERICA, MICHELLI (MRN [id number]) as of 08/22/2018 14:44  Ref. Range 04/19/2018 09:24  Vitamin D, 25-Hydroxy Latest Ref Range: 30.0 - 100.0 ng/mL 35.4    I, Kirke Corin, CMA, am acting as transcriptionist for [name], PA-C I, [name], PA-C have reviewed above note and agree with its content

## 2018-08-23 ENCOUNTER — Encounter: Payer: Self-pay | Admitting: Family Medicine

## 2018-08-24 MED ORDER — TOPIRAMATE 100 MG PO TABS: 100.0000 mg | ORAL_TABLET | Freq: Every day | ORAL | 3 refills | Status: DC

## 2018-08-24 MED FILL — TOPIRAMATE 100 MG TABLET: 100 | 90 days supply | Qty: 90 | Fill #0

## 2018-08-29 ENCOUNTER — Encounter: Payer: Self-pay | Admitting: Family Medicine

## 2018-09-10 ENCOUNTER — Other Ambulatory Visit: Payer: Self-pay

## 2018-09-10 ENCOUNTER — Ambulatory Visit (INDEPENDENT_AMBULATORY_CARE_PROVIDER_SITE_OTHER): Payer: No Typology Code available for payment source | Admitting: Physician Assistant

## 2018-09-10 ENCOUNTER — Encounter (INDEPENDENT_AMBULATORY_CARE_PROVIDER_SITE_OTHER): Payer: Self-pay | Admitting: Physician Assistant

## 2018-09-10 DIAGNOSIS — E669 Obesity, unspecified: Secondary | ICD-10-CM | POA: Diagnosis not present

## 2018-09-10 DIAGNOSIS — Z6832 Body mass index (BMI) 32.0-32.9, adult: Secondary | ICD-10-CM | POA: Diagnosis not present

## 2018-09-10 DIAGNOSIS — E559 Vitamin D deficiency, unspecified: Secondary | ICD-10-CM | POA: Diagnosis not present

## 2018-09-10 NOTE — Progress Notes (Signed)
Office: [phone]  /  Fax: [phone] TeleHealth Visit:  Alison Davies has verbally consented to this TeleHealth visit today. The patient is located at work, the provider is located at the [facility] office. The participants in this visit include the listed provider and patient. The visit was conducted today via FaceTime.  HPI:   Chief Complaint: OBESITY Alison Davies is here to discuss her progress with her obesity treatment plan. She is on the Category 3 plan and is following her eating plan approximately 75% of the time. She states she is exercising 0 minutes 0 times per week. Alison Davies reports that she is doing better overall the past 2 weeks. She is also journaling and meeting her protein goals daily. She is also sticking with her calorie goal daily. We were unable to weigh the patient today for this TeleHealth visit. She feels as if she has lost 3 lbs since her last visit. She has lost 13 lbs since starting treatment with Alison Davies.  Vitamin D deficiency Stephan has a diagnosis of Vitamin D deficiency. She is currently taking Vit D and denies nausea, vomiting or muscle weakness.  ASSESSMENT AND PLAN:  Vitamin D deficiency  Class 1 obesity with serious comorbidity and body mass index (BMI) of 32.0 to 32.9 in adult, unspecified obesity type  PLAN:  Vitamin D Deficiency Keigan was informed that low Vitamin D levels contributes to fatigue and are associated with obesity, breast, and colon cancer. She agrees to continue taking Vit D and will follow-up for routine testing of Vitamin D, at least 2-3 times per year. She was informed of the risk of over-replacement of Vitamin D and agrees to not increase her dose unless she discusses this with Alison Davies first. Alison Davies agrees to follow-up with our clinic in 2 weeks.  Obesity Alison Davies is currently in the action stage of change. As such, her goal is to continue with weight loss efforts. She has agreed to follow the Category 3 plan or journal 1500  calories + 95 grams of protein. Alison Davies has been instructed to work up to a goal of 150 minutes of combined cardio and strengthening exercise per week for weight loss and overall health benefits. We discussed the following Behavioral Modification Strategies today: work on meal planning and easy cooking plans.  Alison Davies has agreed to follow-up with our clinic in 2 weeks. She was informed of the importance of frequent follow-up visits to maximize her success with intensive lifestyle modifications for her multiple health conditions.  ALLERGIES: No Known Allergies  MEDICATIONS: Current Outpatient Medications on File Prior to Visit  Medication Sig Dispense Refill  . ALPRAZolam (XANAX) 0.25 MG tablet Take 1 tablet (0.25 mg total) by mouth at bedtime as needed for anxiety. 30 tablet 0  . Biotin 3 MG TABS Take 2 tablets by mouth daily.    . Cyanocobalamin (VITAMIN B-12 CR) 1500 MCG TBCR Take 2 tablets by mouth 2 (two) times daily.    . Melatonin 5 MG CHEW Chew 2 capsules by mouth at bedtime as needed.    . Multiple Vitamin (MULTIVITAMIN WITH MINERALS) TABS tablet Take 1 tablet by mouth daily.    . norethindrone (MICRONOR,CAMILA,ERRIN) 0.35 MG tablet Take 1 tablet (0.35 mg total) by mouth daily. 3 Package 3  . sertraline (ZOLOFT) 100 MG tablet Take 1 tablet (100 mg total) by mouth daily. 90 tablet 3  . SUMAtriptan (IMITREX) 25 MG tablet Take 1 tablet (25 mg total) by mouth every 2 (two) hours as needed for  migraine. May repeat in 2 hours if headache persists or recurs. 10 tablet 2  . topiramate (TOPAMAX) 100 MG tablet Take 1 tablet (100 mg total) by mouth at bedtime. 90 tablet 3  . vitamin C (ASCORBIC ACID) 250 MG tablet Take 250 mg by mouth daily.    . Vitamin D, Ergocalciferol, (DRISDOL) 1.25 MG (50000 UT) CAPS capsule Take 1 capsule (50,000 Units total) by mouth every 7 (seven) days. 4 capsule 0   No current facility-administered medications on file prior to visit.     PAST MEDICAL HISTORY: Past  Medical History:  Diagnosis Date  . Anxiety   . Hyperlipidemia   . Leg edema   . Migraines     PAST SURGICAL HISTORY: History reviewed. No pertinent surgical history.  SOCIAL HISTORY: Social History   Tobacco Use  . Smoking status: Never Smoker  . Smokeless tobacco: Never Used  Substance Use Topics  . Alcohol use: Yes    Comment: occasional  . Drug use: Never    FAMILY HISTORY: Family History  Problem Relation Age of Onset  . Anxiety disorder Mother   . Diabetes Father   . Obesity Father    ROS: Review of Systems  Gastrointestinal: Negative for nausea and vomiting.  Musculoskeletal:       Negative for muscle weakness.   PHYSICAL EXAM: Pt in no acute distress  RECENT LABS AND TESTS: BMET    Component Value Date/Time   NA 140 04/19/2018 0924   K 4.4 04/19/2018 0924   CL 104 04/19/2018 0924   CO2 20 04/19/2018 0924   GLUCOSE 93 04/19/2018 0924   GLUCOSE 84 03/03/2014 1208   BUN 13 04/19/2018 0924   CREATININE 0.65 04/19/2018 0924   CALCIUM 9.5 04/19/2018 0924   GFRNONAA 120 04/19/2018 0924   GFRAA 138 04/19/2018 0924   Lab Results  Component Value Date   HGBA1C 4.9 04/19/2018   HGBA1C 4.7 (L) 12/19/2017   HGBA1C 5.1 07/26/2017   Lab Results  Component Value Date   INSULIN 21.8 04/19/2018   INSULIN 18.0 12/19/2017   INSULIN 14.6 07/26/2017   CBC    Component Value Date/Time   WBC 8.2 06/02/2017 0945   RBC 4.78 06/02/2017 0945   HGB 14.5 06/02/2017 0945   HCT 44.3 06/02/2017 0945   PLT 323 06/02/2017 0945   MCV 93 06/02/2017 0945   MCH 30.3 06/02/2017 0945   MCHC 32.7 06/02/2017 0945   RDW 12.3 06/02/2017 0945   LYMPHSABS 2.4 06/02/2017 0945   EOSABS 0.2 06/02/2017 0945   BASOSABS 0.0 06/02/2017 0945   Iron/TIBC/Ferritin/ %Sat No results found for: IRON, TIBC, FERRITIN, IRONPCTSAT Lipid Panel     Component Value Date/Time   CHOL 225 (H) 04/19/2018 0924   TRIG 117 04/19/2018 0924   HDL 50 04/19/2018 0924   CHOLHDL 5.0 (H) 06/02/2017  0945   CHOLHDL 5.1 03/03/2014 1208   VLDL 41 (H) 03/03/2014 1208   LDLCALC 152 (H) 04/19/2018 0924   Hepatic Function Panel     Component Value Date/Time   PROT 7.0 04/19/2018 0924   ALBUMIN 4.4 04/19/2018 0924   AST 13 04/19/2018 0924   ALT 16 04/19/2018 0924   ALKPHOS 71 04/19/2018 0924   BILITOT 0.3 04/19/2018 0924   BILIDIR 0.12 06/02/2017 0945      Component Value Date/Time   TSH 1.850 07/26/2017 1127   Results for Arva ChafeGARCIA, Garima M (MRN [id number]) as of 09/10/2018 15:42  Ref. Range 04/19/2018 09:24  Vitamin D, 25-Hydroxy Latest  Ref Range: 30.0 - 100.0 ng/mL 35.4   I, Marianna Payment, am acting as Energy manager for [employer], PA-C I, [name], PA-C have reviewed above note and agree with its content

## 2018-09-17 MED FILL — VIT D2 1.25 MG (50,000 UNIT: 1.25 MG | 4 days supply | Qty: 4 | Fill #0

## 2018-09-25 ENCOUNTER — Encounter (INDEPENDENT_AMBULATORY_CARE_PROVIDER_SITE_OTHER): Payer: Self-pay | Admitting: Physician Assistant

## 2018-09-26 ENCOUNTER — Other Ambulatory Visit: Payer: Self-pay

## 2018-09-26 ENCOUNTER — Ambulatory Visit (INDEPENDENT_AMBULATORY_CARE_PROVIDER_SITE_OTHER): Payer: No Typology Code available for payment source | Admitting: Physician Assistant

## 2018-09-26 ENCOUNTER — Encounter (INDEPENDENT_AMBULATORY_CARE_PROVIDER_SITE_OTHER): Payer: Self-pay

## 2018-09-26 DIAGNOSIS — E669 Obesity, unspecified: Secondary | ICD-10-CM

## 2018-09-26 DIAGNOSIS — Z6832 Body mass index (BMI) 32.0-32.9, adult: Secondary | ICD-10-CM

## 2018-09-26 DIAGNOSIS — E7849 Other hyperlipidemia: Secondary | ICD-10-CM

## 2018-09-26 DIAGNOSIS — E559 Vitamin D deficiency, unspecified: Secondary | ICD-10-CM

## 2018-09-26 MED ORDER — VITAMIN D (ERGOCALCIFEROL) 1.25 MG (50000 UNIT) PO CAPS: 50000.0000 [IU] | ORAL_CAPSULE | ORAL | 0 refills | Status: DC

## 2018-09-26 MED FILL — VIT D2 1.25 MG (50,000 UNIT: 1.25 MG | 28 days supply | Qty: 4 | Fill #0

## 2018-09-26 NOTE — Progress Notes (Signed)
Office: [phone]  /  Fax: [phone] TeleHealth Visit:  Alison Davies has verbally consented to this TeleHealth visit today. The patient is located at home, the provider is located at the [facility] office. The participants in this visit include the listed provider and patient. The visit was conducted today via face time.  HPI:   Chief Complaint: OBESITY Alison Davies is here to discuss her progress with her obesity treatment plan. She is on the keep a food journal with 1500 calories and 95 grams of protein daily or follow the Category 3 plan and is following her eating plan approximately 85 % of the time. She states she is walking 5,000 steps 2 times per week. Alison Davies reports her weight today to be 184 lbs. She states she did really well the first week and ate off the plan that last week. She is working 3rd shift on the weekend and she is questioning how to journal those days.  We were unable to weigh the patient today for this TeleHealth visit. She feels as if she has maintained her weight since her last visit. She has lost 13 lbs since starting treatment with [name].  Vitamin D Deficiency Alison Davies has a diagnosis of vitamin D deficiency. She is currently taking prescription Vit D and denies nausea, vomiting or muscle weakness.  Hyperlipidemia Alison Davies has hyperlipidemia and has been trying to improve her cholesterol levels with intensive lifestyle modification including a low saturated fat diet, exercise and weight loss. She is not on any medications and denies any chest pain, claudication or myalgias.  ASSESSMENT AND PLAN:  Vitamin D deficiency - Plan: Vitamin D, Ergocalciferol, (DRISDOL) 1.25 MG (50000 UT) CAPS capsule  Other hyperlipidemia  Class 1 obesity with serious comorbidity and body mass index (BMI) of 32.0 to 32.9 in adult, unspecified obesity type  PLAN:  Vitamin D Deficiency Alison Davies was informed that low vitamin D levels contributes to fatigue and are associated with  obesity, breast, and colon cancer. Disa agrees to continue taking prescription Vit D ,000 IU every week #4 and we will refill for 1 month. She will follow up for routine testing of vitamin D, at least 2-3 times per year. She was informed of the risk of over-replacement of vitamin D and agrees to not increase her dose unless she discusses this with [name] first. Alison Davies agrees to follow up with our clinic in 2 weeks.  Hyperlipidemia Alison Davies was informed of the American Heart Association Guidelines emphasizing intensive lifestyle modifications as the first line treatment for hyperlipidemia. We discussed many lifestyle modifications today in depth, and Alison Davies will continue to work on decreasing saturated fats such as fatty red meat, butter and many fried foods. She will also increase vegetables and lean protein in her diet and continue to work on exercise and weight loss efforts. Komal agrees to follow up with our clinic in 2 weeks.  Obesity Alison Davies is currently in the action stage of change. As such, her goal is to continue with weight loss efforts She has agreed to keep a food journal with 1500 calories and 95 grams of protein daily Alison Davies has been instructed to work up to a goal of 150 minutes of combined cardio and strengthening exercise per week for weight loss and overall health benefits. We discussed the following Behavioral Modification Strategies today: work on meal planning and easy cooking plans and planning for success   Alison Davies has agreed to follow up with our clinic in 2 weeks. She was informed of  the importance of frequent follow up visits to maximize her success with intensive lifestyle modifications for her multiple health conditions.  ALLERGIES: No Known Allergies  MEDICATIONS: Current Outpatient Medications on File Prior to Visit  Medication Sig Dispense Refill  . ALPRAZolam (XANAX) 0.25 MG tablet Take 1 tablet (0.25 mg total) by mouth at bedtime as needed for anxiety. 30 tablet 0  .  Biotin 3 MG TABS Take 2 tablets by mouth daily.    . Cyanocobalamin (VITAMIN B-12 CR) 1500 MCG TBCR Take 2 tablets by mouth 2 (two) times daily.    . Melatonin 5 MG CHEW Chew 2 capsules by mouth at bedtime as needed.    . Multiple Vitamin (MULTIVITAMIN WITH MINERALS) TABS tablet Take 1 tablet by mouth daily.    . norethindrone (MICRONOR,CAMILA,ERRIN) 0.35 MG tablet Take 1 tablet (0.35 mg total) by mouth daily. 3 Package 3  . sertraline (ZOLOFT) 100 MG tablet Take 1 tablet (100 mg total) by mouth daily. 90 tablet 3  . SUMAtriptan (IMITREX) 25 MG tablet Take 1 tablet (25 mg total) by mouth every 2 (two) hours as needed for migraine. May repeat in 2 hours if headache persists or recurs. 10 tablet 2  . topiramate (TOPAMAX) 100 MG tablet Take 1 tablet (100 mg total) by mouth at bedtime. 90 tablet 3  . vitamin C (ASCORBIC ACID) 250 MG tablet Take 250 mg by mouth daily.     No current facility-administered medications on file prior to visit.     PAST MEDICAL HISTORY: Past Medical History:  Diagnosis Date  . Anxiety   . Hyperlipidemia   . Leg edema   . Migraines     PAST SURGICAL HISTORY: History reviewed. No pertinent surgical history.  SOCIAL HISTORY: Social History   Tobacco Use  . Smoking status: Never Smoker  . Smokeless tobacco: Never Used  Substance Use Topics  . Alcohol use: Yes    Comment: occasional  . Drug use: Never    FAMILY HISTORY: Family History  Problem Relation Age of Onset  . Anxiety disorder Mother   . Diabetes Father   . Obesity Father     ROS: Review of Systems  Constitutional: Negative for weight loss.  Cardiovascular: Negative for chest pain and claudication.  Gastrointestinal: Negative for nausea and vomiting.  Musculoskeletal: Negative for myalgias.       Negative muscle weakness    PHYSICAL EXAM: Pt in no acute distress  RECENT LABS AND TESTS: BMET    Component Value Date/Time   NA 140 04/19/2018 0924   K 4.4 04/19/2018 0924   CL 104  04/19/2018 0924   CO2 20 04/19/2018 0924   GLUCOSE 93 04/19/2018 0924   GLUCOSE 84 03/03/2014 1208   BUN 13 04/19/2018 0924   CREATININE 0.65 04/19/2018 0924   CALCIUM 9.5 04/19/2018 0924   GFRNONAA 120 04/19/2018 0924   GFRAA 138 04/19/2018 0924   Lab Results  Component Value Date   HGBA1C 4.9 04/19/2018   HGBA1C 4.7 (L) 12/19/2017   HGBA1C 5.1 07/26/2017   Lab Results  Component Value Date   INSULIN 21.8 04/19/2018   INSULIN 18.0 12/19/2017   INSULIN 14.6 07/26/2017   CBC    Component Value Date/Time   WBC 8.2 06/02/2017 0945   RBC 4.78 06/02/2017 0945   HGB 14.5 06/02/2017 0945   HCT 44.3 06/02/2017 0945   PLT 323 06/02/2017 0945   MCV 93 06/02/2017 0945   MCH 30.3 06/02/2017 0945   MCHC 32.7 06/02/2017  0945   RDW 12.3 06/02/2017 0945   LYMPHSABS 2.4 06/02/2017 0945   EOSABS 0.2 06/02/2017 0945   BASOSABS 0.0 06/02/2017 0945   Iron/TIBC/Ferritin/ %Sat No results found for: IRON, TIBC, FERRITIN, IRONPCTSAT Lipid Panel     Component Value Date/Time   CHOL 225 (H) 04/19/2018 0924   TRIG 117 04/19/2018 0924   HDL 50 04/19/2018 0924   CHOLHDL 5.0 (H) 06/02/2017 0945   CHOLHDL 5.1 03/03/2014 1208   VLDL 41 (H) 03/03/2014 1208   LDLCALC 152 (H) 04/19/2018 0924   Hepatic Function Panel     Component Value Date/Time   PROT 7.0 04/19/2018 0924   ALBUMIN 4.4 04/19/2018 0924   AST 13 04/19/2018 0924   ALT 16 04/19/2018 0924   ALKPHOS 71 04/19/2018 0924   BILITOT 0.3 04/19/2018 0924   BILIDIR 0.12 06/02/2017 0945      Component Value Date/Time   TSH 1.850 07/26/2017 1127      I, Burt KnackSharon Martin, am acting as transcriptionist for Alois Clicheracey Linetta Regner, PA-C I, Alois Clicheracey Caydence Koenig, PA-C have reviewed above note and agree with its content

## 2018-10-04 ENCOUNTER — Other Ambulatory Visit: Payer: Self-pay

## 2018-10-04 DIAGNOSIS — Z20822 Contact with and (suspected) exposure to covid-19: Secondary | ICD-10-CM

## 2018-10-05 ENCOUNTER — Encounter (INDEPENDENT_AMBULATORY_CARE_PROVIDER_SITE_OTHER): Payer: Self-pay | Admitting: Obstetrics & Gynecology

## 2018-10-05 ENCOUNTER — Ambulatory Visit (INDEPENDENT_AMBULATORY_CARE_PROVIDER_SITE_OTHER): Payer: No Typology Code available for payment source | Admitting: Obstetrics & Gynecology

## 2018-10-05 ENCOUNTER — Encounter (INDEPENDENT_AMBULATORY_CARE_PROVIDER_SITE_OTHER): Payer: No Typology Code available for payment source | Admitting: Obstetrics & Gynecology

## 2018-10-05 ENCOUNTER — Other Ambulatory Visit: Payer: No Typology Code available for payment source

## 2018-10-05 VITALS — BP 116/76 | HR 90 | Temp 99.0°F | Ht 64.0 in | Wt 203.9 lb

## 2018-10-05 DIAGNOSIS — N942 Vaginismus: Secondary | ICD-10-CM

## 2018-10-05 DIAGNOSIS — Z30011 Encounter for initial prescription of contraceptive pills: Secondary | ICD-10-CM

## 2018-10-05 DIAGNOSIS — N94810 Vulvar vestibulitis: Secondary | ICD-10-CM

## 2018-10-05 MED ORDER — TRIAMCINOLONE ACETONIDE 0.1 % EX OINT
TOPICAL_OINTMENT | CUTANEOUS | 3 refills | Status: AC
Start: 2018-10-05 — End: ?

## 2018-10-05 MED ORDER — DROSPIRENONE-ETHINYL ESTRADIOL 3-0.02 MG OR TABS
1.0000 | ORAL_TABLET | Freq: Every day | ORAL | 0 refills | Status: DC
Start: 2018-10-05 — End: 2019-10-18

## 2018-10-05 NOTE — Progress Notes (Signed)
[facility]  NEW GYNECOLOGY VISIT    ID/CHIEF COMPLAINT:  Sara Burnett is a 33 year old G57 F who presents for RLQ pain prior to menstrual cycle.   Patient was referred by self.  Interpreter not needed for this visit.     HISTORY OF PRESENT ILLNESS  Patient had nexplanon and everything was good for 6 months, then had removed for abnormal uterine bleeding a year ago. After this, started having dryness in the vagina. No itching or irritation. Does have bleeding with intercourse. Has had dyspareunia since then as well. Has tried some lubricants but that makes it worse. No pain or burning when not attempting penetration. No dysuria. Feels like "trying to put on a shoe that doesn't fit". Has also had RLQ prior to period since the end of last year, beginning of this year. Wraps around flank, couldn't find anything, UA was negative. Lasts 3-4 days, not actively painful but is tender. Typically 4 days before period but came after period this time. Accompanied by pain with knee which has been going on forever.     Past Gyn History:   Patient's last menstrual period was 09/19/2018.  Menarche Age: 79  Menses: Regular, q25-30 days, Duration 4 days  Cramping: mod, Flow heavy, Intermenstrual bleeding: none   Sexually Active: Yes, husband only  History of Sexual Dysfunction: +dyspareunia  History of Sexual, Physical or Mental Abuse: Denies  Contraception: Condoms  Fertility Plans: Not in the near future  STIs: Denies, does not want testing   Pap Smears: Last 06/09/2018 NILM, LSIL pap 2018, HPV pos 2019, colpo x2   Breast health: Not addressed  Gyn Surgeries: None  Gyn Diagnoses: Dyspareunia, dymenorrhea  Gardasil: Did not get, interested     OB HISTORY   OB History   Gravida Para Term Preterm AB Living   0 0 0 0 0 0   SAB TAB Ectopic Multiple Live Births   0 0 0 0 0       ROS: Extended 2-9, Complete 10+   General: Positive for weight gain, fatigue   Eyes: Positive for changes in vision.   Head, Ears, Throat: Negative       Cardiovascular: Positive for palpitations   Respiratory: Negative.   Breast: Positive for breast pain   Gastrointestinal: Positive for bloating, reflux.   Genitourinary: As noted in HPI above   Musculoskeletal: Negative    Skin: Negative    Neurological: Positive for HA.   Hematologic: Negative   Psychiatric: Deferred   Endocrine: Negative     There are no active problems to display for this patient.      PAST MEDICAL HISTORY   Past Medical History:   Diagnosis Date   . Pap smear abnormality of cervix 2018       SURGICAL HISTORY   Past Surgical History:   Procedure Laterality Date   . NO PRIOR SURGERIES         MEDICATIONS     Current Outpatient Medications:   .  drospirenone-ethinyl estradiol (YAZ) 3-0.02 MG tablet, Take 1 tablet by mouth daily., Disp: 16 packet, Rfl: 0  .  triamcinolone 0.1 % ointment, Apply peasized amount to opening of vagina every night until next appointment, Disp: 15 g, Rfl: 3    Review of patient's allergies indicates:  No Known Allergies    FAMILY HISTORY   Family History     Problem (# of Occurrences) Relation (Name,Age of Onset)    Diabetes (2) Maternal  Grandmother, Paternal Grandmother    Heart Disease (1) Other    Osteoporosis (1) Maternal Grandmother    Thyroid Disease (1) Mother       Negative family history of: Breast Cancer, Colon Cancer, Ovarian Cancer, Prostate Cancer, Uterine Cancer          SOCIAL HISTORY   Social History     Social History Narrative    Lives with spouse, works as Chief Financial Officer.     Social History     Tobacco Use   . Smoking status: Former Research scientist (life sciences)   . Smokeless tobacco: Never Used   Substance Use Topics   . Alcohol use: Yes     Alcohol/week: 4.0 standard drinks     Types: 4 Cans of beer per week   . Drug use: Yes     Comment: CBD for sleep       Physical Exam:  1995   Detailed - 5-7 systems, Comprehensive -8+  BP 116/76   Pulse 90   Temp 99 F (37.2 C) (Temporal)   Ht 5\' 4"  (1.626 m)   Wt 203 lb 14.8 oz (92.5 kg)   LMP 09/19/2018   SpO2 97%   Breastfeeding  No   BMI 35.00 kg/m   General: healthy, alert, no distress.  Respiratory: Normal respiratory effort and chest wall movement with respiration.   Abdomen: Soft, nondistended, nontender, no palpable masses   Psychiatric:  Affect appropriate  Neurologic:  Gait:  Normal.  Skin: Skin color, texture, turgor normal. No rashes or concerning lesions on visible areas.  Pelvic Exam: External genitalia normal, normal bartholin/skene/urethral meatus/anus., no pain or tenderness on labia majora or minora, at introitus there is erythema and point tenderness just distal to the hymenal remnant from 2 to 10 o'clock. There is bilateral levator tenderness and hypertonicity. Vagina is rugated and well-estrogenized, cervix normal in appearance, no CMT, no bladder tenderness, uterus normal size, shape, and consistency, no adnexal masses or tenderness.     ASSESSMENT/PLAN  Impression: 33 year old G32 F with dyspareunia, RLQ pain.     # Dyspareunia 2/2 vulvar vestibulitis/provoked localized vulvodynia, vaginismus    - Diagnosed with erythema and tenderness at introitus today  - Education on condition provided  - Yeast culture done  - Will start with triamcinolone ointment nightly x2 months, then reassess  - Referral to pelvic floor PT placed    # RLQ pain  - Given menstrual timing, will restart hormonal contraception with menstrual suppression. Has been on yasmin in the past. Discussed possible increased risk of VTE with yasmin/yaz, will do yaz since lower estrogen.  - If pain not improved, consider TVUS    Follow Up Plan: 2-3 months   Note forwarded to referring provider. ;

## 2018-10-05 NOTE — Patient Instructions (Signed)
DEPARTMENT OF OB/GYN  VULVOVAGINAL SPECIALTY CLINIC  GUIDELINES FOR VULVAR SKIN CARE    BATHING AND HYGIENE  1. Use unscented products/sensitive skin products for washing  a. Suggestions: Cetaphil, Neutrogena, Dove  2. Avoid using soap around the genitals. We recommend gentle rinsing with water only.   3. Avoid bubble baths and bath salts.  4. Avoid scrubbing, shaving, waxing, or vigorous drying of the genitals. Pat dry or use a hair dryer on a cool setting.  5. Avoid feminine sprays, douching, baby wipes, scented toilet paper, and scented pads and tampons. Consider use of all cotton pads/tampons.      LAUNDRY  1. Use unscented laundry detergent (no perfumes, no dyes).  a. Suggestions: All Free and Clear, Woolite Gentle Cycle  2. Use only the recommended amount of detergent for your washing machine, and consider double rinsing your underwear.                                                                                                                                 3. Avoid fabric softener or dryer sheets on any clothing or towels that contacts the vulva.      CLOTHING  1. Avoid pantyhose.  2. Remove bathing suit and any exercise clothes after activity.  3. Wear white, all cotton underwear. It is ok to sleep with no underwear at night.   4. Avoid tight fitting and synthetic fabric clothing.      LUBRICANTS AND SKIN CARE PRODUCTS  1. Use a water based lubricant (no fragrance)  a. Suggestions: Astroglide, Slippery Stuff  2. Coconut oil may be used for dry/cracked skin.  3. Do not douche.  4. Avoid spermicides, Vagisil, and Monistat.  5. Avoid over the counter creams and treatments unless told to use them.  6. Avoid genital hair removing products.      VULVAR VESTIBULITIS    INTRODUCTION  Vulvar vestibulitis is a condition that causes pain with touch or attempted intercourse from inflammation present at the vaginal entrance. The initial cause of this very complex and chronic condition is not usually known. However,  a particularly severe or prolonged yeast infection appears to be a trigger in some cases. If the inflammation is present long enough, the nerve endings become sensitized to cause increased pain.  Further, deep pelvic pain can occur if patients contract their pelvic muscles and cause pelvic muscle spasm.    SYMPTOMS  The most common symptom is pain with initial penetration during attempted intercourse. The pain can be so severe that intercourse is not possible, but it also can be less severe, so that intercourse is possible, but not pleasant. Pain also can occur with placing a tampon into the vagina or even with sitting or walking. Deep pelvic pain can occur from pelvic muscle spasms.    CLINICAL FINDINGS  The most consistent finding is redness and tenderness from inflammation at the vaginal opening. The degree and extent of the   redness and tenderness varies greatly form a small area of minimal tenderness to larger areas of exquisite tenderness to even light touch. Pelvic muscle tenderness and tightness may be present.    TREATMENT  No uniform treatment of vestibulitis exists, given the unknown cause and complex nature. We use a several treatment approaches for vulvar vestibulitis.      First, we rule out any inciting factors. If yeast is present, it is reasonable to both treat the present infection and suppress the presence of yeast for months.     Second, we work to decrease the inflammation. As the inflammation decreases pain also often decreases. Our clinic uses topical steroids, which can lessen the redness and tenderness, especially if started within 6-12 months after onset. However, vestibulitis of a long duration of over 2 years is difficult to successfully treat. We also use both topical tacrolimus and injection of steroids into the inflamed tissue itself to reduce inflammation. Occasionally, surgical removal of the inflamed tissue is used to reduce symptoms.     Third, patients with severe pain can use  intermittent ice on the vulva. (Ice packs should be covered and not used for more than 20 minutes at a time). Topical lidocaine ointment can also provide temporary relief during severe pain or allow intercourse with less pain. Pelvic muscle spasm can respond to physical therapy. Other groups more commonly use drugs such as nortripytline and gabapentin to reduce the nerve response and pain. These drugs can be helpful but have side effects.    RECURRENCE  Once the inflammation and tenderness of vulvar vestibulitis is reduced, recurrence is  not common, unless a vulvar infection, such as yeast develops.    PREVENTION    Little is known about prevention.

## 2018-10-09 ENCOUNTER — Encounter (INDEPENDENT_AMBULATORY_CARE_PROVIDER_SITE_OTHER): Payer: Self-pay | Admitting: Obstetrics & Gynecology

## 2018-10-09 ENCOUNTER — Other Ambulatory Visit: Payer: Self-pay

## 2018-10-09 ENCOUNTER — Encounter (INDEPENDENT_AMBULATORY_CARE_PROVIDER_SITE_OTHER): Payer: Self-pay | Admitting: Physician Assistant

## 2018-10-09 ENCOUNTER — Ambulatory Visit (INDEPENDENT_AMBULATORY_CARE_PROVIDER_SITE_OTHER): Payer: No Typology Code available for payment source | Admitting: Physician Assistant

## 2018-10-09 DIAGNOSIS — E669 Obesity, unspecified: Secondary | ICD-10-CM

## 2018-10-09 DIAGNOSIS — E66811 Obesity, class 1: Secondary | ICD-10-CM

## 2018-10-09 DIAGNOSIS — Z6832 Body mass index (BMI) 32.0-32.9, adult: Secondary | ICD-10-CM

## 2018-10-09 DIAGNOSIS — E559 Vitamin D deficiency, unspecified: Secondary | ICD-10-CM

## 2018-10-09 NOTE — Telephone Encounter (Addendum)
ROI Received on: 10/09/2018    Request Sent to: [place]    Records needed: Comprehensive Overview    Request was sent via: Fax; faxed to [phone]    Contact number for follow up: [phone]       11:22 AM 10/09/2018 Conversion Error Record   Successfully created cover sheet.  Type: application/x-pcl  G3 to TIFF #1: Success (67ms)  Image Optimization #1: Success (15ms)  PC     11:23 AM 10/09/2018 Transmission Record   Sent to: [place]   Phone: [phone]   Billing information: '', ''   Remote ID: Fax   Unique ID: "[id number]"   Elapsed time: 2 minutes, 43 seconds.   Used channel 18 on server "[email or web address]".   No ANI data.   No AOC data.   Resulting status code (0/339; 0/0): Success   Pages sent: 1 - 3   Delegate ID: ""

## 2018-10-10 NOTE — Progress Notes (Signed)
Office: [phone]  /  Fax: [phone] TeleHealth Visit:  Alison Davies has verbally consented to this TeleHealth visit today. The patient is located at home, the provider is located at the [facility] office. The participants in this visit include the listed provider and patient. The visit was conducted today via Face Time.  HPI:   Chief Complaint: OBESITY Alison Davies is here to discuss her progress with her obesity treatment plan. She is keeping a food journal with 1500 calories and 95+ grams of protein and is following her eating plan approximately 78 % of the time. She states she is exercising 0 minutes 0 times per week. Alison Davies's most recent weight is 187 pounds. 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  We were unable to weigh the patient today for this TeleHealth visit. She feels as if she has gained weight since her last visit. She has lost 13 lbs since starting treatment with us.  Vitamin D Deficiency Alison Davies has a diagnosis of vitamin D deficiency. She is currently on vit D. Alison Davies denies nausea, vomiting, or muscle weakness.  ASSESSMENT AND PLAN:  Vitamin D deficiency  Class 1 obesity with serious comorbidity and body mass index (BMI) of 32.0 to 32.9 in adult, unspecified obesity type  PLAN:  Vitamin D Deficiency Alison Davies was informed that low vitamin D levels contribute to fatigue and are associated with obesity, breast, and colon cancer. Alison Davies agrees to continue to take prescription Vit D @50 ,000 IU every week and will follow up for routine testing of vitamin D, at least 2-3 times per year. She was informed of the risk of over-replacement of vitamin D and agrees to not increase her dose unless she discusses this with us first. Alison Davies agrees to follow up in 2 weeks as directed.  Time spent on this visit was 25 minutes.  Obesity Alison Davies is currently in the action stage of change. As such, her goal is to continue with weight  loss efforts. She has agreed to keep a food journal with 1500 calories and 95 grams of protein.  Alison Davies has been instructed to work up to a goal of 150 minutes of combined cardio and strengthening exercise per week for weight loss and overall health benefits. We discussed the following Behavioral Modification Strategies today: increasing lean protein intake and work on meal planning and easy cooking plans.  Alison Davies has agreed to follow up with our clinic in 2 weeks. She was informed of the importance of frequent follow up visits to maximize her success with intensive lifestyle modifications for her multiple health conditions.  I spent > than 50% of the 25 minute visit on counseling as documented in the note.  ALLERGIES: No Known Allergies  MEDICATIONS: Current Outpatient Medications on File Prior to Visit  Medication Sig Dispense Refill  . ALPRAZolam (XANAX) 0.25 MG tablet Take 1 tablet (0.25 mg total) by mouth at bedtime as needed for anxiety. 30 tablet 0  . Biotin 3 MG TABS Take 2 tablets by mouth daily.    . Cyanocobalamin (VITAMIN B-12 CR) 1500 MCG TBCR Take 2 tablets by mouth 2 (two) times daily.    . Melatonin 5 MG CHEW Chew 2 capsules by mouth at bedtime as needed.    . Multiple Vitamin (MULTIVITAMIN WITH MINERALS) TABS tablet Take 1 tablet by mouth daily.    . norethindrone (MICRONOR,CAMILA,ERRIN) 0.35 MG tablet Take 1 tablet (0.35 mg total) by mouth daily. 3 Package  3  . sertraline (ZOLOFT) 100 MG tablet Take 1 tablet (100 mg total) by mouth daily. 90 tablet 3  . SUMAtriptan (IMITREX) 25 MG tablet Take 1 tablet (25 mg total) by mouth every 2 (two) hours as needed for migraine. May repeat in 2 hours if headache persists or recurs. 10 tablet 2  . topiramate (TOPAMAX) 100 MG tablet Take 1 tablet (100 mg total) by mouth at bedtime. 90 tablet 3  . vitamin C (ASCORBIC ACID) 250 MG tablet Take 250 mg by mouth daily.    . Vitamin D, Ergocalciferol, (DRISDOL) 1.25 MG (50000 UT) CAPS capsule  Take 1 capsule (50,000 Units total) by mouth every 7 (seven) days. 4 capsule 0   No current facility-administered medications on file prior to visit.     PAST MEDICAL HISTORY: Past Medical History:  Diagnosis Date  . Anxiety   . Hyperlipidemia   . Leg edema   . Migraines     PAST SURGICAL HISTORY: History reviewed. No pertinent surgical history.  SOCIAL HISTORY: Social History   Tobacco Use  . Smoking status: Never Smoker  . Smokeless tobacco: Never Used  Substance Use Topics  . Alcohol use: Yes    Comment: occasional  . Drug use: Never    FAMILY HISTORY: Family History  Problem Relation Age of Onset  . Anxiety disorder Mother   . Diabetes Father   . Obesity Father     ROS: Review of Systems  Gastrointestinal: Negative for nausea and vomiting.  Musculoskeletal:       Negative for muscle weakness.    PHYSICAL EXAM: Pt in no acute distress  RECENT LABS AND TESTS: BMET    Component Value Date/Time   NA 140 04/19/2018 0924   K 4.4 04/19/2018 0924   CL 104 04/19/2018 0924   CO2 20 04/19/2018 0924   GLUCOSE 93 04/19/2018 0924   GLUCOSE 84 03/03/2014 1208   BUN 13 04/19/2018 0924   CREATININE 0.65 04/19/2018 0924   CALCIUM 9.5 04/19/2018 0924   GFRNONAA 120 04/19/2018 0924   GFRAA 138 04/19/2018 0924   Lab Results  Component Value Date   HGBA1C 4.9 04/19/2018   HGBA1C 4.7 (L) 12/19/2017   HGBA1C 5.1 07/26/2017   Lab Results  Component Value Date   INSULIN 21.8 04/19/2018   INSULIN 18.0 12/19/2017   INSULIN 14.6 07/26/2017   CBC    Component Value Date/Time   WBC 8.2 06/02/2017 0945   RBC 4.78 06/02/2017 0945   HGB 14.5 06/02/2017 0945   HCT 44.3 06/02/2017 0945   PLT 323 06/02/2017 0945   MCV 93 06/02/2017 0945   MCH 30.3 06/02/2017 0945   MCHC 32.7 06/02/2017 0945   RDW 12.3 06/02/2017 0945   LYMPHSABS 2.4 06/02/2017 0945   EOSABS 0.2 06/02/2017 0945   BASOSABS 0.0 06/02/2017 0945   Iron/TIBC/Ferritin/ %Sat No results found for:  IRON, TIBC, FERRITIN, IRONPCTSAT Lipid Panel     Component Value Date/Time   CHOL 225 (H) 04/19/2018 0924   TRIG 117 04/19/2018 0924   HDL 50 04/19/2018 0924   CHOLHDL 5.0 (H) 06/02/2017 0945   CHOLHDL 5.1 03/03/2014 1208   VLDL 41 (H) 03/03/2014 1208   LDLCALC 152 (H) 04/19/2018 0924   Hepatic Function Panel     Component Value Date/Time   PROT 7.0 04/19/2018 0924   ALBUMIN 4.4 04/19/2018 0924   AST 13 04/19/2018 0924   ALT 16 04/19/2018 0924   ALKPHOS 71 04/19/2018 0924   BILITOT 0.3 04/19/2018  7939   BILIDIR 0.12 06/02/2017 0945      Component Value Date/Time   TSH 1.850 07/26/2017 1127    Results for SCOTLYN, MCCRANIE (MRN [id number]) as of 10/10/2018 16:03  Ref. Range 04/19/2018 09:24  Vitamin D, 25-Hydroxy Latest Ref Range: 30.0 - 100.0 ng/mL 35.4    I, Marcille Blanco, CMA, am acting as transcriptionist for Abby Potash, PA-C I, Abby Potash, PA-C have reviewed above note and agree with its content

## 2018-10-11 LAB — R/O YEAST CULT W/DIRECT EXAM: Stain For Fungus: NONE SEEN

## 2018-10-17 ENCOUNTER — Other Ambulatory Visit (INDEPENDENT_AMBULATORY_CARE_PROVIDER_SITE_OTHER): Payer: Self-pay | Admitting: Physician Assistant

## 2018-10-17 DIAGNOSIS — E559 Vitamin D deficiency, unspecified: Secondary | ICD-10-CM

## 2018-10-18 ENCOUNTER — Encounter (HOSPITAL_BASED_OUTPATIENT_CLINIC_OR_DEPARTMENT_OTHER): Payer: Self-pay

## 2018-10-23 ENCOUNTER — Telehealth (INDEPENDENT_AMBULATORY_CARE_PROVIDER_SITE_OTHER): Payer: No Typology Code available for payment source | Admitting: Physician Assistant

## 2018-10-23 NOTE — Telephone Encounter (Signed)
2nd request sent.     9:33 AM 10/23/2018 Conversion Error Record   Successfully created cover sheet.  Type: application/x-pcl  G3 to TIFF #1: Success (66ms)  Image Optimization #1: Success (57ms)  PC     9:33 AM 10/23/2018 Transmission Record   Sent to: [place]   Phone: [phone]   Billing information: '', ''   Remote ID: Fax   Unique ID: "[email or web address]"   Elapsed time: 2 minutes, 43 seconds.   Used channel 14 on server "[email or web address]".   No ANI data.   No AOC data.   Resulting status code (0/339; 0/0): Success   Pages sent: 1 - 3   Delegate ID: ""

## 2018-10-24 ENCOUNTER — Other Ambulatory Visit: Payer: Self-pay

## 2018-10-24 ENCOUNTER — Ambulatory Visit (INDEPENDENT_AMBULATORY_CARE_PROVIDER_SITE_OTHER): Payer: No Typology Code available for payment source | Admitting: Physician Assistant

## 2018-10-24 VITALS — BP 112/78 | HR 83 | Temp 98.2°F | Ht 63.0 in | Wt 185.0 lb

## 2018-10-24 DIAGNOSIS — E559 Vitamin D deficiency, unspecified: Secondary | ICD-10-CM

## 2018-10-24 DIAGNOSIS — E669 Obesity, unspecified: Secondary | ICD-10-CM | POA: Diagnosis not present

## 2018-10-24 DIAGNOSIS — Z9189 Other specified personal risk factors, not elsewhere classified: Secondary | ICD-10-CM | POA: Diagnosis not present

## 2018-10-24 DIAGNOSIS — E7849 Other hyperlipidemia: Secondary | ICD-10-CM

## 2018-10-24 DIAGNOSIS — Z6832 Body mass index (BMI) 32.0-32.9, adult: Secondary | ICD-10-CM

## 2018-10-24 MED ORDER — VITAMIN D (ERGOCALCIFEROL) 1.25 MG (50000 UNIT) PO CAPS: 50000.0000 [IU] | ORAL_CAPSULE | ORAL | 0 refills | Status: DC

## 2018-10-24 NOTE — Progress Notes (Signed)
Office: [phone]  /  Fax: [phone]   HPI:   Chief Complaint: OBESITY Alison Davies is here to discuss her progress with her obesity treatment plan. She is on the keep a food journal with 1500 calories and 95 grams of protein daily and is following her eating plan approximately 40 % of the time. She states she is walking more at work. Alison Davies reports that she has stopped consistently journaling. She is not cooking and is eating out more often.  Her weight is 185 lb (83.9 kg) today and has had a weight loss of 1 pound over a period of 2 weeks since her last visit. She has lost 14 lbs since starting treatment with [name].  Vitamin D Deficiency Alison Davies has a diagnosis of vitamin D deficiency. She is currently taking prescription Vit D and denies nausea, vomiting or muscle weakness.  At risk for osteopenia and osteoporosis Alison Davies is at higher risk of osteopenia and osteoporosis due to vitamin D deficiency.   Hyperlipidemia Alison Davies has hyperlipidemia and has been trying to improve her cholesterol levels with intensive lifestyle modification including a low saturated fat diet, exercise and weight loss. She is not on any medications and denies any chest pain, claudication or myalgias.  ASSESSMENT AND PLAN:  Vitamin D deficiency - Plan: Vitamin D, Ergocalciferol, (DRISDOL) 1.25 MG (50000 UT) CAPS capsule  Other hyperlipidemia  At risk for osteoporosis  Class 1 obesity with serious comorbidity and body mass index (BMI) of 32.0 to 32.9 in adult, unspecified obesity type  PLAN:  Vitamin D Deficiency Alison Davies was informed that low vitamin D levels contributes to fatigue and are associated with obesity, breast, and colon cancer. Asanti agrees to continue taking prescription Vit D 50,000 IU every week #4 and we will refill for 1 month. She will follow up for routine testing of vitamin D, at least 2-3 times per year. She was informed of the risk of over-replacement of vitamin D and agrees to not increase her  dose unless she discusses this with [name] first. Tristen agrees to follow up with our clinic in 3 weeks.  At risk for osteopenia and osteoporosis Alison Davies was given extended (15 minutes) osteoporosis prevention counseling today. Alison Davies is at risk for osteopenia and osteoporsis due to her vitamin D deficiency. She was encouraged to take her vitamin D and follow her higher calcium diet and increase strengthening exercise to help strengthen her bones and decrease her risk of osteopenia and osteoporosis.  Hyperlipidemia Alison Davies was informed of the American Heart Association Guidelines emphasizing intensive lifestyle modifications as the first line treatment for hyperlipidemia. We discussed many lifestyle modifications today in depth, and Early will continue to work on decreasing saturated fats such as fatty red meat, butter and many fried foods. She will also increase vegetables and lean protein in her diet and continue to work on exercise and weight loss efforts. Alison Davies agrees to follow up with our clinic in 3 weeks.  Obesity Alison Davies is currently in the action stage of change. As such, her goal is to continue with weight loss efforts She has agreed to keep a food journal with 1500 calories and 95 grams of protein daily Alison Davies has been instructed to work up to a goal of 150 minutes of combined cardio and strengthening exercise per week for weight loss and overall health benefits. We discussed the following Behavioral Modification Strategies today: decrease eating out and work on meal planning and easy cooking plans   Alison Davies has agreed to follow up with our  clinic in 3 weeks. She was informed of the importance of frequent follow up visits to maximize her success with intensive lifestyle modifications for her multiple health conditions.  ALLERGIES: No Known Allergies  MEDICATIONS: Current Outpatient Medications on File Prior to Visit  Medication Sig Dispense Refill   ALPRAZolam (XANAX) 0.25 MG tablet Take 1  tablet (0.25 mg total) by mouth at bedtime as needed for anxiety. 30 tablet 0   Biotin 3 MG TABS Take 2 tablets by mouth daily.     Cyanocobalamin (VITAMIN B-12 CR) 1500 MCG TBCR Take 2 tablets by mouth 2 (two) times daily.     Melatonin 5 MG CHEW Chew 2 capsules by mouth at bedtime as needed.     Multiple Vitamin (MULTIVITAMIN WITH MINERALS) TABS tablet Take 1 tablet by mouth daily.     norethindrone (MICRONOR,CAMILA,ERRIN) 0.35 MG tablet Take 1 tablet (0.35 mg total) by mouth daily. 3 Package 3   sertraline (ZOLOFT) 100 MG tablet Take 1 tablet (100 mg total) by mouth daily. 90 tablet 3   SUMAtriptan (IMITREX) 25 MG tablet Take 1 tablet (25 mg total) by mouth every 2 (two) hours as needed for migraine. May repeat in 2 hours if headache persists or recurs. 10 tablet 2   topiramate (TOPAMAX) 100 MG tablet Take 1 tablet (100 mg total) by mouth at bedtime. 90 tablet 3   vitamin C (ASCORBIC ACID) 250 MG tablet Take 250 mg by mouth daily.     No current facility-administered medications on file prior to visit.     PAST MEDICAL HISTORY: Past Medical History:  Diagnosis Date   Anxiety    Hyperlipidemia    Leg edema    Migraines     PAST SURGICAL HISTORY: No past surgical history on file.  SOCIAL HISTORY: Social History   Tobacco Use   Smoking status: Never Smoker   Smokeless tobacco: Never Used  Substance Use Topics   Alcohol use: Yes    Comment: occasional   Drug use: Never    FAMILY HISTORY: Family History  Problem Relation Age of Onset   Anxiety disorder Mother    Diabetes Father    Obesity Father     ROS: Review of Systems  Constitutional: Positive for weight loss.  Cardiovascular: Negative for chest pain and claudication.  Gastrointestinal: Negative for nausea and vomiting.  Musculoskeletal: Negative for myalgias.       Negative muscle weakness    PHYSICAL EXAM: Blood pressure 112/78, pulse 83, temperature 98.2 F (36.8 C), temperature source  Oral, height 5\' 3"  (1.6 m), weight 185 lb (83.9 kg), SpO2 96 %. Body mass index is 32.77 kg/m. Physical Exam Vitals signs reviewed.  Constitutional:      Appearance: Normal appearance. She is obese.  Cardiovascular:     Rate and Rhythm: Normal rate.     Pulses: Normal pulses.  Pulmonary:     Effort: Pulmonary effort is normal.     Breath sounds: Normal breath sounds.  Musculoskeletal: Normal range of motion.  Skin:    General: Skin is warm and dry.  Neurological:     Mental Status: She is alert and oriented to person, place, and time.  Psychiatric:        Mood and Affect: Mood normal.        Behavior: Behavior normal.     RECENT LABS AND TESTS: BMET    Component Value Date/Time   NA 140 04/19/2018 0924   K 4.4 04/19/2018 0924   CL 104  04/19/2018 0924   CO2 20 04/19/2018 0924   GLUCOSE 93 04/19/2018 0924   GLUCOSE 84 03/03/2014 1208   BUN 13 04/19/2018 0924   CREATININE 0.65 04/19/2018 0924   CALCIUM 9.5 04/19/2018 0924   GFRNONAA 120 04/19/2018 0924   GFRAA 138 04/19/2018 0924   Lab Results  Component Value Date   HGBA1C 4.9 04/19/2018   HGBA1C 4.7 (L) 12/19/2017   HGBA1C 5.1 07/26/2017   Lab Results  Component Value Date   INSULIN 21.8 04/19/2018   INSULIN 18.0 12/19/2017   INSULIN 14.6 07/26/2017   CBC    Component Value Date/Time   WBC 8.2 06/02/2017 0945   RBC 4.78 06/02/2017 0945   HGB 14.5 06/02/2017 0945   HCT 44.3 06/02/2017 0945   PLT 323 06/02/2017 0945   MCV 93 06/02/2017 0945   MCH 30.3 06/02/2017 0945   MCHC 32.7 06/02/2017 0945   RDW 12.3 06/02/2017 0945   LYMPHSABS 2.4 06/02/2017 0945   EOSABS 0.2 06/02/2017 0945   BASOSABS 0.0 06/02/2017 0945   Iron/TIBC/Ferritin/ %Sat No results found for: IRON, TIBC, FERRITIN, IRONPCTSAT Lipid Panel     Component Value Date/Time   CHOL 225 (H) 04/19/2018 0924   TRIG 117 04/19/2018 0924   HDL 50 04/19/2018 0924   CHOLHDL 5.0 (H) 06/02/2017 0945   CHOLHDL 5.1 03/03/2014 1208   VLDL 41 (H)  03/03/2014 1208   LDLCALC 152 (H) 04/19/2018 0924   Hepatic Function Panel     Component Value Date/Time   PROT 7.0 04/19/2018 0924   ALBUMIN 4.4 04/19/2018 0924   AST 13 04/19/2018 0924   ALT 16 04/19/2018 0924   ALKPHOS 71 04/19/2018 0924   BILITOT 0.3 04/19/2018 0924   BILIDIR 0.12 06/02/2017 0945      Component Value Date/Time   TSH 1.850 07/26/2017 1127      OBESITY BEHAVIORAL INTERVENTION VISIT  Today's visit was # 23   Starting weight: 199 lbs Starting date: 07/26/17 Today's weight : 185 lbs Today's date: 10/24/2018 Total lbs lost to date: 14    ASK: We discussed the diagnosis of obesity with Alison Davies M Lona today and Alison Davies agreed to give us permission to discuss obesity behavioral modification therapy today.  ASSESS: Alison Davies has the diagnosis of obesity and her BMI today is 32.78 Alison Davies is in the action stage of change   ADVISE: Alison Davies was educated on the multiple health risks of obesity as well as the benefit of weight loss to improve her health. She was advised of the need for long term treatment and the importance of lifestyle modifications to improve her current health and to decrease her risk of future health problems.  AGREE: Multiple dietary modification options and treatment options were discussed and  Alison Davies agreed to follow the recommendations documented in the above note.  ARRANGE: Alison Davies was educated on the importance of frequent visits to treat obesity as outlined per CMS and USPSTF guidelines and agreed to schedule her next follow up appointment today.  Trude McburneyI, Abagale Boulos, am acting as transcriptionist for Alois Clicheracey Aguilar, PA-C I, Alois Clicheracey Aguilar, PA-C have reviewed above note and agree with its content

## 2018-10-29 MED FILL — SERTRALINE HCL 100 MG TABS: 100 | 90 days supply | Qty: 90 | Fill #1

## 2018-11-06 NOTE — Telephone Encounter (Signed)
3rd request made on: 11/06/2018    Request Sent to: [facility]     Records needed: Comp overview    Request was sent via: Fax; faxed to [phone]    Contact number for follow up: [phone]

## 2018-11-06 NOTE — Telephone Encounter (Signed)
Your fax has been successfully sent to [facility] at [phone].  ------------------------------------------------------------  From: Harvel Quale  ------------------------------------------------------------  11/06/2018 9:33:25 AM Transmission Record          Sent to [phone] with remote ID "Fax"          Inbound user ID [facility], routing code [id number]          Result: (0/339;0/0) Success          Page record: 1 - 3          Elapsed time: 02:44 on channel 15

## 2018-11-13 ENCOUNTER — Other Ambulatory Visit: Payer: Self-pay

## 2018-11-13 ENCOUNTER — Encounter (INDEPENDENT_AMBULATORY_CARE_PROVIDER_SITE_OTHER): Payer: Self-pay | Admitting: Physician Assistant

## 2018-11-13 ENCOUNTER — Ambulatory Visit (INDEPENDENT_AMBULATORY_CARE_PROVIDER_SITE_OTHER): Payer: No Typology Code available for payment source | Admitting: Physician Assistant

## 2018-11-13 VITALS — BP 117/73 | HR 79 | Temp 98.2°F | Ht 63.0 in | Wt 185.0 lb

## 2018-11-13 DIAGNOSIS — E7849 Other hyperlipidemia: Secondary | ICD-10-CM

## 2018-11-13 DIAGNOSIS — E559 Vitamin D deficiency, unspecified: Secondary | ICD-10-CM

## 2018-11-13 DIAGNOSIS — E8881 Metabolic syndrome: Secondary | ICD-10-CM | POA: Diagnosis not present

## 2018-11-13 DIAGNOSIS — Z9189 Other specified personal risk factors, not elsewhere classified: Secondary | ICD-10-CM

## 2018-11-13 DIAGNOSIS — E669 Obesity, unspecified: Secondary | ICD-10-CM

## 2018-11-13 DIAGNOSIS — Z6832 Body mass index (BMI) 32.0-32.9, adult: Secondary | ICD-10-CM

## 2018-11-13 NOTE — Progress Notes (Signed)
Office: [phone]  /  Fax: [phone]   HPI:   Chief Complaint: OBESITY Lafonda MossesDiana is here to discuss her progress with her obesity treatment plan. She is on the  keep a food journal with 1500 calories and 95g of protein daily and is following her eating plan approximately 75 % of the time. She states she is exercising 0 minutes 0 times per week. Lafonda MossesDiana reports that she has been working a lot and has had trouble staying on track.  Her weight is 185 lb (83.9 kg) today and has not lost weight since her last visit. She has lost 14 lbs since starting treatment with us.  Vitamin D deficiency Lafonda MossesDiana has a diagnosis of vitamin D deficiency. She is currently taking vit D and denies nausea, vomiting or muscle weakness.  Hyperlipidemia Lafonda MossesDiana has hyperlipidemia and has been trying to improve her cholesterol levels with intensive lifestyle modification including a low saturated fat diet, exercise and weight loss. She denies any chest pain, claudication or myalgias. She is not currently taking any medications.   Insulin Resistance Lafonda MossesDiana has a diagnosis of insulin resistance based on her elevated fasting insulin level >5. Although Evangelynn's blood glucose readings are still under good control, insulin resistance puts her at greater risk of metabolic syndrome and diabetes. She is not taking metformin currently and continues to work on diet and exercise to decrease risk of diabetes. She denies polyphagia.   At risk for cardiovascular disease Lafonda MossesDiana is at a higher than average risk for cardiovascular disease due to obesity. She currently denies any chest pain.  ASSESSMENT AND PLAN:  Vitamin D deficiency - Plan: VITAMIN D 25 Hydroxy (Vit-D Deficiency, Fractures)  Other hyperlipidemia - Plan: Lipid Panel With LDL/HDL Ratio  Insulin resistance - Plan: Hemoglobin A1c, Insulin, random, Comprehensive metabolic panel  At risk for heart disease  Class 1 obesity with serious comorbidity and body mass index (BMI)  of 32.0 to 32.9 in adult, unspecified obesity type  PLAN: Vitamin D Deficiency Lafonda MossesDiana was informed that low vitamin D levels contributes to fatigue and are associated with obesity, breast, and colon cancer. She agrees to continue to take prescription Vit D @50 ,000 IU every week and will follow up for routine testing of vitamin D, at least 2-3 times per year. She was informed of the risk of over-replacement of vitamin D and agrees to not increase her dose unless she discusses this with us first. We will repeat labs.   Hyperlipidemia Lafonda MossesDiana was informed of the American Heart Association Guidelines emphasizing intensive lifestyle modifications as the first line treatment for hyperlipidemia. We discussed many lifestyle modifications today in depth, and Lafonda MossesDiana will continue to work on decreasing saturated fats such as fatty red meat, butter and many fried foods. She will also increase vegetables and lean protein in her diet and continue to work on exercise and weight loss efforts. We will repeat labs.   Insulin Resistance Lafonda MossesDiana will continue to work on weight loss, exercise, and decreasing simple carbohydrates in her diet to help decrease the risk of diabetes. We dicussed metformin including benefits and risks. She was informed that eating too many simple carbohydrates or too many calories at one sitting increases the likelihood of GI side effects. Lafonda MossesDiana agreed to follow up with us as directed to monitor her progress. We will repeat labs.   Cardiovascular risk counselling Lafonda MossesDiana was given extended (15 minutes) coronary artery disease prevention counseling today. She is 33 y.o. female and has risk factors for heart disease including  obesity. We discussed intensive lifestyle modifications today with an emphasis on specific weight loss instructions and strategies. Pt was also informed of the importance of increasing exercise and decreasing saturated fats to help prevent heart disease.  Obesity Lafonda MossesDiana is currently  in the action stage of change. As such, her goal is to continue with weight loss efforts She has agreed to keep a food journal with 1500 calories and 95g of protein daily.  Lafonda MossesDiana has been instructed to work up to a goal of 150 minutes of combined cardio and strengthening exercise per week for weight loss and overall health benefits. We discussed the following Behavioral Modification Stratagies today: work on meal planning and easy cooking plans and keeping healthy meals in the home.    Lafonda MossesDiana has agreed to follow up with our clinic in 3 weeks. She was informed of the importance of frequent follow up visits to maximize her success with intensive lifestyle modifications for her multiple health conditions.  ALLERGIES: No Known Allergies  MEDICATIONS: Current Outpatient Medications on File Prior to Visit  Medication Sig Dispense Refill  . ALPRAZolam (XANAX) 0.25 MG tablet Take 1 tablet (0.25 mg total) by mouth at bedtime as needed for anxiety. 30 tablet 0  . Biotin 3 MG TABS Take 2 tablets by mouth daily.    . Cyanocobalamin (VITAMIN B-12 CR) 1500 MCG TBCR Take 2 tablets by mouth 2 (two) times daily.    . Melatonin 5 MG CHEW Chew 2 capsules by mouth at bedtime as needed.    . Multiple Vitamin (MULTIVITAMIN WITH MINERALS) TABS tablet Take 1 tablet by mouth daily.    . norethindrone (MICRONOR,CAMILA,ERRIN) 0.35 MG tablet Take 1 tablet (0.35 mg total) by mouth daily. 3 Package 3  . sertraline (ZOLOFT) 100 MG tablet Take 1 tablet (100 mg total) by mouth daily. 90 tablet 3  . SUMAtriptan (IMITREX) 25 MG tablet Take 1 tablet (25 mg total) by mouth every 2 (two) hours as needed for migraine. May repeat in 2 hours if headache persists or recurs. 10 tablet 2  . topiramate (TOPAMAX) 100 MG tablet Take 1 tablet (100 mg total) by mouth at bedtime. 90 tablet 3  . vitamin C (ASCORBIC ACID) 250 MG tablet Take 250 mg by mouth daily.    . Vitamin D, Ergocalciferol, (DRISDOL) 1.25 MG (50000 UT) CAPS capsule Take 1  capsule (50,000 Units total) by mouth every 7 (seven) days. 4 capsule 0   No current facility-administered medications on file prior to visit.     PAST MEDICAL HISTORY: Past Medical History:  Diagnosis Date  . Anxiety   . Hyperlipidemia   . Leg edema   . Migraines     PAST SURGICAL HISTORY: History reviewed. No pertinent surgical history.  SOCIAL HISTORY: Social History   Tobacco Use  . Smoking status: Never Smoker  . Smokeless tobacco: Never Used  Substance Use Topics  . Alcohol use: Yes    Comment: occasional  . Drug use: Never    FAMILY HISTORY: Family History  Problem Relation Age of Onset  . Anxiety disorder Mother   . Diabetes Father   . Obesity Father     ROS: Review of Systems  Constitutional: Negative for weight loss.  Cardiovascular: Negative for chest pain and claudication.  Gastrointestinal: Negative for nausea.  Musculoskeletal: Negative for myalgias.       Negative for muscle weakness  Endo/Heme/Allergies:       Negative for polyphagia     PHYSICAL EXAM: Blood pressure 117/73,  pulse 79, temperature 98.2 F (36.8 C), height 5\' 3"  (1.6 m), weight 185 lb (83.9 kg), SpO2 98 %. Body mass index is 32.77 kg/m. Physical Exam Vitals signs reviewed.  Constitutional:      Appearance: Normal appearance. She is obese.  HENT:     Head: Normocephalic.     Nose: Nose normal.  Neck:     Musculoskeletal: Normal range of motion.  Cardiovascular:     Rate and Rhythm: Normal rate.  Pulmonary:     Effort: Pulmonary effort is normal.  Musculoskeletal: Normal range of motion.  Skin:    General: Skin is warm and dry.  Neurological:     Mental Status: She is alert and oriented to person, place, and time.  Psychiatric:        Mood and Affect: Mood normal.        Behavior: Behavior normal.     RECENT LABS AND TESTS: BMET    Component Value Date/Time   NA 140 04/19/2018 0924   K 4.4 04/19/2018 0924   CL 104 04/19/2018 0924   CO2 20 04/19/2018 0924    GLUCOSE 93 04/19/2018 0924   GLUCOSE 84 03/03/2014 1208   BUN 13 04/19/2018 0924   CREATININE 0.65 04/19/2018 0924   CALCIUM 9.5 04/19/2018 0924   GFRNONAA 120 04/19/2018 0924   GFRAA 138 04/19/2018 0924   Lab Results  Component Value Date   HGBA1C 4.9 04/19/2018   HGBA1C 4.7 (L) 12/19/2017   HGBA1C 5.1 07/26/2017   Lab Results  Component Value Date   INSULIN 21.8 04/19/2018   INSULIN 18.0 12/19/2017   INSULIN 14.6 07/26/2017   CBC    Component Value Date/Time   WBC 8.2 06/02/2017 0945   RBC 4.78 06/02/2017 0945   HGB 14.5 06/02/2017 0945   HCT 44.3 06/02/2017 0945   PLT 323 06/02/2017 0945   MCV 93 06/02/2017 0945   MCH 30.3 06/02/2017 0945   MCHC 32.7 06/02/2017 0945   RDW 12.3 06/02/2017 0945   LYMPHSABS 2.4 06/02/2017 0945   EOSABS 0.2 06/02/2017 0945   BASOSABS 0.0 06/02/2017 0945   Iron/TIBC/Ferritin/ %Sat No results found for: IRON, TIBC, FERRITIN, IRONPCTSAT Lipid Panel     Component Value Date/Time   CHOL 225 (H) 04/19/2018 0924   TRIG 117 04/19/2018 0924   HDL 50 04/19/2018 0924   CHOLHDL 5.0 (H) 06/02/2017 0945   CHOLHDL 5.1 03/03/2014 1208   VLDL 41 (H) 03/03/2014 1208   LDLCALC 152 (H) 04/19/2018 0924   Hepatic Function Panel     Component Value Date/Time   PROT 7.0 04/19/2018 0924   ALBUMIN 4.4 04/19/2018 0924   AST 13 04/19/2018 0924   ALT 16 04/19/2018 0924   ALKPHOS 71 04/19/2018 0924   BILITOT 0.3 04/19/2018 0924   BILIDIR 0.12 06/02/2017 0945      Component Value Date/Time   TSH 1.850 07/26/2017 1127     Ref. Range 04/19/2018 09:24  Vitamin D, 25-Hydroxy Latest Ref Range: 30.0 - 100.0 ng/mL 35.4     OBESITY BEHAVIORAL INTERVENTION VISIT  Today's visit was # 24  Starting weight: 199 lb Starting date: 07/26/17 Today's weight : Weight: 185 lb (83.9 kg)  Today's date: 11/13/2018 Total lbs lost to date: 14 lbs   ASK: We discussed the diagnosis of obesity with Arva Chafeiana M Askin today and Lafonda MossesDiana agreed to give us permission to  discuss obesity behavioral modification therapy today.  ASSESS: Lafonda MossesDiana has the diagnosis of obesity and her BMI today is 32.78  Anthonette is in the action stage of change   ADVISE: Briseida was educated on the multiple health risks of obesity as well as the benefit of weight loss to improve her health. She was advised of the need for long term treatment and the importance of lifestyle modifications to improve her current health and to decrease her risk of future health problems.  AGREE: Multiple dietary modification options and treatment options were discussed and  Keryl agreed to follow the recommendations documented in the above note.  ARRANGE: Corrina was educated on the importance of frequent visits to treat obesity as outlined per CMS and USPSTF guidelines and agreed to schedule her next follow up appointment today.  Leary Roca, am acting as transcriptionist for Abby Potash, PA-C  I, Abby Potash, PA-C have reviewed above note and agree with its content

## 2018-11-14 LAB — LIPID PANEL WITH LDL/HDL RATIO
Cholesterol, Total: 221 mg/dL — ABNORMAL HIGH (ref 100–199)
HDL: 49 mg/dL (ref >39)
LDL Calculated: 137 mg/dL — ABNORMAL HIGH (ref 0–99)
LDl/HDL Ratio: 2.8 ratio (ref 0.0–3.2)
Triglycerides: 177 mg/dL — ABNORMAL HIGH (ref 0–149)
VLDL Cholesterol Cal: 35 mg/dL (ref 5–40)

## 2018-11-14 LAB — COMPREHENSIVE METABOLIC PANEL
ALT: 20 IU/L (ref 0–32)
AST: 17 IU/L (ref 0–40)
Albumin/Globulin Ratio: 1.6 (ref 1.2–2.2)
Albumin: 4.4 g/dL (ref 3.8–4.8)
Alkaline Phosphatase: 70 IU/L (ref 39–117)
BUN/Creatinine Ratio: 19 (ref 9–23)
BUN: 13 mg/dL (ref 6–20)
Bilirubin Total: 0.4 mg/dL (ref 0.0–1.2)
CO2: 19 mmol/L — ABNORMAL LOW (ref 20–29)
Calcium: 9.5 mg/dL (ref 8.7–10.2)
Chloride: 106 mmol/L (ref 96–106)
Creatinine, Ser: 0.7 mg/dL (ref 0.57–1.00)
GFR calc Af Amer: 134 mL/min/{1.73_m2} (ref >59)
GFR calc non Af Amer: 116 mL/min/{1.73_m2} (ref >59)
Globulin, Total: 2.7 g/dL (ref 1.5–4.5)
Glucose: 90 mg/dL (ref 65–99)
Potassium: 4.2 mmol/L (ref 3.5–5.2)
Sodium: 140 mmol/L (ref 134–144)
Total Protein: 7.1 g/dL (ref 6.0–8.5)

## 2018-11-14 LAB — HEMOGLOBIN A1C
Est. average glucose Bld gHb Est-mCnc: 97 mg/dL
Hgb A1c MFr Bld: 5 % (ref 4.8–5.6)

## 2018-11-14 LAB — VITAMIN D 25 HYDROXY (VIT D DEFICIENCY, FRACTURES): Vit D, 25-Hydroxy: 29.1 ng/mL — ABNORMAL LOW (ref 30.0–100.0)

## 2018-11-14 LAB — INSULIN, RANDOM: INSULIN: 14.4 u[IU]/mL (ref 2.6–24.9)

## 2018-11-15 MED FILL — TOPIRAMATE 100 MG TABLET: 100 | 90 days supply | Qty: 90 | Fill #1

## 2018-11-19 MED FILL — VIT D2 1.25 MG (50,000 UNIT: 1.25 MG | 28 days supply | Qty: 4 | Fill #0

## 2018-11-20 ENCOUNTER — Encounter (INDEPENDENT_AMBULATORY_CARE_PROVIDER_SITE_OTHER): Payer: Self-pay

## 2018-11-20 NOTE — Telephone Encounter (Signed)
Medical records placed in Dr. Constance Haw bin.

## 2018-11-20 NOTE — Telephone Encounter (Signed)
Records received on 11/14/2018 from [place]. Records are being held in the medical records drawer.    Dr. Ileana Ladd - Please let PSR know, via addendum to this encounter under the contact log tab, if you would like to review these records.    Patient has an appointment on: N/A    Data entered/Scanned records:  SCANNED: Pap: YES           Mammo: N/A                      Colonoscopy: N/A           Diabetic Eye Exam: N/A  DATA ENTERED (by lab):            INR: N/A                      LDL: N/A                      A1C: N/A  IMMUNIZATIONS: No Immunizations in records    Records to be shredded: 05/03/2019

## 2018-11-22 MED FILL — NORETHINDRONE 0.35 MG TAB: 0.35 | 84 days supply | Qty: 84 | Fill #2

## 2018-12-04 ENCOUNTER — Ambulatory Visit (INDEPENDENT_AMBULATORY_CARE_PROVIDER_SITE_OTHER): Payer: No Typology Code available for payment source | Admitting: Physician Assistant

## 2018-12-04 ENCOUNTER — Encounter (INDEPENDENT_AMBULATORY_CARE_PROVIDER_SITE_OTHER): Payer: Self-pay | Admitting: Physician Assistant

## 2018-12-04 ENCOUNTER — Other Ambulatory Visit: Payer: Self-pay

## 2018-12-04 VITALS — BP 128/87 | HR 77 | Temp 98.1°F | Ht 63.0 in | Wt 186.0 lb

## 2018-12-04 DIAGNOSIS — Z6833 Body mass index (BMI) 33.0-33.9, adult: Secondary | ICD-10-CM

## 2018-12-04 DIAGNOSIS — E669 Obesity, unspecified: Secondary | ICD-10-CM | POA: Diagnosis not present

## 2018-12-04 DIAGNOSIS — E559 Vitamin D deficiency, unspecified: Secondary | ICD-10-CM | POA: Diagnosis not present

## 2018-12-04 NOTE — Progress Notes (Signed)
Office: [phone]  /  Fax: [phone]   HPI:   Chief Complaint: OBESITY Alison Davies is here to discuss her progress with her obesity treatment plan. She is keeping a food journal with 1500 calories and 95 grams of protein and is following her eating plan approximately 10% of the time. She states she is exercising 0 minutes 0 times per week. Alison Davies reports that she has been working a lot of overtime, is only eating once daily, and is not meal planning. She will be changing jobs in the next few weeks. Her weight is 186 lb (84.4 kg) today and has had a weight gain of 1 lb since her last visit. She has lost 13 lbs since starting treatment with us.  Vitamin D deficiency Alison Davies has a diagnosis of Vitamin D deficiency. She is currently taking Vit D weekly and denies nausea, vomiting or muscle weakness.  ASSESSMENT AND PLAN:  Vitamin D deficiency  Class 1 obesity with serious comorbidity and body mass index (BMI) of 33.0 to 33.9 in adult, unspecified obesity type  PLAN:  Vitamin D Deficiency Alison Davies was informed that low Vitamin D levels contributes to fatigue and are associated with obesity, breast, and colon cancer. She agrees to continue taking Vit D and will follow-up for routine testing of Vitamin D, at least 2-3 times per year. She was informed of the risk of over-replacement of Vitamin D and agrees to not increase her dose unless she discusses this with us first. Alison Davies agrees to follow-up with our clinic in 3 weeks.  I spent > than 50% of the 25 minute visit on counseling as documented in the note.  Obesity Alison Davies is currently in the action stage of change. As such, her goal is to continue with weight loss efforts. She has agreed to keep a food journal with 1500 calories and 95 grams of protein daily. Alison Davies has been instructed to work up to a goal of 150 minutes of combined cardio and strengthening exercise per week for weight loss and overall health benefits. We discussed the following  Behavioral Modification Strategies today: work on meal planning and easy cooking plans, and keeping healthy foods in the home.  Alison Davies has agreed to follow-up with our clinic in 3 weeks. She was informed of the importance of frequent follow-up visits to maximize her success with intensive lifestyle modifications for her multiple health conditions.  I spent > than 50% of the 25 minute visit on counseling as documented in the note.    ALLERGIES: No Known Allergies  MEDICATIONS: Current Outpatient Medications on File Prior to Visit  Medication Sig Dispense Refill   ALPRAZolam (XANAX) 0.25 MG tablet Take 1 tablet (0.25 mg total) by mouth at bedtime as needed for anxiety. 30 tablet 0   Biotin 3 MG TABS Take 2 tablets by mouth daily.     Cyanocobalamin (VITAMIN B-12 CR) 1500 MCG TBCR Take 2 tablets by mouth 2 (two) times daily.     Melatonin 5 MG CHEW Chew 2 capsules by mouth at bedtime as needed.     Multiple Vitamin (MULTIVITAMIN WITH MINERALS) TABS tablet Take 1 tablet by mouth daily.     norethindrone (MICRONOR,CAMILA,ERRIN) 0.35 MG tablet Take 1 tablet (0.35 mg total) by mouth daily. 3 Package 3   sertraline (ZOLOFT) 100 MG tablet Take 1 tablet (100 mg total) by mouth daily. 90 tablet 3   SUMAtriptan (IMITREX) 25 MG tablet Take 1 tablet (25 mg total) by mouth every 2 (two) hours as needed for  migraine. May repeat in 2 hours if headache persists or recurs. 10 tablet 2   topiramate (TOPAMAX) 100 MG tablet Take 1 tablet (100 mg total) by mouth at bedtime. 90 tablet 3   vitamin C (ASCORBIC ACID) 250 MG tablet Take 250 mg by mouth daily.     Vitamin D, Ergocalciferol, (DRISDOL) 1.25 MG (50000 UT) CAPS capsule Take 1 capsule (50,000 Units total) by mouth every 7 (seven) days. 4 capsule 0   No current facility-administered medications on file prior to visit.     PAST MEDICAL HISTORY: Past Medical History:  Diagnosis Date   Anxiety    Hyperlipidemia    Leg edema    Migraines       PAST SURGICAL HISTORY: History reviewed. No pertinent surgical history.  SOCIAL HISTORY: Social History   Tobacco Use   Smoking status: Never Smoker   Smokeless tobacco: Never Used  Substance Use Topics   Alcohol use: Yes    Comment: occasional   Drug use: Never    FAMILY HISTORY: Family History  Problem Relation Age of Onset   Anxiety disorder Mother    Diabetes Father    Obesity Father    ROS: Review of Systems  Gastrointestinal: Negative for nausea and vomiting.  Musculoskeletal:       Negative for muscle weakness.   PHYSICAL EXAM: Blood pressure 128/87, pulse 77, temperature 98.1 F (36.7 C), temperature source Oral, height 5\' 3"  (1.6 m), weight 186 lb (84.4 kg), SpO2 96 %. Body mass index is 32.95 kg/m. Physical Exam Vitals signs reviewed.  Constitutional:      Appearance: Normal appearance. She is obese.  Cardiovascular:     Rate and Rhythm: Normal rate.     Pulses: Normal pulses.  Pulmonary:     Effort: Pulmonary effort is normal.     Breath sounds: Normal breath sounds.  Musculoskeletal: Normal range of motion.  Skin:    General: Skin is warm and dry.  Neurological:     Mental Status: She is alert and oriented to person, place, and time.  Psychiatric:        Behavior: Behavior normal.   RECENT LABS AND TESTS: BMET    Component Value Date/Time   NA 140 11/13/2018 1337   K 4.2 11/13/2018 1337   CL 106 11/13/2018 1337   CO2 19 (L) 11/13/2018 1337   GLUCOSE 90 11/13/2018 1337   GLUCOSE 84 03/03/2014 1208   BUN 13 11/13/2018 1337   CREATININE 0.70 11/13/2018 1337   CALCIUM 9.5 11/13/2018 1337   GFRNONAA 116 11/13/2018 1337   GFRAA 134 11/13/2018 1337   Lab Results  Component Value Date   HGBA1C 5.0 11/13/2018   HGBA1C 4.9 04/19/2018   HGBA1C 4.7 (L) 12/19/2017   HGBA1C 5.1 07/26/2017   Lab Results  Component Value Date   INSULIN 14.4 11/13/2018   INSULIN 21.8 04/19/2018   INSULIN 18.0 12/19/2017   INSULIN 14.6 07/26/2017    CBC    Component Value Date/Time   WBC 8.2 06/02/2017 0945   RBC 4.78 06/02/2017 0945   HGB 14.5 06/02/2017 0945   HCT 44.3 06/02/2017 0945   PLT 323 06/02/2017 0945   MCV 93 06/02/2017 0945   MCH 30.3 06/02/2017 0945   MCHC 32.7 06/02/2017 0945   RDW 12.3 06/02/2017 0945   LYMPHSABS 2.4 06/02/2017 0945   EOSABS 0.2 06/02/2017 0945   BASOSABS 0.0 06/02/2017 0945   Iron/TIBC/Ferritin/ %Sat No results found for: IRON, TIBC, FERRITIN, IRONPCTSAT Lipid Panel  Component Value Date/Time   CHOL 221 (H) 11/13/2018 1337   TRIG 177 (H) 11/13/2018 1337   HDL 49 11/13/2018 1337   CHOLHDL 5.0 (H) 06/02/2017 0945   CHOLHDL 5.1 03/03/2014 1208   VLDL 41 (H) 03/03/2014 1208   LDLCALC 137 (H) 11/13/2018 1337   Hepatic Function Panel     Component Value Date/Time   PROT 7.1 11/13/2018 1337   ALBUMIN 4.4 11/13/2018 1337   AST 17 11/13/2018 1337   ALT 20 11/13/2018 1337   ALKPHOS 70 11/13/2018 1337   BILITOT 0.4 11/13/2018 1337   BILIDIR 0.12 06/02/2017 0945      Component Value Date/Time   TSH 1.850 07/26/2017 1127   Results for Arva ChafeGARCIA, Mechille M (MRN [id number]) as of 12/04/2018 10:34  Ref. Range 11/13/2018 13:37  Vitamin D, 25-Hydroxy Latest Ref Range: 30.0 - 100.0 ng/mL 29.1 (L)   OBESITY BEHAVIORAL INTERVENTION VISIT  Today's visit was #25  Starting weight: 199 lbs Starting date: 07/26/2017 Today's weight: 186 lbs  Today's date: 12/04/2018 Total lbs lost to date: 13   12/04/2018  Height 5\' 3"  (1.6 m)  Weight 186 lb (84.4 kg)  BMI (Calculated) 32.96  BLOOD PRESSURE - SYSTOLIC 128  BLOOD PRESSURE - DIASTOLIC 87   Body Fat % 43.4 %  Total Body Water (lbs) 74 lbs   ASK: We discussed the diagnosis of obesity with Arva Chafeiana M Giarrusso today and Alison Davies agreed to give us permission to discuss obesity behavioral modification therapy today.  ASSESS: Alison Davies has the diagnosis of obesity and her BMI today is 33.1. Alison Davies is in the action stage of change.   ADVISE: Alison Davies was  educated on the multiple health risks of obesity as well as the benefit of weight loss to improve her health. She was advised of the need for long term treatment and the importance of lifestyle modifications to improve her current health and to decrease her risk of future health problems.  AGREE: Multiple dietary modification options and treatment options were discussed and  Alison Davies agreed to follow the recommendations documented in the above note.  ARRANGE: Alison Davies was educated on the importance of frequent visits to treat obesity as outlined per CMS and USPSTF guidelines and agreed to schedule her next follow up appointment today.  Fernanda DrumI, Denise Haag, am acting as transcriptionist for Alois Clicheracey Aguilar, PA-C I, Alois Clicheracey Aguilar, PA-C have reviewed above note and agree with its content

## 2018-12-13 ENCOUNTER — Telehealth: Payer: No Typology Code available for payment source | Admitting: Family Medicine

## 2018-12-13 ENCOUNTER — Encounter: Payer: Self-pay | Admitting: Family Medicine

## 2018-12-13 ENCOUNTER — Ambulatory Visit: Payer: Self-pay | Admitting: *Deleted

## 2018-12-13 ENCOUNTER — Telehealth (INDEPENDENT_AMBULATORY_CARE_PROVIDER_SITE_OTHER): Payer: No Typology Code available for payment source | Admitting: Family Medicine

## 2018-12-13 DIAGNOSIS — R6889 Other general symptoms and signs: Secondary | ICD-10-CM

## 2018-12-13 DIAGNOSIS — Z20822 Contact with and (suspected) exposure to covid-19: Secondary | ICD-10-CM

## 2018-12-13 MED ORDER — ALBUTEROL SULFATE HFA 108 (90 BASE) MCG/ACT IN AERS: 2.0000 | INHALATION_SPRAY | Freq: Four times a day (QID) | RESPIRATORY_TRACT | 0 refills | Status: DC | PRN

## 2018-12-13 MED FILL — VIT D2 1.25 MG (50,000 UNIT: 1.25 MG | 28 days supply | Qty: 4 | Fill #0

## 2018-12-13 MED FILL — ALBUTEROL SULFATE HFA 108 (: 108 (90 BAS | 25 days supply | Qty: 18 | Fill #0

## 2018-12-13 NOTE — Telephone Encounter (Signed)
Employee at [facility] [employer] location calls:fatigue, feeling like she can't get a deep breath while walking.Woke up with these symptoms this morning. Denies any fever. Patient to do HAW survey and call for further instructions for testing. Virtual appointment being set up for her for today. Encouraged fluids for hydration/tylenol for discomfort/rest. Do not go to work today. Stay home. HAW will follow. Seek immediate evaluation with breathing difficulties.Stated she understood. [facility] reaching out to patient with virtual.   Reason for Disposition . [1] MILD difficulty breathing (e.g., minimal/no SOB at rest, SOB with walking, pulse <100) AND [2] NEW-onset or WORSE than normal  Answer Assessment - Initial Assessment Questions 1. RESPIRATORY STATUS: "Describe your breathing?" (e.g., wheezing, shortness of breath, unable to speak, severe coughing)      Shortness of breath with activity 2. ONSET: "When did this breathing problem begin?"      Woke up with this morning 3. PATTERN "Does the difficult breathing come and go, or has it been constant since it started?"      4. SEVERITY: "How bad is your breathing?" (e.g., mild, moderate, severe)    - MILD: No SOB at rest, mild SOB with walking, speaks normally in sentences, can lay down, no retractions, pulse < 100. MILD   - MODERATE: SOB at rest, SOB with minimal exertion and prefers to sit, cannot lie down flat, speaks in phrases, mild retractions, audible wheezing, pulse 100-120.    - SEVERE: Very SOB at rest, speaks in single words, struggling to breathe, sitting hunched forward, retractions, pulse > 120      5. RECURRENT SYMPTOM: "Have you had difficulty breathing before?" If so, ask: "When was the last time?" and "What happened that time?"      No 6. CARDIAC HISTORY: "Do you have any history of heart disease?" (e.g., heart attack, angina, bypass surgery, angioplasty)     no 7. LUNG HISTORY: "Do you have any history of lung disease?"  (e.g., pulmonary  embolus, asthma, emphysema)     no 8. CAUSE: "What do you think is causing the breathing problem?"      Possible covid 9. OTHER SYMPTOMS: "Do you have any other symptoms? (e.g., dizziness, runny nose, cough, chest pain, fever)     Body aches, can't get a deep breath. 10. PREGNANCY: "Is there any chance you are pregnant?" "When was your last menstrual period?" no        11. TRAVEL: "Have you traveled out of the country in the last month?" (e.g., travel history, exposures)       no  Protocols used: BREATHING DIFFICULTY-A-AH

## 2018-12-13 NOTE — Progress Notes (Signed)
Virtual Visit via Video Note  I connected with Alison Davies on 12/13/18 at  4:00 PM EDT by a video enabled telemedicine application and verified that I am speaking with the correct person using two identifiers. Location patient: home Location provider: work  Persons participating in the virtual visit: patient, provider  I discussed the limitations of evaluation and management by telemedicine and the availability of in person appointments. The patient expressed understanding and agreed to proceed.  Chief Complaint  Patient presents with  . Fatigue    fatigue, difficulty breathing when walking, mild chest pain/1 day     HPI: Alison Davies is a 33 y.o. female complains of 1 week h/o fatigue despite sleeping 8-10hrs/night, tightness in the center or her chest and feeling like she can't take a full deep breath. No respiratory distress. She does note feeling more out of breath than normal with minimal exertion like going up 1 flight of stairs. No fever, chills, body aches, cough, runny nose, sore throat. She states she has had some issues with BM the past 2-3 weeks but she attributes this to "eating like crap" and "not drinking enough water". 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 Pt called [facility] today and was recommended to go get tested for COVID-19 which pt did.    Past Medical History:  Diagnosis Date  . Anxiety   . Hyperlipidemia   . Leg edema   . Migraines     No past surgical history on file.  Family History  Problem Relation Age of Onset  . Anxiety disorder Mother   . Diabetes Father   . Obesity Father     Social History   Tobacco Use  . Smoking status: Never Smoker  . Smokeless tobacco: Never Used  Substance Use Topics  . Alcohol use: Yes    Comment: occasional  . Drug use: Never     Current Outpatient Medications:  .  ALPRAZolam (XANAX) 0.25 MG tablet, Take 1 tablet (0.25 mg total) by mouth at bedtime as  needed for anxiety., Disp: 30 tablet, Rfl: 0 .  Biotin 3 MG TABS, Take 2 tablets by mouth daily., Disp: , Rfl:  .  Cyanocobalamin (VITAMIN B-12 CR) 1500 MCG TBCR, Take 2 tablets by mouth 2 (two) times daily., Disp: , Rfl:  .  Melatonin 5 MG CHEW, Chew 2 capsules by mouth at bedtime as needed., Disp: , Rfl:  .  Multiple Vitamin (MULTIVITAMIN WITH MINERALS) TABS tablet, Take 1 tablet by mouth daily., Disp: , Rfl:  .  norethindrone (MICRONOR,CAMILA,ERRIN) 0.35 MG tablet, Take 1 tablet (0.35 mg total) by mouth daily., Disp: 3 Package, Rfl: 3 .  sertraline (ZOLOFT) 100 MG tablet, Take 1 tablet (100 mg total) by mouth daily., Disp: 90 tablet, Rfl: 3 .  SUMAtriptan (IMITREX) 25 MG tablet, Take 1 tablet (25 mg total) by mouth every 2 (two) hours as needed for migraine. May repeat in 2 hours if headache persists or recurs., Disp: 10 tablet, Rfl: 2 .  topiramate (TOPAMAX) 100 MG tablet, Take 1 tablet (100 mg total) by mouth at bedtime., Disp: 90 tablet, Rfl: 3 .  vitamin C (ASCORBIC ACID) 250 MG tablet, Take 250 mg by mouth daily., Disp: , Rfl:  .  Vitamin D, Ergocalciferol, (DRISDOL) 1.25 MG (50000 UT) CAPS capsule, Take 1 capsule (50,000 Units total) by mouth every 7 (seven) days., Disp: 4 capsule, Rfl: 0 .  albuterol (VENTOLIN HFA) 108 (  90 Base) MCG/ACT inhaler, Inhale 2 puffs into the lungs every 6 (six) hours as needed for wheezing or shortness of breath., Disp: 18 g, Rfl: 0  No Known Allergies    ROS: See pertinent positives and negatives per HPI.   EXAM:  VITALS per patient if applicable: There were no vitals taken for this visit.   GENERAL: alert, oriented, appears well and in no acute distress  HEENT: atraumatic, conjunctiva clear, no obvious abnormalities on inspection of external nose and ears  NECK: normal movements of the head and neck  LUNGS: on inspection no signs of respiratory distress, breathing rate appears normal, no obvious gross SOB, gasping or wheezing, no conversational  dyspnea  CV: no obvious cyanosis  MS: moves all visible extremities without noticeable abnormality  PSYCH/NEURO: pleasant and cooperative, speech and thought processing grossly intact   ASSESSMENT AND PLAN:  1. Suspected Covid-19 Virus Infection - pt tested this afternoon and now self-quarantined - COVID-19 home monitoring ordered - cont supportive care to include increased fluids/water, tylenol as needed, rest Rx: - albuterol (VENTOLIN HFA) 108 (90 Base) MCG/ACT inhaler; Inhale 2 puffs into the lungs every 6 (six) hours as needed for wheezing or shortness of breath.  Dispense: 18 g; Refill: 0 - f/u if symptoms worsen, await test result    I discussed the assessment and treatment plan with the patient. The patient was provided an opportunity to ask questions and all were answered. The patient agreed with the plan and demonstrated an understanding of the instructions.   The patient was advised to call back or seek an in-person evaluation if the symptoms worsen or if the condition fails to improve as anticipated.   Letta Median, DO

## 2018-12-14 ENCOUNTER — Encounter (INDEPENDENT_AMBULATORY_CARE_PROVIDER_SITE_OTHER): Payer: Self-pay

## 2018-12-16 ENCOUNTER — Encounter (INDEPENDENT_AMBULATORY_CARE_PROVIDER_SITE_OTHER): Payer: Self-pay

## 2018-12-17 ENCOUNTER — Encounter (INDEPENDENT_AMBULATORY_CARE_PROVIDER_SITE_OTHER): Payer: Self-pay

## 2018-12-18 ENCOUNTER — Encounter (INDEPENDENT_AMBULATORY_CARE_PROVIDER_SITE_OTHER): Payer: Self-pay

## 2018-12-25 ENCOUNTER — Ambulatory Visit (INDEPENDENT_AMBULATORY_CARE_PROVIDER_SITE_OTHER): Payer: No Typology Code available for payment source | Admitting: Physician Assistant

## 2019-01-03 ENCOUNTER — Ambulatory Visit (INDEPENDENT_AMBULATORY_CARE_PROVIDER_SITE_OTHER): Payer: No Typology Code available for payment source | Admitting: Physician Assistant

## 2019-01-03 ENCOUNTER — Encounter (INDEPENDENT_AMBULATORY_CARE_PROVIDER_SITE_OTHER): Payer: Self-pay | Admitting: Physician Assistant

## 2019-01-03 ENCOUNTER — Other Ambulatory Visit: Payer: Self-pay

## 2019-01-03 VITALS — BP 110/71 | HR 80 | Temp 97.9°F | Ht 63.0 in | Wt 186.0 lb

## 2019-01-03 DIAGNOSIS — Z9189 Other specified personal risk factors, not elsewhere classified: Secondary | ICD-10-CM

## 2019-01-03 DIAGNOSIS — Z6833 Body mass index (BMI) 33.0-33.9, adult: Secondary | ICD-10-CM

## 2019-01-03 DIAGNOSIS — E559 Vitamin D deficiency, unspecified: Secondary | ICD-10-CM | POA: Diagnosis not present

## 2019-01-03 DIAGNOSIS — E669 Obesity, unspecified: Secondary | ICD-10-CM | POA: Diagnosis not present

## 2019-01-03 DIAGNOSIS — E66811 Obesity, class 1: Secondary | ICD-10-CM

## 2019-01-03 DIAGNOSIS — E7849 Other hyperlipidemia: Secondary | ICD-10-CM | POA: Diagnosis not present

## 2019-01-04 MED FILL — VIT D2 1.25 MG (50,000 UNIT: 1.25 MG | 28 days supply | Qty: 4 | Fill #0

## 2019-01-08 NOTE — Progress Notes (Signed)
Office: [phone]  /  Fax: [phone]   HPI:   Chief Complaint: OBESITY Cadi is here to discuss her progress with her obesity treatment plan. She is on the keep a food journal with 1500 calories and 95 grams of protein daily plan and is following her eating plan approximately 50 % of the time. She states she is exercising 0 minutes 0 times per week. Lakisha just started a new job at [employer] this week and she is creating her new routine. Dearia would like to start exercising. Her weight is 186 lb (84.4 kg) today and she has maintained weight since her last visit. She has lost 13 lbs since starting treatment with [name].  Vitamin D deficiency Teiana has a diagnosis of vitamin D deficiency. She is currently taking vit D and denies nausea, vomiting or muscle weakness.  Hyperlipidemia Orlanda has hyperlipidemia and she is not on medications. She has been trying to improve her cholesterol levels with intensive lifestyle modification including a low saturated fat diet, exercise and weight loss. She denies any chest pain.  At risk for cardiovascular disease Addysen is at a higher than average risk for cardiovascular disease due to obesity and hyperlipidemia. She currently denies any chest pain.  ASSESSMENT AND PLAN:  Vitamin D deficiency - Plan: Vitamin D, Ergocalciferol, (DRISDOL) 1.25 MG (50000 UT) CAPS capsule  Other hyperlipidemia  At risk for heart disease  Class 1 obesity with serious comorbidity and body mass index (BMI) of 33.0 to 33.9 in adult, unspecified obesity type  PLAN:  Vitamin D Deficiency Laysa was informed that low vitamin D levels contributes to fatigue and are associated with obesity, breast, and colon cancer. She agrees to continue to take prescription Vit D @50 ,000 IU every week #4 with no refills and will follow up for routine testing of vitamin D, at least 2-3 times per year. She was informed of the risk of over-replacement of vitamin D and agrees to not increase her  dose unless she discusses this with [name] first. Lianna agrees to follow up as directed.  Hyperlipidemia Maxine was informed of the American Heart Association Guidelines emphasizing intensive lifestyle modifications as the first line treatment for hyperlipidemia. We discussed many lifestyle modifications today in depth, and Alanny will continue to work on decreasing saturated fats such as fatty red meat, butter and many fried foods. She will also increase vegetables and lean protein in her diet and continue to work on exercise and weight loss efforts.  Cardiovascular risk counseling Tiffani was given extended (15 minutes) coronary artery disease prevention counseling today. She is 33 y.o. female and has risk factors for heart disease including obesity and hyperlipidemia. We discussed intensive lifestyle modifications today with an emphasis on specific weight loss instructions and strategies. Pt was also informed of the importance of increasing exercise and decreasing saturated fats to help prevent heart disease.  Obesity Cherelle is currently in the action stage of change. As such, her goal is to continue with weight loss efforts She has agreed to keep a food journal with 1500 calories and 95 grams of protein daily  Shaelee has been instructed to work up to a goal of 150 minutes of combined cardio and strengthening exercise per week for weight loss and overall health benefits. We discussed the following Behavioral Modification Strategies today: planning for success and work on meal planning and easy cooking plans  Tamekia has agreed to follow up with our clinic in 3 weeks. She was informed of the importance of  frequent follow up visits to maximize her success with intensive lifestyle modifications for her multiple health conditions.  ALLERGIES: No Known Allergies  MEDICATIONS: Current Outpatient Medications on File Prior to Visit  Medication Sig Dispense Refill   albuterol (VENTOLIN HFA) 108 (90 Base)  MCG/ACT inhaler Inhale 2 puffs into the lungs every 6 (six) hours as needed for wheezing or shortness of breath. 18 g 0   ALPRAZolam (XANAX) 0.25 MG tablet Take 1 tablet (0.25 mg total) by mouth at bedtime as needed for anxiety. 30 tablet 0   Biotin 3 MG TABS Take 2 tablets by mouth daily.     Cyanocobalamin (VITAMIN B-12 CR) 1500 MCG TBCR Take 2 tablets by mouth 2 (two) times daily.     Melatonin 5 MG CHEW Chew 2 capsules by mouth at bedtime as needed.     Multiple Vitamin (MULTIVITAMIN WITH MINERALS) TABS tablet Take 1 tablet by mouth daily.     norethindrone (MICRONOR,CAMILA,ERRIN) 0.35 MG tablet Take 1 tablet (0.35 mg total) by mouth daily. 3 Package 3   sertraline (ZOLOFT) 100 MG tablet Take 1 tablet (100 mg total) by mouth daily. 90 tablet 3   SUMAtriptan (IMITREX) 25 MG tablet Take 1 tablet (25 mg total) by mouth every 2 (two) hours as needed for migraine. May repeat in 2 hours if headache persists or recurs. 10 tablet 2   topiramate (TOPAMAX) 100 MG tablet Take 1 tablet (100 mg total) by mouth at bedtime. 90 tablet 3   vitamin C (ASCORBIC ACID) 250 MG tablet Take 250 mg by mouth daily.     Vitamin D, Ergocalciferol, (DRISDOL) 1.25 MG (50000 UT) CAPS capsule Take 1 capsule (50,000 Units total) by mouth every 7 (seven) days. 4 capsule 0   No current facility-administered medications on file prior to visit.     PAST MEDICAL HISTORY: Past Medical History:  Diagnosis Date   Anxiety    Hyperlipidemia    Leg edema    Migraines     PAST SURGICAL HISTORY: History reviewed. No pertinent surgical history.  SOCIAL HISTORY: Social History   Tobacco Use   Smoking status: Never Smoker   Smokeless tobacco: Never Used  Substance Use Topics   Alcohol use: Yes    Comment: occasional   Drug use: Never    FAMILY HISTORY: Family History  Problem Relation Age of Onset   Anxiety disorder Mother    Diabetes Father    Obesity Father     ROS: Review of Systems    Constitutional: Negative for weight loss.  Cardiovascular: Negative for chest pain.  Gastrointestinal: Negative for nausea and vomiting.  Musculoskeletal:       Negative for muscle weakness    PHYSICAL EXAM: Blood pressure 110/71, pulse 80, temperature 97.9 F (36.6 C), temperature source Oral, height 5\' 3"  (1.6 m), weight 186 lb (84.4 kg), SpO2 97 %. Body mass index is 32.95 kg/m. Physical Exam Vitals signs reviewed.  Constitutional:      Appearance: Normal appearance. She is well-developed. She is obese.  Cardiovascular:     Rate and Rhythm: Normal rate.  Pulmonary:     Effort: Pulmonary effort is normal.  Musculoskeletal: Normal range of motion.  Skin:    General: Skin is warm and dry.  Neurological:     Mental Status: She is alert and oriented to person, place, and time.  Psychiatric:        Mood and Affect: Mood normal.        Behavior: Behavior normal.  RECENT LABS AND TESTS: BMET    Component Value Date/Time   NA 140 11/13/2018 1337   K 4.2 11/13/2018 1337   CL 106 11/13/2018 1337   CO2 19 (L) 11/13/2018 1337   GLUCOSE 90 11/13/2018 1337   GLUCOSE 84 03/03/2014 1208   BUN 13 11/13/2018 1337   CREATININE 0.70 11/13/2018 1337   CALCIUM 9.5 11/13/2018 1337   GFRNONAA 116 11/13/2018 1337   GFRAA 134 11/13/2018 1337   Lab Results  Component Value Date   HGBA1C 5.0 11/13/2018   HGBA1C 4.9 04/19/2018   HGBA1C 4.7 (L) 12/19/2017   HGBA1C 5.1 07/26/2017   Lab Results  Component Value Date   INSULIN 14.4 11/13/2018   INSULIN 21.8 04/19/2018   INSULIN 18.0 12/19/2017   INSULIN 14.6 07/26/2017   CBC    Component Value Date/Time   WBC 8.2 06/02/2017 0945   RBC 4.78 06/02/2017 0945   HGB 14.5 06/02/2017 0945   HCT 44.3 06/02/2017 0945   PLT 323 06/02/2017 0945   MCV 93 06/02/2017 0945   MCH 30.3 06/02/2017 0945   MCHC 32.7 06/02/2017 0945   RDW 12.3 06/02/2017 0945   LYMPHSABS 2.4 06/02/2017 0945   EOSABS 0.2 06/02/2017 0945   BASOSABS 0.0  06/02/2017 0945   Iron/TIBC/Ferritin/ %Sat No results found for: IRON, TIBC, FERRITIN, IRONPCTSAT Lipid Panel     Component Value Date/Time   CHOL 221 (H) 11/13/2018 1337   TRIG 177 (H) 11/13/2018 1337   HDL 49 11/13/2018 1337   CHOLHDL 5.0 (H) 06/02/2017 0945   CHOLHDL 5.1 03/03/2014 1208   VLDL 41 (H) 03/03/2014 1208   LDLCALC 137 (H) 11/13/2018 1337   Hepatic Function Panel     Component Value Date/Time   PROT 7.1 11/13/2018 1337   ALBUMIN 4.4 11/13/2018 1337   AST 17 11/13/2018 1337   ALT 20 11/13/2018 1337   ALKPHOS 70 11/13/2018 1337   BILITOT 0.4 11/13/2018 1337   BILIDIR 0.12 06/02/2017 0945      Component Value Date/Time   TSH 1.850 07/26/2017 1127     Ref. Range 11/13/2018 13:37  Vitamin D, 25-Hydroxy Latest Ref Range: 30.0 - 100.0 ng/mL 29.1 (L)    OBESITY BEHAVIORAL INTERVENTION VISIT  Today's visit was # 26   Starting weight: 199 lbs Starting date: 07/26/2017 Today's weight : 186 lbs Today's date: 01/03/2019 Total lbs lost to date: 13    01/03/2019  Height 5\' 3"  (1.6 m)  Weight 186 lb (84.4 kg)  BMI (Calculated) 32.96  BLOOD PRESSURE - SYSTOLIC 110  BLOOD PRESSURE - DIASTOLIC 71   Body Fat % 40.4 %  Total Body Water (lbs) 74 lbs    ASK: We discussed the diagnosis of obesity with Arva Chafeiana M Widjaja today and Lafonda MossesDiana agreed to give us permission to discuss obesity behavioral modification therapy today.  ASSESS: Lafonda MossesDiana has the diagnosis of obesity and her BMI today is 32.96 Lafonda MossesDiana is in the action stage of change   ADVISE: Lafonda MossesDiana was educated on the multiple health risks of obesity as well as the benefit of weight loss to improve her health. She was advised of the need for long term treatment and the importance of lifestyle modifications to improve her current health and to decrease her risk of future health problems.  AGREE: Multiple dietary modification options and treatment options were discussed and  Lafonda MossesDiana agreed to follow the recommendations  documented in the above note.  ARRANGE: Lafonda MossesDiana was educated on the importance of frequent visits to treat  obesity as outlined per CMS and USPSTF guidelines and agreed to schedule her next follow up appointment today.  Corey Skains, am acting as transcriptionist for Abby Potash, PA-C I, Abby Potash, PA-C have reviewed above note and agree with its content

## 2019-01-09 MED ORDER — VITAMIN D (ERGOCALCIFEROL) 1.25 MG (50000 UNIT) PO CAPS: 50000.0000 [IU] | ORAL_CAPSULE | ORAL | 0 refills | Status: DC

## 2019-01-24 ENCOUNTER — Other Ambulatory Visit: Payer: Self-pay

## 2019-01-24 ENCOUNTER — Ambulatory Visit (INDEPENDENT_AMBULATORY_CARE_PROVIDER_SITE_OTHER): Payer: No Typology Code available for payment source | Admitting: Physician Assistant

## 2019-01-24 VITALS — BP 118/81 | HR 68 | Temp 98.4°F | Ht 63.0 in | Wt 186.0 lb

## 2019-01-24 DIAGNOSIS — E559 Vitamin D deficiency, unspecified: Secondary | ICD-10-CM

## 2019-01-24 DIAGNOSIS — Z6832 Body mass index (BMI) 32.0-32.9, adult: Secondary | ICD-10-CM

## 2019-01-24 DIAGNOSIS — Z9189 Other specified personal risk factors, not elsewhere classified: Secondary | ICD-10-CM

## 2019-01-24 DIAGNOSIS — E7849 Other hyperlipidemia: Secondary | ICD-10-CM | POA: Diagnosis not present

## 2019-01-24 DIAGNOSIS — E669 Obesity, unspecified: Secondary | ICD-10-CM

## 2019-01-24 MED ORDER — VITAMIN D (ERGOCALCIFEROL) 1.25 MG (50000 UNIT) PO CAPS: 50000.0000 [IU] | ORAL_CAPSULE | ORAL | 0 refills | Status: DC

## 2019-01-25 MED FILL — SERTRALINE HCL 100 MG TABS: 100 | 90 days supply | Qty: 90 | Fill #0

## 2019-01-29 MED FILL — VIT D2 1.25 MG (50,000 UNIT: 1.25 MG | 28 days supply | Qty: 4 | Fill #0

## 2019-01-29 NOTE — Progress Notes (Signed)
Office: [phone]  /  Fax: [phone]   HPI:   Chief Complaint: OBESITY Sheryn is here to discuss her progress with her obesity treatment plan. She is on the  keep a food journal with 1500 calories and 95g of protein daily and is following her eating plan approximately 25 % of the time. She states she is exercising 0 minutes 0 times per week. Deonna reports that she has been off the plan due to working a lot of extra days.  Her weight is 186 lb (84.4 kg) today and has not lost weight since her last visit. She has lost 13 lbs since starting treatment with [name].  Vitamin D deficiency Tiane has a diagnosis of vitamin D deficiency. She is currently taking vit D and denies nausea, vomiting or muscle weakness.  At risk for osteopenia and osteoporosis Miri is at higher risk of osteopenia and osteoporosis due to vitamin D deficiency.   Hyperlipidemia Dariona has hyperlipidemia and has been trying to improve her cholesterol levels with intensive lifestyle modification including a low saturated fat diet, exercise and weight loss. She denies any chest pain, claudication or myalgias. She is not currently on any medications.   ASSESSMENT AND PLAN:  Vitamin D deficiency - Plan: Vitamin D, Ergocalciferol, (DRISDOL) 1.25 MG (50000 UT) CAPS capsule  Other hyperlipidemia  At risk for osteoporosis  Class 1 obesity with serious comorbidity and body mass index (BMI) of 32.0 to 32.9 in adult, unspecified obesity type  PLAN: Vitamin D Deficiency Becca was informed that low vitamin D levels contributes to fatigue and are associated with obesity, breast, and colon cancer. She agrees to continue to take prescription Vit D @50 ,000 IU every week #4 with no refills and will follow up for routine testing of vitamin D, at least 2-3 times per year. She was informed of the risk of over-replacement of vitamin D and agrees to not increase her dose unless she discusses this with [name] first. Agrees to follow up with our  clinic as directed.   At risk for osteopenia and osteoporosis Olivene was given extended  (15 minutes) osteoporosis prevention counseling today. Raia is at risk for osteopenia and osteoporosis due to her vitamin D deficiency. She was encouraged to take her vitamin D and follow her higher calcium diet and increase strengthening exercise to help strengthen her bones and decrease her risk of osteopenia and osteoporosis.  Hyperlipidemia Sheronica was informed of the American Heart Association Guidelines emphasizing intensive lifestyle modifications as the first line treatment for hyperlipidemia. We discussed many lifestyle modifications today in depth, and Hennie will continue to work on decreasing saturated fats such as fatty red meat, butter and many fried foods. She will also increase vegetables and lean protein in her diet and continue to work on exercise and weight loss efforts.  Obesity Jamani is currently in the action stage of change. As such, her goal is to continue with weight loss efforts She has agreed to change her plan and start to follow the Category 3 plan Deaven has been instructed to work up to a goal of 150 minutes of combined cardio and strengthening exercise per week for weight loss and overall health benefits. We discussed the following Behavioral Modification Strategies today: work on meal planning and easy cooking plans and keeping healthy foods in the home.    Shaleka has agreed to follow up with our clinic in 3 weeks. She was informed of the importance of frequent follow up visits to maximize her success with  intensive lifestyle modifications for her multiple health conditions.  ALLERGIES: No Known Allergies  MEDICATIONS: Current Outpatient Medications on File Prior to Visit  Medication Sig Dispense Refill  . albuterol (VENTOLIN HFA) 108 (90 Base) MCG/ACT inhaler Inhale 2 puffs into the lungs every 6 (six) hours as needed for wheezing or shortness of breath. 18 g 0  . ALPRAZolam  (XANAX) 0.25 MG tablet Take 1 tablet (0.25 mg total) by mouth at bedtime as needed for anxiety. 30 tablet 0  . Biotin 3 MG TABS Take 2 tablets by mouth daily.    . Cyanocobalamin (VITAMIN B-12 CR) 1500 MCG TBCR Take 2 tablets by mouth 2 (two) times daily.    . Melatonin 5 MG CHEW Chew 2 capsules by mouth at bedtime as needed.    . Multiple Vitamin (MULTIVITAMIN WITH MINERALS) TABS tablet Take 1 tablet by mouth daily.    . norethindrone (MICRONOR,CAMILA,ERRIN) 0.35 MG tablet Take 1 tablet (0.35 mg total) by mouth daily. 3 Package 3  . sertraline (ZOLOFT) 100 MG tablet Take 1 tablet (100 mg total) by mouth daily. 90 tablet 3  . SUMAtriptan (IMITREX) 25 MG tablet Take 1 tablet (25 mg total) by mouth every 2 (two) hours as needed for migraine. May repeat in 2 hours if headache persists or recurs. 10 tablet 2  . topiramate (TOPAMAX) 100 MG tablet Take 1 tablet (100 mg total) by mouth at bedtime. 90 tablet 3  . vitamin C (ASCORBIC ACID) 250 MG tablet Take 250 mg by mouth daily.     No current facility-administered medications on file prior to visit.     PAST MEDICAL HISTORY: Past Medical History:  Diagnosis Date  . Anxiety   . Hyperlipidemia   . Leg edema   . Migraines     PAST SURGICAL HISTORY: No past surgical history on file.  SOCIAL HISTORY: Social History   Tobacco Use  . Smoking status: Never Smoker  . Smokeless tobacco: Never Used  Substance Use Topics  . Alcohol use: Yes    Comment: occasional  . Drug use: Never    FAMILY HISTORY: Family History  Problem Relation Age of Onset  . Anxiety disorder Mother   . Diabetes Father   . Obesity Father     ROS: Review of Systems  Constitutional: Negative for weight loss.  Cardiovascular: Negative for chest pain and claudication.  Gastrointestinal: Negative for nausea and vomiting.  Musculoskeletal: Negative for myalgias.       Negative for muscle weakness    PHYSICAL EXAM: Blood pressure 118/81, pulse 68, temperature  98.4 F (36.9 C), temperature source Oral, height  (1.6 m), weight 186 lb (84.4 kg), SpO2 98 %. Body mass index is 32.95 kg/m. Physical Exam Vitals signs reviewed.  Constitutional:      Appearance: Normal appearance. She is obese.  HENT:     Head: Normocephalic.     Nose: Nose normal.  Neck:     Musculoskeletal: Normal range of motion.  Cardiovascular:     Rate and Rhythm: Normal rate.  Pulmonary:     Effort: Pulmonary effort is normal.  Musculoskeletal: Normal range of motion.  Skin:    General: Skin is warm and dry.  Neurological:     Mental Status: She is alert and oriented to person, place, and time.  Psychiatric:        Mood and Affect: Mood normal.        Behavior: Behavior normal.     RECENT LABS AND TESTS: BMET  Component Value Date/Time   NA 140 11/13/2018 1337   K 4.2 11/13/2018 1337   CL 106 11/13/2018 1337   CO2 19 (L) 11/13/2018 1337   GLUCOSE 90 11/13/2018 1337   GLUCOSE 84 03/03/2014 1208   BUN 13 11/13/2018 1337   CREATININE 0.70 11/13/2018 1337   CALCIUM 9.5 11/13/2018 1337   GFRNONAA 116 11/13/2018 1337   GFRAA 134 11/13/2018 1337   Lab Results  Component Value Date   HGBA1C 5.0 11/13/2018   HGBA1C 4.9 04/19/2018   HGBA1C 4.7 (L) 12/19/2017   HGBA1C 5.1 07/26/2017   Lab Results  Component Value Date   INSULIN 14.4 11/13/2018   INSULIN 21.8 04/19/2018   INSULIN 18.0 12/19/2017   INSULIN 14.6 07/26/2017   CBC    Component Value Date/Time   WBC 8.2 06/02/2017 0945   RBC 4.78 06/02/2017 0945   HGB 14.5 06/02/2017 0945   HCT 44.3 06/02/2017 0945   PLT 323 06/02/2017 0945   MCV 93 06/02/2017 0945   MCH 30.3 06/02/2017 0945   MCHC 32.7 06/02/2017 0945   RDW 12.3 06/02/2017 0945   LYMPHSABS 2.4 06/02/2017 0945   EOSABS 0.2 06/02/2017 0945   BASOSABS 0.0 06/02/2017 0945   Iron/TIBC/Ferritin/ %Sat No results found for: IRON, TIBC, FERRITIN, IRONPCTSAT Lipid Panel     Component Value Date/Time   CHOL 221 (H) 11/13/2018 1337    TRIG 177 (H) 11/13/2018 1337   HDL 49 11/13/2018 1337   CHOLHDL 5.0 (H) 06/02/2017 0945   CHOLHDL 5.1 03/03/2014 1208   VLDL 41 (H) 03/03/2014 1208   LDLCALC 137 (H) 11/13/2018 1337   Hepatic Function Panel     Component Value Date/Time   PROT 7.1 11/13/2018 1337   ALBUMIN 4.4 11/13/2018 1337   AST 17 11/13/2018 1337   ALT 20 11/13/2018 1337   ALKPHOS 70 11/13/2018 1337   BILITOT 0.4 11/13/2018 1337   BILIDIR 0.12 06/02/2017 0945      Component Value Date/Time   TSH 1.850 07/26/2017 1127     Ref. Range 11/13/2018 13:37  Vitamin D, 25-Hydroxy Latest Ref Range: 30.0 - 100.0 ng/mL 29.1 (L)     OBESITY BEHAVIORAL INTERVENTION VISIT  Today's visit was #27 Starting weight: 199 lbs Starting date: 07/06/17 Today's weight : Weight: 186 lb (84.4 kg)  Today's date: 01/24/19 Total lbs lost to date: 13 lbs At least 15 minutes were spent on discussing the following behavioral intervention visit.   ASK: We discussed the diagnosis of obesity with Arva Chafe today and Corynn agreed to give [name] permission to discuss obesity behavioral modification therapy today.  ASSESS: Ronne has the diagnosis of obesity and her BMI today is 32.96 Belladonna is in the action stage of change   ADVISE: Sadiya was educated on the multiple health risks of obesity as well as the benefit of weight loss to improve her health. She was advised of the need for long term treatment and the importance of lifestyle modifications to improve her current health and to decrease her risk of future health problems.  AGREE: Multiple dietary modification options and treatment options were discussed and  Delona agreed to follow the recommendations documented in the above note.  ARRANGE: Tsuyako was educated on the importance of frequent visits to treat obesity as outlined per CMS and USPSTF guidelines and agreed to schedule her next follow up appointment today.  Otis Peak, am acting as transcriptionist for [name], PA-C  I, [name], PA-C have reviewed above note  and agree with its content

## 2019-02-14 ENCOUNTER — Ambulatory Visit (INDEPENDENT_AMBULATORY_CARE_PROVIDER_SITE_OTHER): Payer: No Typology Code available for payment source | Admitting: Physician Assistant

## 2019-02-21 MED FILL — VIT D2 1.25 MG (50,000 UNIT: 1.25 MG | 28 days supply | Qty: 4 | Fill #0

## 2019-02-28 ENCOUNTER — Other Ambulatory Visit: Payer: Self-pay

## 2019-02-28 ENCOUNTER — Encounter (INDEPENDENT_AMBULATORY_CARE_PROVIDER_SITE_OTHER): Payer: Self-pay | Admitting: Bariatrics

## 2019-02-28 ENCOUNTER — Ambulatory Visit (INDEPENDENT_AMBULATORY_CARE_PROVIDER_SITE_OTHER): Payer: No Typology Code available for payment source | Admitting: Bariatrics

## 2019-02-28 VITALS — BP 116/79 | HR 84 | Temp 98.1°F | Ht 63.0 in | Wt 188.0 lb

## 2019-02-28 DIAGNOSIS — Z6833 Body mass index (BMI) 33.0-33.9, adult: Secondary | ICD-10-CM

## 2019-02-28 DIAGNOSIS — E7849 Other hyperlipidemia: Secondary | ICD-10-CM

## 2019-02-28 DIAGNOSIS — E559 Vitamin D deficiency, unspecified: Secondary | ICD-10-CM

## 2019-02-28 DIAGNOSIS — E669 Obesity, unspecified: Secondary | ICD-10-CM

## 2019-02-28 DIAGNOSIS — F3289 Other specified depressive episodes: Secondary | ICD-10-CM

## 2019-02-28 NOTE — Progress Notes (Signed)
Office: [phone]  /  Fax: [phone]   HPI:   Chief Complaint: OBESITY Alison Davies is here to discuss her progress with her obesity treatment plan. She is on the  follow the Category 3 plan and is following her eating plan approximately 10 % of the time. She states she is exercising by walking 10,00 steps 5 times per week. Alison Davies is up 2 lbs and states that she is under a lot of stress.  Her weight is 188 lb (85.3 kg) today and has had a weight gain of 2 pounds over a period of 4 weeks since her last visit. She has lost 11 lbs since starting treatment with [name].  Hyperlipidemia Alison Davies has hyperlipidemia and has been trying to improve her cholesterol levels with intensive lifestyle modification including a low saturated fat diet, exercise and weight loss. She denies any chest pain, claudication or myalgias. She is not on any medications.   Vitamin D deficiency Alison Davies has a diagnosis of vitamin D deficiency. She is currently taking vit D and denies nausea, vomiting or muscle weakness.  Depression with emotional eating behaviors Alison Davies is struggling with emotional eating and using food for comfort to the extent that it is negatively impacting her health. She often snacks when she is not hungry. Alison Davies sometimes feels she is out of control and then feels guilty that she made poor food choices. She has been working on behavior modification techniques to help reduce her emotional eating and has been somewhat successful. She shows no sign of suicidal or homicidal ideations. She is taking Zoloft and Xanax.   Depression screen [facility] 2/9 08/03/2018 01/10/2018 12/20/2017 12/06/2017 11/23/2017  Decreased Interest 3 0 0 1 2  Down, Depressed, Hopeless 3 0 0 1 2  PHQ - 2 Score 6 0 0 2 4  Altered sleeping Tired, decreased energy Change in appetite 3 0 0 0 0  Feeling bad or failure about yourself  0 0 0 0 0  Trouble concentrating 0 0 0 0 0  Moving slowly or fidgety/restless 2 0 0 0 0  Suicidal  thoughts 0 0 0 - 0  PHQ-9 Score Difficult doing work/chores - - - - -  Some encounter information is confidential and restricted. Go to Review Flowsheets activity to see all data.  Some recent data might be hidden     ASSESSMENT AND PLAN:  Other hyperlipidemia  Vitamin D deficiency  Other depression - with emotional eating  Class 1 obesity with serious comorbidity and body mass index (BMI) of 33.0 to 33.9 in adult, unspecified obesity type  PLAN: Hyperlipidemia Alison Davies was informed of the American Heart Association Guidelines emphasizing intensive lifestyle modifications as the first line treatment for hyperlipidemia. We discussed many lifestyle modifications today in depth, and Amar will continue to work on decreasing saturated fats such as fatty red meat, butter and many fried foods. She will also increase vegetables and lean protein in her diet and continue to work on exercise and weight loss efforts.  Vitamin D Deficiency Alison Davies was informed that low vitamin D levels contributes to fatigue and are associated with obesity, breast, and colon cancer. She agrees to continue to take prescription Vit D ,000 IU every week and will follow up for routine testing of vitamin D, at least 2-3 times per year. She was informed of the risk of over-replacement of vitamin D and agrees to not increase her  dose unless she discusses this with us first. Agrees to follow up with our clinic as directed.   Depression with Emotional Eating Behaviors We discussed behavior modification techniques today to help Alison Davies deal with her emotional eating and depression. She has agreed to referral to Dr. Ubaldo GlassingMargaret Veateh [address] [place] [phone] and trigger for emotional eating handout given and agreed to follow up as directed.  Obesity Alison Davies is currently in the action stage of change. As such, her goal is to continue with weight loss efforts She has agreed to follow the Category 3 plan  with additional lunch options.  Alison Davies has been instructed to work up to a goal of 150 minutes of combined cardio and strengthening exercise per week for weight loss and overall health benefits. We discussed the following Behavioral Modification Strategies today:increasing water intake, no skipping meals, keeping healthy foods in the home, planning for success, increasing lean protein intake, decreasing simple carbohydrates , increasing vegetables, decrease eating out and work on meal planning and easy cooking plans   Alison Davies has agreed to follow up with our clinic in 2-3 weeks. She was informed of the importance of frequent follow up visits to maximize her success with intensive lifestyle modifications for her multiple health conditions.  ALLERGIES: No Known Allergies  MEDICATIONS: Current Outpatient Medications on File Prior to Visit  Medication Sig Dispense Refill  . albuterol (VENTOLIN HFA) 108 (90 Base) MCG/ACT inhaler Inhale 2 puffs into the lungs every 6 (six) hours as needed for wheezing or shortness of breath. 18 g 0  . ALPRAZolam (XANAX) 0.25 MG tablet Take 1 tablet (0.25 mg total) by mouth at bedtime as needed for anxiety. 30 tablet 0  . Biotin 3 MG TABS Take 2 tablets by mouth daily.    . Cyanocobalamin (VITAMIN B-12 CR) 1500 MCG TBCR Take 2 tablets by mouth 2 (two) times daily.    . Melatonin 5 MG CHEW Chew 2 capsules by mouth at bedtime as needed.    . Multiple Vitamin (MULTIVITAMIN WITH MINERALS) TABS tablet Take 1 tablet by mouth daily.    . norethindrone (MICRONOR,CAMILA,ERRIN) 0.35 MG tablet Take 1 tablet (0.35 mg total) by mouth daily. 3 Package 3  . sertraline (ZOLOFT) 100 MG tablet Take 1 tablet (100 mg total) by mouth daily. 90 tablet 3  . SUMAtriptan (IMITREX) 25 MG tablet Take 1 tablet (25 mg total) by mouth every 2 (two) hours as needed for migraine. May repeat in 2 hours if headache persists or recurs. 10 tablet 2  . topiramate (TOPAMAX) 100 MG tablet Take 1 tablet (100  mg total) by mouth at bedtime. 90 tablet 3  . Turmeric (QC TUMERIC COMPLEX PO) Take by mouth.    . vitamin C (ASCORBIC ACID) 250 MG tablet Take 250 mg by mouth daily.    . Vitamin D, Ergocalciferol, (DRISDOL) 1.25 MG (50000 UT) CAPS capsule Take 1 capsule (50,000 Units total) by mouth every 7 (seven) days. 4 capsule 0   No current facility-administered medications on file prior to visit.     PAST MEDICAL HISTORY: Past Medical History:  Diagnosis Date  . Anxiety   . Hyperlipidemia   . Leg edema   . Migraines     PAST SURGICAL HISTORY: No past surgical history on file.  SOCIAL HISTORY: Social History   Tobacco Use  . Smoking status: Never Smoker  . Smokeless tobacco: Never Used  Substance Use Topics  . Alcohol use: Yes    Comment: occasional  .  Drug use: Never    FAMILY HISTORY: Family History  Problem Relation Age of Onset  . Anxiety disorder Mother   . Diabetes Father   . Obesity Father     ROS: Review of Systems  Constitutional: Negative for weight loss.  Cardiovascular: Negative for chest pain and claudication.  Gastrointestinal: Negative for nausea and vomiting.  Musculoskeletal: Negative for myalgias.       Negative for muscle weakness  Psychiatric/Behavioral: Positive for depression. Negative for suicidal ideas.    PHYSICAL EXAM: Blood pressure 116/79, pulse 84, temperature 98.1 F (36.7 C), temperature source Oral, height 5\' 3"  (1.6 m), weight 188 lb (85.3 kg), SpO2 97 %. Body mass index is 33.3 kg/m. Physical Exam Vitals signs reviewed.  Constitutional:      Appearance: Normal appearance. She is obese.  HENT:     Head: Normocephalic.     Nose: Nose normal.  Neck:     Musculoskeletal: Normal range of motion.  Cardiovascular:     Rate and Rhythm: Normal rate.  Pulmonary:     Effort: Pulmonary effort is normal.  Musculoskeletal: Normal range of motion.  Skin:    General: Skin is warm and dry.  Neurological:     Mental Status: She is alert  and oriented to person, place, and time.  Psychiatric:        Mood and Affect: Mood normal.        Behavior: Behavior normal.     RECENT LABS AND TESTS: BMET    Component Value Date/Time   NA 140 11/13/2018 1337   K 4.2 11/13/2018 1337   CL 106 11/13/2018 1337   CO2 19 (L) 11/13/2018 1337   GLUCOSE 90 11/13/2018 1337   GLUCOSE 84 03/03/2014 1208   BUN 13 11/13/2018 1337   CREATININE 0.70 11/13/2018 1337   CALCIUM 9.5 11/13/2018 1337   GFRNONAA 116 11/13/2018 1337   GFRAA 134 11/13/2018 1337   Lab Results  Component Value Date   HGBA1C 5.0 11/13/2018   HGBA1C 4.9 04/19/2018   HGBA1C 4.7 (L) 12/19/2017   HGBA1C 5.1 07/26/2017   Lab Results  Component Value Date   INSULIN 14.4 11/13/2018   INSULIN 21.8 04/19/2018   INSULIN 18.0 12/19/2017   INSULIN 14.6 07/26/2017   CBC    Component Value Date/Time   WBC 8.2 06/02/2017 0945   RBC 4.78 06/02/2017 0945   HGB 14.5 06/02/2017 0945   HCT 44.3 06/02/2017 0945   PLT 323 06/02/2017 0945   MCV 93 06/02/2017 0945   MCH 30.3 06/02/2017 0945   MCHC 32.7 06/02/2017 0945   RDW 12.3 06/02/2017 0945   LYMPHSABS 2.4 06/02/2017 0945   EOSABS 0.2 06/02/2017 0945   BASOSABS 0.0 06/02/2017 0945   Iron/TIBC/Ferritin/ %Sat No results found for: IRON, TIBC, FERRITIN, IRONPCTSAT Lipid Panel     Component Value Date/Time   CHOL 221 (H) 11/13/2018 1337   TRIG 177 (H) 11/13/2018 1337   HDL 49 11/13/2018 1337   CHOLHDL 5.0 (H) 06/02/2017 0945   CHOLHDL 5.1 03/03/2014 1208   VLDL 41 (H) 03/03/2014 1208   LDLCALC 137 (H) 11/13/2018 1337   Hepatic Function Panel     Component Value Date/Time   PROT 7.1 11/13/2018 1337   ALBUMIN 4.4 11/13/2018 1337   AST 17 11/13/2018 1337   ALT 20 11/13/2018 1337   ALKPHOS 70 11/13/2018 1337   BILITOT 0.4 11/13/2018 1337   BILIDIR 0.12 06/02/2017 0945      Component Value Date/Time   TSH  1.850 07/26/2017 1127      OBESITY BEHAVIORAL INTERVENTION VISIT  Today's visit was # 28   Starting weight: 199 lbs Starting date: 07/06/17 Today's weight : Weight: 188 lb (85.3 kg)  Today's date: 02/28/2019 Total lbs lost to date: 11 At least 15 minutes were spent on discussing the following behavioral intervention visit.   ASK: We discussed the diagnosis of obesity with Alison Davies today and Alison Davies agreed to give [name] permission to discuss obesity behavioral modification therapy today.  ASSESS: Alison Davies has the diagnosis of obesity and her BMI today is 33.31 Alison Davies is in the action stage of change   ADVISE: Majesty was educated on the multiple health risks of obesity as well as the benefit of weight loss to improve her health. She was advised of the need for long term treatment and the importance of lifestyle modifications to improve her current health and to decrease her risk of future health problems.  AGREE: Multiple dietary modification options and treatment options were discussed and  Nabilah agreed to follow the recommendations documented in the above note.  ARRANGE: Dierdre was educated on the importance of frequent visits to treat obesity as outlined per CMS and USPSTF guidelines and agreed to schedule her next follow up appointment today.  Otis Peak, am acting as transcriptionist for [employer], DO   I have reviewed the above documentation for accuracy and completeness, and I agree with the above. -Corinna Capra, DO

## 2019-03-21 ENCOUNTER — Encounter (INDEPENDENT_AMBULATORY_CARE_PROVIDER_SITE_OTHER): Payer: Self-pay | Admitting: Physician Assistant

## 2019-03-21 ENCOUNTER — Ambulatory Visit (INDEPENDENT_AMBULATORY_CARE_PROVIDER_SITE_OTHER): Payer: No Typology Code available for payment source | Admitting: Physician Assistant

## 2019-03-21 ENCOUNTER — Other Ambulatory Visit: Payer: Self-pay

## 2019-03-21 VITALS — BP 116/73 | HR 66 | Temp 97.6°F | Ht 63.0 in | Wt 189.0 lb

## 2019-03-21 DIAGNOSIS — E669 Obesity, unspecified: Secondary | ICD-10-CM | POA: Diagnosis not present

## 2019-03-21 DIAGNOSIS — E559 Vitamin D deficiency, unspecified: Secondary | ICD-10-CM | POA: Diagnosis not present

## 2019-03-21 DIAGNOSIS — Z6833 Body mass index (BMI) 33.0-33.9, adult: Secondary | ICD-10-CM

## 2019-03-21 DIAGNOSIS — Z9189 Other specified personal risk factors, not elsewhere classified: Secondary | ICD-10-CM

## 2019-03-21 DIAGNOSIS — E7849 Other hyperlipidemia: Secondary | ICD-10-CM | POA: Diagnosis not present

## 2019-03-21 MED ORDER — VITAMIN D (ERGOCALCIFEROL) 1.25 MG (50000 UNIT) PO CAPS: 50000.0000 [IU] | ORAL_CAPSULE | ORAL | 0 refills | Status: DC

## 2019-03-21 MED FILL — NORETHINDRONE 0.35 MG TAB: 0.35 | 84 days supply | Qty: 84 | Fill #3

## 2019-03-21 MED FILL — VIT D2 1.25 MG (50,000 UNIT: 1.25 MG | 28 days supply | Qty: 4 | Fill #0

## 2019-03-25 NOTE — Progress Notes (Signed)
Office: [phone]  /  Fax: [phone]   HPI:   Chief Complaint: OBESITY Alison Davies is here to discuss her progress with her obesity treatment plan. She is on the Category 3 plan and is following her eating plan approximately 25 % of the time. She states she is exercising 0 minutes 0 times per week. Alliya reports that she has not been meal planning. She is changing her work schedule to work weekend nights. Her weight is 189 lb (85.7 kg) today and has had a weight gain of 1 pound over a period of 3 weeks since her last visit. She has lost 10 lbs since starting treatment with [name].  Vitamin D deficiency Alison Davies has a diagnosis of vitamin D deficiency. Alison Davies is currently taking vit D and she denies nausea, vomiting or muscle weakness.  At risk for osteopenia and osteoporosis Alison Davies is at higher risk of osteopenia and osteoporosis due to vitamin D deficiency.   Hyperlipidemia Alison Davies has hyperlipidemia and she is not on medications. She has been trying to improve her cholesterol levels with intensive lifestyle modification including a low saturated fat diet, exercise and weight loss. She denies any chest pain.  ASSESSMENT AND PLAN:  Vitamin D deficiency - Plan: Vitamin D, Ergocalciferol, (DRISDOL) 1.25 MG (50000 UT) CAPS capsule  Other hyperlipidemia  At risk for osteoporosis  Class 1 obesity with serious comorbidity and body mass index (BMI) of 33.0 to 33.9 in adult, unspecified obesity type  PLAN:  Vitamin D Deficiency Alison Davies was informed that low vitamin D levels contributes to fatigue and are associated with obesity, breast, and colon cancer. Alison Davies agrees to continue to take prescription Vit D @50 ,000 IU every week #4 with no refills and she will follow up for routine testing of vitamin D, at least 2-3 times per year. She was informed of the risk of over-replacement of vitamin D and agrees to not increase her dose unless she discusses this with first. Alison Davies agrees to follow up with our  clinic in 3 weeks.  At risk for osteopenia and osteoporosis Alison Davies was given extended  (15 minutes) osteoporosis prevention counseling today. Alison Davies is at risk for osteopenia and osteoporosis due to her vitamin D deficiency. She was encouraged to take her vitamin D and follow her higher calcium diet and increase strengthening exercise to help strengthen her bones and decrease her risk of osteopenia and osteoporosis.  Hyperlipidemia Alison Davies was informed of the American Heart Association Guidelines emphasizing intensive lifestyle modifications as the first line treatment for hyperlipidemia. We discussed many lifestyle modifications today in depth, and Alison Davies will continue to work on decreasing saturated fats such as fatty red meat, butter and many fried foods. She will also increase vegetables and lean protein in her diet and continue to work on exercise and weight loss efforts.  Obesity Alison Davies is currently in the action stage of change. As such, her goal is to continue with weight loss efforts She has agreed to change to keeping a food journal with 1500 calories and 95 grams of protein daily Alison Davies has been instructed to work up to a goal of 150 minutes of combined cardio and strengthening exercise per week for weight loss and overall health benefits. We discussed the following Behavioral Modification Strategies today: keeping healthy foods in the home, work on meal planning and easy cooking plans and holiday eating strategies   Alison Davies has agreed to follow up with our clinic in 3 weeks. She was informed of the importance of frequent follow up  visits to maximize her success with intensive lifestyle modifications for her multiple health conditions.  ALLERGIES: No Known Allergies  MEDICATIONS: Current Outpatient Medications on File Prior to Visit  Medication Sig Dispense Refill   albuterol (VENTOLIN HFA) 108 (90 Base) MCG/ACT inhaler Inhale 2 puffs into the lungs every 6 (six) hours as needed for  wheezing or shortness of breath. 18 g 0   ALPRAZolam (XANAX) 0.25 MG tablet Take 1 tablet (0.25 mg total) by mouth at bedtime as needed for anxiety. 30 tablet 0   Biotin 3 MG TABS Take 2 tablets by mouth daily.     Cyanocobalamin (VITAMIN B-12 CR) 1500 MCG TBCR Take 2 tablets by mouth 2 (two) times daily.     Melatonin 5 MG CHEW Chew 2 capsules by mouth at bedtime as needed.     Multiple Vitamin (MULTIVITAMIN WITH MINERALS) TABS tablet Take 1 tablet by mouth daily.     norethindrone (MICRONOR,CAMILA,ERRIN) 0.35 MG tablet Take 1 tablet (0.35 mg total) by mouth daily. 3 Package 3   sertraline (ZOLOFT) 100 MG tablet Take 1 tablet (100 mg total) by mouth daily. 90 tablet 3   SUMAtriptan (IMITREX) 25 MG tablet Take 1 tablet (25 mg total) by mouth every 2 (two) hours as needed for migraine. May repeat in 2 hours if headache persists or recurs. 10 tablet 2   topiramate (TOPAMAX) 100 MG tablet Take 1 tablet (100 mg total) by mouth at bedtime. 90 tablet 3   Turmeric (QC TUMERIC COMPLEX PO) Take by mouth.     vitamin C (ASCORBIC ACID) 250 MG tablet Take 250 mg by mouth daily.     No current facility-administered medications on file prior to visit.     PAST MEDICAL HISTORY: Past Medical History:  Diagnosis Date   Anxiety    Hyperlipidemia    Leg edema    Migraines     PAST SURGICAL HISTORY: History reviewed. No pertinent surgical history.  SOCIAL HISTORY: Social History   Tobacco Use   Smoking status: Never Smoker   Smokeless tobacco: Never Used  Substance Use Topics   Alcohol use: Yes    Comment: occasional   Drug use: Never    FAMILY HISTORY: Family History  Problem Relation Age of Onset   Anxiety disorder Mother    Diabetes Father    Obesity Father     ROS: Review of Systems  Constitutional: Negative for weight loss.  Cardiovascular: Negative for chest pain.  Gastrointestinal: Negative for nausea and vomiting.  Musculoskeletal:       Negative for  muscle weakness    PHYSICAL EXAM: Blood pressure 116/73, pulse 66, temperature 97.6 F (36.4 C), temperature source Oral, height 5\' 3"  (1.6 m), weight 189 lb (85.7 kg), SpO2 98 %. Body mass index is 33.48 kg/m. Physical Exam Vitals signs reviewed.  Constitutional:      Appearance: Normal appearance. She is well-developed. She is obese.  Cardiovascular:     Rate and Rhythm: Normal rate.  Pulmonary:     Effort: Pulmonary effort is normal.  Musculoskeletal: Normal range of motion.  Skin:    General: Skin is warm and dry.  Neurological:     Mental Status: She is alert and oriented to person, place, and time.  Psychiatric:        Mood and Affect: Mood normal.        Behavior: Behavior normal.     RECENT LABS AND TESTS: BMET    Component Value Date/Time   NA 140  11/13/2018 1337   K 4.2 11/13/2018 1337   CL 106 11/13/2018 1337   CO2 19 (L) 11/13/2018 1337   GLUCOSE 90 11/13/2018 1337   GLUCOSE 84 03/03/2014 1208   BUN 13 11/13/2018 1337   CREATININE 0.70 11/13/2018 1337   CALCIUM 9.5 11/13/2018 1337   GFRNONAA 116 11/13/2018 1337   GFRAA 134 11/13/2018 1337   Lab Results  Component Value Date   HGBA1C 5.0 11/13/2018   HGBA1C 4.9 04/19/2018   HGBA1C 4.7 (L) 12/19/2017   HGBA1C 5.1 07/26/2017   Lab Results  Component Value Date   INSULIN 14.4 11/13/2018   INSULIN 21.8 04/19/2018   INSULIN 18.0 12/19/2017   INSULIN 14.6 07/26/2017   CBC    Component Value Date/Time   WBC 8.2 06/02/2017 0945   RBC 4.78 06/02/2017 0945   HGB 14.5 06/02/2017 0945   HCT 44.3 06/02/2017 0945   PLT 323 06/02/2017 0945   MCV 93 06/02/2017 0945   MCH 30.3 06/02/2017 0945   MCHC 32.7 06/02/2017 0945   RDW 12.3 06/02/2017 0945   LYMPHSABS 2.4 06/02/2017 0945   EOSABS 0.2 06/02/2017 0945   BASOSABS 0.0 06/02/2017 0945   Iron/TIBC/Ferritin/ %Sat No results found for: IRON, TIBC, FERRITIN, IRONPCTSAT Lipid Panel     Component Value Date/Time   CHOL 221 (H) 11/13/2018 1337    TRIG 177 (H) 11/13/2018 1337   HDL 49 11/13/2018 1337   CHOLHDL 5.0 (H) 06/02/2017 0945   CHOLHDL 5.1 03/03/2014 1208   VLDL 41 (H) 03/03/2014 1208   LDLCALC 137 (H) 11/13/2018 1337   Hepatic Function Panel     Component Value Date/Time   PROT 7.1 11/13/2018 1337   ALBUMIN 4.4 11/13/2018 1337   AST 17 11/13/2018 1337   ALT 20 11/13/2018 1337   ALKPHOS 70 11/13/2018 1337   BILITOT 0.4 11/13/2018 1337   BILIDIR 0.12 06/02/2017 0945      Component Value Date/Time   TSH 1.850 07/26/2017 1127     Ref. Range 11/13/2018 13:37  Vitamin D, 25-Hydroxy Latest Ref Range: 30.0 - 100.0 ng/mL 29.1 (L)    OBESITY BEHAVIORAL INTERVENTION VISIT  Today's visit was # 29   Starting weight: 199 lbs Starting date: 07/26/2017 Today's weight : 189 lbs Today's date: 03/21/2019 Total lbs lost to date: 10    03/21/2019  Height  (1.6 m)  Weight 189 lb (85.7 kg)  BMI (Calculated) 33.49  BLOOD PRESSURE - SYSTOLIC 116  BLOOD PRESSURE - DIASTOLIC 73   Body Fat % 41.1 %  Total Body Water (lbs) 74.6 lbs    ASK: We discussed the diagnosis of obesity with Alison Davies today and Alison Davies agreed to give [name] permission to discuss obesity behavioral modification therapy today.  ASSESS: Alison Davies has the diagnosis of obesity and her BMI today is 33.49 [place] is in the action stage of change   ADVISE: Alison Davies was educated on the multiple health risks of obesity as well as the benefit of weight loss to improve her health. She was advised of the need for long term treatment and the importance of lifestyle modifications to improve her current health and to decrease her risk of future health problems.  AGREE: Multiple dietary modification options and treatment options were discussed and  Alison Davies agreed to follow the recommendations documented in the above note.  ARRANGE: Alison Davies was educated on the importance of frequent visits to treat obesity as outlined per CMS and USPSTF guidelines and agreed to schedule her  next follow  up appointment today.  Cristi LoronI, Joanne Murray, am acting as transcriptionist for Alois Clicheracey Aguilar, PA-C I, Alois Clicheracey Aguilar, PA-C have reviewed above note and agree with its content

## 2019-04-03 MED FILL — NORETHINDRONE 0.35 MG TAB: 0.35 | 84 days supply | Qty: 84 | Fill #3

## 2019-04-03 MED FILL — VIT D2 1.25 MG (50,000 UNIT: 1.25 MG | 28 days supply | Qty: 4 | Fill #0

## 2019-04-08 ENCOUNTER — Other Ambulatory Visit: Payer: Self-pay

## 2019-04-08 ENCOUNTER — Encounter: Payer: Self-pay | Admitting: Family Medicine

## 2019-04-08 ENCOUNTER — Ambulatory Visit (INDEPENDENT_AMBULATORY_CARE_PROVIDER_SITE_OTHER): Payer: No Typology Code available for payment source | Admitting: Family Medicine

## 2019-04-08 DIAGNOSIS — M25562 Pain in left knee: Secondary | ICD-10-CM

## 2019-04-08 MED ORDER — MELOXICAM 7.5 MG PO TABS: 7.5000 mg | ORAL_TABLET | Freq: Every day | ORAL | 0 refills | Status: DC | PRN

## 2019-04-08 MED FILL — MELOXICAM 7.5 MG TABLET: 7.5 | 15 days supply | Qty: 30 | Fill #0

## 2019-04-08 NOTE — Assessment & Plan Note (Signed)
-  Unlikely to have fracture based on exam findings.  -Recommend continued icing/elevation.  -Meloxicam as needed.  -Call if not improving over the next couple of weeks.

## 2019-04-08 NOTE — Patient Instructions (Signed)
Continue icing and elevation.  Use meloxicam as needed.  Let me know if not improving over the next couple of weeks.

## 2019-04-08 NOTE — Progress Notes (Signed)
Alison Davies - 33 y.o. female MRN [id number]  Date of birth: 04-18-1986  Subjective Chief Complaint  Patient presents with  . Knee Pain    Slipped in shower this morning landing on herleft knee. Pain comes goes- left knee. Pt has elevated and iced this morning -- swelling seems to have resolved.     HPI Alison Davies is a 33 y.o. female here today with complaint of L knee pain.  She reports fall while showering this morning, hitting knee cap area.  Had some swelling initially but this has improved with icing and elevation.  She denies weakness, numbness/tingling or feeling of instability. She has not tried anything for pain control other than the icing.    ROS:  A comprehensive ROS was completed and negative except as noted per HPI  No Known Allergies  Past Medical History:  Diagnosis Date  . Anxiety   . Hyperlipidemia   . Leg edema   . Migraines     History reviewed. No pertinent surgical history.  Social History   Socioeconomic History  . Marital status: Single    Spouse name: Not on file  . Number of children: Not on file  . Years of education: Not on file  . Highest education level: Not on file  Occupational History  . Occupation: [employer] manager: [employer]  Social Needs  . Financial resource strain: Not on file  . Food insecurity    Worry: Not on file    Inability: Not on file  . Transportation needs    Medical: Not on file    Non-medical: Not on file  Tobacco Use  . Smoking status: Never Smoker  . Smokeless tobacco: Never Used  Substance and Sexual Activity  . Alcohol use: Yes    Comment: occasional  . Drug use: Never  . Sexual activity: Not on file  Lifestyle  . Physical activity    Days per week: Not on file    Minutes per session: Not on file  . Stress: Not on file  Relationships  . Social Musician on phone: Not on file    Gets together: Not on file    Attends religious service: Not on file    Active member of club or  organization: Not on file    Attends meetings of clubs or organizations: Not on file    Relationship status: Not on file  Other Topics Concern  . Not on file  Social History Narrative  . Not on file    Family History  Problem Relation Age of Onset  . Anxiety disorder Mother   . Diabetes Father   . Obesity Father     Health Maintenance  Topic Date Due  . PAP SMEAR-Modifier  10/24/2020  . TETANUS/TDAP  03/04/2028  . INFLUENZA VACCINE  Completed  . HIV Screening  Completed    ----------------------------------------------------------------------------------------------------------------------------------------------------------------------------------------------------------------- Physical Exam BP 118/80 (BP Location: Left Arm, Patient Position: Sitting, Cuff Size: Normal)   Pulse 76   Temp (!) 97.1 F (36.2 C) (Temporal)   Ht 5\' 3"  (1.6 m)   Wt 196 lb 9.6 oz (89.2 kg)   SpO2 99%   BMI 34.83 kg/m   Physical Exam Constitutional:      Appearance: Normal appearance.  Eyes:     General: No scleral icterus. Musculoskeletal:     Comments: L knee with mild swelling along anterior, lateral knee.  ROM is normal. Strength is normal.  No bony  tenderness along patella, tibia or fibular head.  She is able to bear weight and walk without significant difficulty.  Ligaments without laxity.  Negative meniscal provocation testing.  Skin:    General: Skin is warm and dry.  Neurological:     General: No focal deficit present.     Mental Status: She is alert.  Psychiatric:        Mood and Affect: Mood normal.        Behavior: Behavior normal.     ------------------------------------------------------------------------------------------------------------------------------------------------------------------------------------------------------------------- Assessment and Plan  Left knee pain -Unlikely to have fracture based on exam findings.  -Recommend continued icing/elevation.   -Meloxicam as needed.  -Call if not improving over the next couple of weeks.

## 2019-04-11 ENCOUNTER — Encounter (INDEPENDENT_AMBULATORY_CARE_PROVIDER_SITE_OTHER): Payer: Self-pay | Admitting: Physician Assistant

## 2019-04-11 ENCOUNTER — Ambulatory Visit (INDEPENDENT_AMBULATORY_CARE_PROVIDER_SITE_OTHER): Payer: No Typology Code available for payment source | Admitting: Physician Assistant

## 2019-04-11 VITALS — BP 114/79 | HR 82 | Temp 98.2°F | Ht 63.0 in | Wt 189.0 lb

## 2019-04-11 DIAGNOSIS — E669 Obesity, unspecified: Secondary | ICD-10-CM

## 2019-04-11 DIAGNOSIS — Z6833 Body mass index (BMI) 33.0-33.9, adult: Secondary | ICD-10-CM | POA: Diagnosis not present

## 2019-04-11 DIAGNOSIS — E7849 Other hyperlipidemia: Secondary | ICD-10-CM | POA: Diagnosis not present

## 2019-04-15 ENCOUNTER — Encounter: Payer: Self-pay | Admitting: Family Medicine

## 2019-04-15 NOTE — Progress Notes (Signed)
Office: [phone]  /  Fax: [phone]   HPI:  Chief Complaint: OBESITY Alison Davies is here to discuss her progress with her obesity treatment plan. She is on the Category 3 plan and states she is following her eating plan approximately 50 % of the time. She states she is exercising 0 minutes 0 times per week.  Patient states that she is adjusting to her new schedule, working weekends. She is eating Roque Cash sandwiches on Saturday and Sunday for lunch and dinner. She is working on meal planning and creating her new routine.  Hyperlipidemia Alison Davies has hyperlipidemia and she is not on medications. She has no chest pain.   Today's visit was # 18 Starting weight: 199 lbs Starting date: 07/26/2017 Today's weight : 189 lbs Today's date: 04/11/2019 Total lbs lost to date: 10 Total lbs lost since last in-office visit: 0  ASSESSMENT AND PLAN:  Other hyperlipidemia  Class 1 obesity with serious comorbidity and body mass index (BMI) of 33.0 to 33.9 in adult, unspecified obesity type  PLAN:  Hyperlipidemia Intensive lifestyle modifications as the first line treatment for hyperlipidemia. We discussed many lifestyle modifications today and Alison Davies will continue to work on weight loss.  I spent > than 50% of the 28 minute visit on counseling as documented in the note.  Obesity Alison Davies is currently in the action stage of change. As such, her goal is to continue with weight loss efforts She has agreed to follow the Category 3 plan Alison Davies has been instructed to work up to a goal of 150 minutes of combined cardio and strengthening exercise per week for weight loss and overall health benefits. We discussed the following Behavioral Modification Strategies today: decrease eating out and holiday eating strategies   Alison Davies has agreed to follow up with our clinic in 3 to 4 weeks. She was informed of the importance of frequent follow up visits to maximize her success with intensive lifestyle modifications  for her multiple health conditions.  ALLERGIES: No Known Allergies  MEDICATIONS: Current Outpatient Medications on File Prior to Visit  Medication Sig Dispense Refill  . albuterol (VENTOLIN HFA) 108 (90 Base) MCG/ACT inhaler Inhale 2 puffs into the lungs every 6 (six) hours as needed for wheezing or shortness of breath. 18 g 0  . ALPRAZolam (XANAX) 0.25 MG tablet Take 1 tablet (0.25 mg total) by mouth at bedtime as needed for anxiety. 30 tablet 0  . Biotin 3 MG TABS Take 2 tablets by mouth daily.    . Cyanocobalamin (VITAMIN B-12 CR) 1500 MCG TBCR Take 2 tablets by mouth 2 (two) times daily.    . Melatonin 5 MG CHEW Chew 2 capsules by mouth at bedtime as needed.    . meloxicam (MOBIC) 7.5 MG tablet Take 1-2 tablets (7.5-15 mg total) by mouth daily as needed for pain. 30 tablet 0  . Multiple Vitamin (MULTIVITAMIN WITH MINERALS) TABS tablet Take 1 tablet by mouth daily.    . norethindrone (MICRONOR,CAMILA,ERRIN) 0.35 MG tablet Take 1 tablet (0.35 mg total) by mouth daily. 3 Package 3  . sertraline (ZOLOFT) 100 MG tablet Take 1 tablet (100 mg total) by mouth daily. 90 tablet 3  . SUMAtriptan (IMITREX) 25 MG tablet Take 1 tablet (25 mg total) by mouth every 2 (two) hours as needed for migraine. May repeat in 2 hours if headache persists or recurs. 10 tablet 2  . topiramate (TOPAMAX) 100 MG tablet Take 1 tablet (100 mg total) by mouth at bedtime. 90 tablet 3  .  Turmeric (QC TUMERIC COMPLEX PO) Take by mouth.    . vitamin C (ASCORBIC ACID) 250 MG tablet Take 250 mg by mouth daily.    . Vitamin D, Ergocalciferol, (DRISDOL) 1.25 MG (50000 UT) CAPS capsule Take 1 capsule (50,000 Units total) by mouth every 7 (seven) days. 4 capsule 0   No current facility-administered medications on file prior to visit.    PAST MEDICAL HISTORY: Past Medical History:  Diagnosis Date  . Anxiety   . Hyperlipidemia   . Leg edema   . Migraines     PAST SURGICAL HISTORY: History reviewed. No pertinent surgical  history.  SOCIAL HISTORY: Social History   Tobacco Use  . Smoking status: Never Smoker  . Smokeless tobacco: Never Used  Substance Use Topics  . Alcohol use: Yes    Comment: occasional  . Drug use: Never    FAMILY HISTORY: Family History  Problem Relation Age of Onset  . Anxiety disorder Mother   . Diabetes Father   . Obesity Father     ROS: Review of Systems  Constitutional: Negative for weight loss.  Cardiovascular: Negative for chest pain.    PHYSICAL EXAM: Blood pressure 114/79, pulse 82, temperature 98.2 F (36.8 C), temperature source Oral, height 5\' 3"  (1.6 m), weight 189 lb (85.7 kg), SpO2 98 %. Body mass index is 33.48 kg/m. Physical Exam Vitals reviewed.  Constitutional:      General: She is not in acute distress.    Appearance: Normal appearance. She is well-developed. She is obese.  Cardiovascular:     Rate and Rhythm: Normal rate.  Pulmonary:     Effort: Pulmonary effort is normal.  Musculoskeletal:        General: Normal range of motion.  Skin:    General: Skin is warm and dry.  Neurological:     Mental Status: She is alert and oriented to person, place, and time.  Psychiatric:        Mood and Affect: Mood normal.        Behavior: Behavior normal.     RECENT LABS AND TESTS: BMET    Component Value Date/Time   NA 140 11/13/2018 1337   K 4.2 11/13/2018 1337   CL 106 11/13/2018 1337   CO2 19 (L) 11/13/2018 1337   GLUCOSE 90 11/13/2018 1337   GLUCOSE 84 03/03/2014 1208   BUN 13 11/13/2018 1337   CREATININE 0.70 11/13/2018 1337   CALCIUM 9.5 11/13/2018 1337   GFRNONAA 116 11/13/2018 1337   GFRAA 134 11/13/2018 1337   Lab Results  Component Value Date   HGBA1C 5.0 11/13/2018   HGBA1C 4.9 04/19/2018   HGBA1C 4.7 (L) 12/19/2017   HGBA1C 5.1 07/26/2017   Lab Results  Component Value Date   INSULIN 14.4 11/13/2018   INSULIN 21.8 04/19/2018   INSULIN 18.0 12/19/2017   INSULIN 14.6 07/26/2017   CBC    Component Value Date/Time    WBC 8.2 06/02/2017 0945   RBC 4.78 06/02/2017 0945   HGB 14.5 06/02/2017 0945   HCT 44.3 06/02/2017 0945   PLT 323 06/02/2017 0945   MCV 93 06/02/2017 0945   MCH 30.3 06/02/2017 0945   MCHC 32.7 06/02/2017 0945   RDW 12.3 06/02/2017 0945   LYMPHSABS 2.4 06/02/2017 0945   EOSABS 0.2 06/02/2017 0945   BASOSABS 0.0 06/02/2017 0945   Iron/TIBC/Ferritin/ %Sat No results found for: IRON, TIBC, FERRITIN, IRONPCTSAT Lipid Panel     Component Value Date/Time   CHOL 221 (H) 11/13/2018 1337  TRIG 177 (H) 11/13/2018 1337   HDL 49 11/13/2018 1337   CHOLHDL 5.0 (H) 06/02/2017 0945   CHOLHDL 5.1 03/03/2014 1208   VLDL 41 (H) 03/03/2014 1208   LDLCALC 137 (H) 11/13/2018 1337   Hepatic Function Panel     Component Value Date/Time   PROT 7.1 11/13/2018 1337   ALBUMIN 4.4 11/13/2018 1337   AST 17 11/13/2018 1337   ALT 20 11/13/2018 1337   ALKPHOS 70 11/13/2018 1337   BILITOT 0.4 11/13/2018 1337   BILIDIR 0.12 06/02/2017 0945      Component Value Date/Time   TSH 1.850 07/26/2017 1127     Ref. Range 11/13/2018 13:37  Vitamin D, 25-Hydroxy Latest Ref Range: 30.0 - 100.0 ng/mL 29.1 (L)    I, [place] CraneJoanne Murray, am acting as transcriptionist for Alois Clicheracey Aguilar, PA-C I, Alois Clicheracey Aguilar, PA-C have reviewed above note and agree with its content

## 2019-04-16 ENCOUNTER — Ambulatory Visit (INDEPENDENT_AMBULATORY_CARE_PROVIDER_SITE_OTHER): Payer: No Typology Code available for payment source | Admitting: Psychology

## 2019-04-16 DIAGNOSIS — F320 Major depressive disorder, single episode, mild: Secondary | ICD-10-CM | POA: Diagnosis not present

## 2019-04-17 ENCOUNTER — Other Ambulatory Visit: Payer: Self-pay

## 2019-04-17 ENCOUNTER — Encounter: Payer: Self-pay | Admitting: Family Medicine

## 2019-04-17 DIAGNOSIS — E669 Obesity, unspecified: Secondary | ICD-10-CM

## 2019-04-17 DIAGNOSIS — Z6833 Body mass index (BMI) 33.0-33.9, adult: Secondary | ICD-10-CM

## 2019-04-22 ENCOUNTER — Other Ambulatory Visit: Payer: Self-pay | Admitting: Family Medicine

## 2019-04-22 ENCOUNTER — Ambulatory Visit: Payer: No Typology Code available for payment source | Admitting: Psychology

## 2019-04-22 DIAGNOSIS — F411 Generalized anxiety disorder: Secondary | ICD-10-CM

## 2019-04-22 NOTE — Telephone Encounter (Signed)
Last OV 08/03/18 Last fill 05/11/18  #90/3 Patient need a follow up visit.

## 2019-04-29 ENCOUNTER — Ambulatory Visit: Payer: No Typology Code available for payment source | Admitting: Psychology

## 2019-05-16 ENCOUNTER — Ambulatory Visit (INDEPENDENT_AMBULATORY_CARE_PROVIDER_SITE_OTHER): Payer: No Typology Code available for payment source | Admitting: Physician Assistant

## 2019-05-16 ENCOUNTER — Other Ambulatory Visit: Payer: Self-pay

## 2019-05-16 ENCOUNTER — Encounter (INDEPENDENT_AMBULATORY_CARE_PROVIDER_SITE_OTHER): Payer: Self-pay | Admitting: Physician Assistant

## 2019-05-16 VITALS — BP 117/82 | HR 80 | Temp 98.2°F | Ht 63.0 in | Wt 191.0 lb

## 2019-05-16 DIAGNOSIS — Z9189 Other specified personal risk factors, not elsewhere classified: Secondary | ICD-10-CM

## 2019-05-16 DIAGNOSIS — E559 Vitamin D deficiency, unspecified: Secondary | ICD-10-CM | POA: Diagnosis not present

## 2019-05-16 DIAGNOSIS — E8881 Metabolic syndrome: Secondary | ICD-10-CM

## 2019-05-16 DIAGNOSIS — E669 Obesity, unspecified: Secondary | ICD-10-CM | POA: Diagnosis not present

## 2019-05-16 DIAGNOSIS — Z6834 Body mass index (BMI) 34.0-34.9, adult: Secondary | ICD-10-CM

## 2019-05-16 MED ORDER — VITAMIN D (ERGOCALCIFEROL) 1.25 MG (50000 UNIT) PO CAPS: 50000.0000 [IU] | ORAL_CAPSULE | ORAL | 0 refills | Status: DC

## 2019-05-16 MED FILL — VIT D2 1.25 MG (50,000 UNIT: 1.25 MG | 28 days supply | Qty: 4 | Fill #0

## 2019-05-20 NOTE — Progress Notes (Signed)
Chief Complaint:   OBESITY Alison Davies is here to discuss her progress with her obesity treatment plan along with follow-up of her obesity related diagnoses. Alison Davies is on the Category 3 Plan and states she is following her eating plan approximately 25% of the time. Alison Davies states she is exercising 0 minutes 0 times per week.  Today's visit was #: 31 Starting weight: 199 lbs Starting date: 07/26/2017 Today's weight: 191 lbs Today's date: 05/16/2019 Total lbs lost to date: 8 Total lbs lost since last in-office visit: 0  Interim History: Alison Davies reports eating fast food more often and drinking [employer] hot chocolate. She is stressed with work. Alison Davies is planning to work out more.  Subjective:   Vitamin D deficiency  Alison Davies's Vitamin D level was 29.1 on 11/13/18. She is on vitamin D weekly. She denies nausea, vomiting or muscle weakness. Alison Davies is due for labs.  Insulin resistance [name] has a diagnosis of insulin resistance based on her elevated fasting insulin level >5. She is not on medications. Her last level was 14.4 (11/13/18). She is due for labs. Alison Davies continues to work on diet and exercise to decrease her risk of diabetes. Alison Davies has no polyphagia.  Lab Results  Component Value Date   INSULIN 14.4 11/13/2018   INSULIN 21.8 04/19/2018   INSULIN 18.0 12/19/2017   INSULIN 14.6 07/26/2017   Lab Results  Component Value Date   HGBA1C 5.0 11/13/2018   At risk for osteoporosis Alison Davies is at higher risk of osteopenia and osteoporosis due to Vitamin D deficiency.   Assessment/Plan:   Vitamin D deficiency  Low Vitamin D level contributes to fatigue and are associated with obesity, breast, and colon cancer. Alison Davies agrees to continue to take prescription Vitamin D @50 ,000 IU every week #4 with no refills and she will follow-up for routine testing of Vitamin D, at least 2-3 times per year to avoid over-replacement.  Insulin resistance Alison Davies will continue to work on weight loss,  exercise, and decreasing simple carbohydrates to help decrease the risk of diabetes. Alison Davies agreed to follow-up with Alison Davies as directed to closely monitor her progress.  At risk for osteoporosis Alison Davies was given approximately 15 minutes of osteoporosis prevention counseling today. Alison Davies is at risk for osteopenia and osteoporosis due to her Vitamin D deficiency. She was encouraged to take her Vitamin D and follow her higher calcium diet and increase strengthening exercise to help strengthen her bones and decrease her risk of osteopenia and osteoporosis.  Obesity Alison Davies is currently in the action stage of change. As such, her goal is to continue with weight loss efforts. She has agreed to on the Category 3 Plan.   Exercise goals: For substantial health benefits, adults should do at least 150 minutes (2 hours and 30 minutes) a week of moderate-intensity, or 75 minutes (1 hour and 15 minutes) a week of vigorous-intensity aerobic physical activity, or an equivalent combination of moderate- and vigorous-intensity aerobic activity. Aerobic activity should be performed in episodes of at least 10 minutes, and preferably, it should be spread throughout the week. Adults should also include muscle-strengthening activities that involve all major muscle groups on 2 or more days a week.  Behavioral modification strategies: meal planning and cooking strategies and planning for success.  Alison Davies has agreed to follow-up with our clinic in 2 weeks. She was informed of the importance of frequent follow-up visits to maximize her success with intensive lifestyle modifications for her multiple health conditions.   Objective:  Blood pressure 117/82, pulse 80, temperature 98.2 F (36.8 C), temperature source Oral, height 5\' 3"  (1.6 m), weight 191 lb (86.6 kg), SpO2 98 %. Body mass index is 33.83 kg/m.  General: Cooperative, alert, well developed, in no acute distress. HEENT: Conjunctivae and lids unremarkable. Cardiovascular:  Regular rhythm.  Lungs: Normal work of breathing. Neurologic: No focal deficits.   Lab Results  Component Value Date   CREATININE 0.70 11/13/2018   BUN 13 11/13/2018   NA 140 11/13/2018   K 4.2 11/13/2018   CL 106 11/13/2018   CO2 19 (L) 11/13/2018   Lab Results  Component Value Date   ALT 20 11/13/2018   AST 17 11/13/2018   ALKPHOS 70 11/13/2018   BILITOT 0.4 11/13/2018   Lab Results  Component Value Date   HGBA1C 5.0 11/13/2018   HGBA1C 4.9 04/19/2018   HGBA1C 4.7 (L) 12/19/2017   HGBA1C 5.1 07/26/2017   Lab Results  Component Value Date   INSULIN 14.4 11/13/2018   INSULIN 21.8 04/19/2018   INSULIN 18.0 12/19/2017   INSULIN 14.6 07/26/2017   Lab Results  Component Value Date   TSH 1.850 07/26/2017   Lab Results  Component Value Date   CHOL 221 (H) 11/13/2018   HDL 49 11/13/2018   LDLCALC 137 (H) 11/13/2018   TRIG 177 (H) 11/13/2018   CHOLHDL 5.0 (H) 06/02/2017   Lab Results  Component Value Date   WBC 8.2 06/02/2017   HGB 14.5 06/02/2017   HCT 44.3 06/02/2017   MCV 93 06/02/2017   PLT 323 06/02/2017   No results found for: IRON, TIBC, FERRITIN   Ref. Range 11/13/2018 13:37  Vitamin D, 25-Hydroxy Latest Ref Range: 30.0 - 100.0 ng/mL 29.1 (L)    Attestation Statements:   Reviewed by clinician on day of visit: allergies, medications, problem list, medical history, surgical history, family history, social history, and previous encounter notes.  Corey Skains, am acting as Location manager for [employer], PA-C.  I have reviewed the above documentation for accuracy and completeness, and I agree with the above. Abby Potash, PA-C

## 2019-05-30 ENCOUNTER — Ambulatory Visit (INDEPENDENT_AMBULATORY_CARE_PROVIDER_SITE_OTHER): Payer: No Typology Code available for payment source | Admitting: Physician Assistant

## 2019-05-31 ENCOUNTER — Other Ambulatory Visit: Payer: Self-pay | Admitting: Family Medicine

## 2019-05-31 DIAGNOSIS — F411 Generalized anxiety disorder: Secondary | ICD-10-CM

## 2019-05-31 MED FILL — TOPIRAMATE 100 MG TABLET: 100 | 90 days supply | Qty: 90 | Fill #2

## 2019-05-31 MED FILL — SERTRALINE HCL 100 MG TABS: 100 | 90 days supply | Qty: 90 | Fill #0

## 2019-05-31 NOTE — Telephone Encounter (Signed)
Last OV 04/08/19  W/Matthews Last fill 05/11/18  #90/3

## 2019-06-06 MED FILL — VIT D2 1.25 MG (50,000 UNIT: 1.25 MG | 28 days supply | Qty: 4 | Fill #0

## 2019-06-06 MED FILL — MELOXICAM 7.5 MG TABLET: 7.5 | 15 days supply | Qty: 30 | Fill #0

## 2019-06-11 ENCOUNTER — Other Ambulatory Visit: Payer: Self-pay

## 2019-06-12 ENCOUNTER — Other Ambulatory Visit (HOSPITAL_COMMUNITY)
Admission: RE | Admit: 2019-06-12 | Discharge: 2019-06-12 | Disposition: A | Discharge status: Home / Self Care | Payer: No Typology Code available for payment source | Source: Ambulatory Visit | Attending: Family Medicine | Admitting: Family Medicine

## 2019-06-12 ENCOUNTER — Ambulatory Visit (INDEPENDENT_AMBULATORY_CARE_PROVIDER_SITE_OTHER): Payer: No Typology Code available for payment source | Admitting: Family Medicine

## 2019-06-12 ENCOUNTER — Encounter: Payer: Self-pay | Admitting: Family Medicine

## 2019-06-12 VITALS — BP 120/98 | HR 77 | Temp 97.5°F | Ht 63.5 in | Wt 194.4 lb

## 2019-06-12 DIAGNOSIS — E559 Vitamin D deficiency, unspecified: Secondary | ICD-10-CM | POA: Diagnosis not present

## 2019-06-12 DIAGNOSIS — Z0000 Encounter for general adult medical examination without abnormal findings: Secondary | ICD-10-CM | POA: Diagnosis not present

## 2019-06-12 DIAGNOSIS — R87629 Unspecified abnormal cytological findings in specimens from vagina: Secondary | ICD-10-CM

## 2019-06-12 DIAGNOSIS — F411 Generalized anxiety disorder: Secondary | ICD-10-CM

## 2019-06-12 DIAGNOSIS — Z113 Encounter for screening for infections with a predominantly sexual mode of transmission: Secondary | ICD-10-CM

## 2019-06-12 DIAGNOSIS — Z124 Encounter for screening for malignant neoplasm of cervix: Secondary | ICD-10-CM | POA: Insufficient documentation

## 2019-06-12 DIAGNOSIS — G43709 Chronic migraine without aura, not intractable, without status migrainosus: Secondary | ICD-10-CM

## 2019-06-12 DIAGNOSIS — E785 Hyperlipidemia, unspecified: Secondary | ICD-10-CM

## 2019-06-12 HISTORY — DX: Unspecified abnormal cytological findings in specimens from vagina: R87.629

## 2019-06-12 LAB — CBC
HCT: 41 % (ref 36.0–46.0)
Hemoglobin: 14 g/dL (ref 12.0–15.0)
MCHC: 34.2 g/dL (ref 30.0–36.0)
MCV: 89.7 fl (ref 78.0–100.0)
Platelets: 299 10*3/uL (ref 150.0–400.0)
RBC: 4.57 Mil/uL (ref 3.87–5.11)
RDW: 12.4 % (ref 11.5–15.5)
WBC: 7.1 10*3/uL (ref 4.0–10.5)

## 2019-06-12 LAB — LIPID PANEL
Cholesterol: 222 mg/dL — ABNORMAL HIGH (ref 0–200)
HDL: 46.8 mg/dL (ref >39.00)
LDL Cholesterol: 156 mg/dL — ABNORMAL HIGH (ref 0–99)
NonHDL: 175.4
Total CHOL/HDL Ratio: 5
Triglycerides: 99 mg/dL (ref 0.0–149.0)
VLDL: 19.8 mg/dL (ref 0.0–40.0)

## 2019-06-12 LAB — VITAMIN D 25 HYDROXY (VIT D DEFICIENCY, FRACTURES): VITD: 23.42 ng/mL — ABNORMAL LOW (ref 30.00–100.00)

## 2019-06-12 MED ORDER — SERTRALINE HCL 100 MG PO TABS: 100.0000 mg | ORAL_TABLET | Freq: Every day | ORAL | 3 refills | Status: DC

## 2019-06-12 MED ORDER — TOPIRAMATE 100 MG PO TABS: 100.0000 mg | ORAL_TABLET | Freq: Every day | ORAL | 3 refills | Status: DC

## 2019-06-12 MED ORDER — ALPRAZOLAM 0.25 MG PO TABS: 0.2500 mg | ORAL_TABLET | Freq: Every evening | ORAL | 0 refills | Status: DC | PRN

## 2019-06-12 MED FILL — ALPRAZolam 0.25 MG TABS: 0.25 | 30 days supply | Qty: 30 | Fill #0

## 2019-06-12 NOTE — Addendum Note (Signed)
Addended by: Overton Mam on: 06/12/2019 09:48 AM   Modules accepted: Orders

## 2019-06-12 NOTE — Progress Notes (Addendum)
Alison Davies is a 34 y.o. female  Chief Complaint  Patient presents with  . Annual Exam    Pt has no complaints today.  Pt is fasting for lab work.+    HPI: Alison Davies is a 34 y.o. female here for annual CPE, fasting labs.  She had labs (CMP, A1C, Vit D, FLP) done in 10/2018 with Abby Potash.  She is working weekend nights as an Garment/textile technologist.  FDLMP: 06/09/19 She is sexually active. Not monogamous. She is using protection. Pt requests STD screening. No vaginal symptoms - itching, odor, discharge. No urinary symptoms.   Last PAP: due for PAP and will do this today  Med refills needed today?  none  Past Medical History:  Diagnosis Date  . Anxiety   . Hyperlipidemia   . Leg edema   . Migraines     History reviewed. No pertinent surgical history.  Social History   Socioeconomic History  . Marital status: Single    Spouse name: Not on file  . Number of children: Not on file  . Years of education: Not on file  . Highest education level: Not on file  Occupational History  . Occupation: Rad Engineer, production: [employer]  Tobacco Use  . Smoking status: Never Smoker  . Smokeless tobacco: Never Used  Substance and Sexual Activity  . Alcohol use: Yes    Comment: occasional  . Drug use: Never  . Sexual activity: Not on file  Other Topics Concern  . Not on file  Social History Narrative  . Not on file   Social Determinants of Health   Financial Resource Strain:   . Difficulty of Paying Living Expenses: Not on file  Food Insecurity:   . Worried About Charity fundraiser in the Last Year: Not on file  . Ran Out of Food in the Last Year: Not on file  Transportation Needs:   . Lack of Transportation (Medical): Not on file  . Lack of Transportation (Non-Medical): Not on file  Physical Activity:   . Days of Exercise per Week: Not on file  . Minutes of Exercise per Session: Not on file  Stress:   . Feeling of Stress : Not on file  Social Connections:   .  Frequency of Communication with Friends and Family: Not on file  . Frequency of Social Gatherings with Friends and Family: Not on file  . Attends Religious Services: Not on file  . Active Member of Clubs or Organizations: Not on file  . Attends Archivist Meetings: Not on file  . Marital Status: Not on file  Intimate Partner Violence:   . Fear of Current or Ex-Partner: Not on file  . Emotionally Abused: Not on file  . Physically Abused: Not on file  . Sexually Abused: Not on file    Family History  Problem Relation Age of Onset  . Anxiety disorder Mother   . Diabetes Father   . Obesity Father      Immunization History  Administered Date(s) Administered  . Influenza-Unspecified 01/17/2014  . PPD Test 02/24/2014, 03/06/2014, 03/27/2015, 03/28/2016  . Tdap 02/17/2014, 03/04/2018    Outpatient Encounter Medications as of 06/12/2019  Medication Sig  . albuterol (VENTOLIN HFA) 108 (90 Base) MCG/ACT inhaler Inhale 2 puffs into the lungs every 6 (six) hours as needed for wheezing or shortness of breath.  . ALPRAZolam (XANAX) 0.25 MG tablet Take 1 tablet (0.25 mg total) by mouth at bedtime  as needed for anxiety.  . Biotin 3 MG TABS Take 2 tablets by mouth daily.  . Cyanocobalamin (VITAMIN B-12 CR) 1500 MCG TBCR Take 2 tablets by mouth 2 (two) times daily.  . Melatonin 5 MG CHEW Chew 2 capsules by mouth at bedtime as needed.  . meloxicam (MOBIC) 7.5 MG tablet Take 1-2 tablets (7.5-15 mg total) by mouth daily as needed for pain.  . Multiple Vitamin (MULTIVITAMIN WITH MINERALS) TABS tablet Take 1 tablet by mouth daily.  . norethindrone (MICRONOR,CAMILA,ERRIN) 0.35 MG tablet Take 1 tablet (0.35 mg total) by mouth daily.  . sertraline (ZOLOFT) 100 MG tablet TAKE 1 TABLET (100 MG TOTAL) BY MOUTH DAILY.  . SUMAtriptan (IMITREX) 25 MG tablet Take 1 tablet (25 mg total) by mouth every 2 (two) hours as needed for migraine. May repeat in 2 hours if headache persists or recurs.  .  topiramate (TOPAMAX) 100 MG tablet Take 1 tablet (100 mg total) by mouth at bedtime.  . Turmeric (QC TUMERIC COMPLEX PO) Take by mouth.  . vitamin C (ASCORBIC ACID) 250 MG tablet Take 250 mg by mouth daily.  . Vitamin D, Ergocalciferol, (DRISDOL) 1.25 MG (50000 UNIT) CAPS capsule Take 1 capsule (50,000 Units total) by mouth every 7 (seven) days.   No facility-administered encounter medications on file as of 06/12/2019.     ROS: Gen: no fever, chills  Skin: no rash, itching ENT: no ear pain, ear drainage, nasal congestion, rhinorrhea, sinus pressure, sore throat Eyes: no blurry vision, double vision Resp: no cough, wheeze,SOB Breast: no breast tenderness, no nipple discharge, no breast masses CV: no CP, palpitations, LE edema,  GI: no heartburn, n/v/d/c, abd pain GU: no dysuria, urgency, frequency, hematuria; no vaginal itching, odor, discharge MSK: no joint pain, myalgias, back pain Neuro: no dizziness, headache, weakness, vertigo Psych: + depression, anxiety   No Known Allergies  BP (!) 120/98 (BP Location: Left Arm, Patient Position: Sitting, Cuff Size: Normal)   Pulse 77   Temp (!) 97.5 F (36.4 C) (Temporal)   Ht 5' 3.5" (1.613 m)   Wt 194 lb 6.4 oz (88.2 kg)   LMP 06/09/2019   SpO2 98%   BMI 33.90 kg/m    BP Readings from Last 3 Encounters:  06/12/19 (!) 120/98  05/16/19 117/82  04/11/19 114/79   Wt Readings from Last 3 Encounters:  06/12/19 194 lb 6.4 oz (88.2 kg)  05/16/19 191 lb (86.6 kg)  04/11/19 189 lb (85.7 kg)    Physical Exam  Constitutional: She is oriented to person, place, and time. She appears well-developed and well-nourished. No distress.  HENT:  Head: Normocephalic and atraumatic.  Right Ear: Tympanic membrane and ear canal normal.  Left Ear: Tympanic membrane and ear canal normal.  Nose: Nose normal.  Mouth/Throat: Oropharynx is clear and moist and mucous membranes are normal.  Eyes: Pupils are equal, round, and reactive to light.  Conjunctivae are normal.  Neck: No thyromegaly present.  Cardiovascular: Normal rate, regular rhythm, normal heart sounds and intact distal pulses.  No murmur heard. Pulmonary/Chest: Effort normal and breath sounds normal. No respiratory distress. She has no wheezes. She has no rhonchi.  Abdominal: Soft. Bowel sounds are normal. She exhibits no distension and no mass. There is no abdominal tenderness.  Genitourinary:    Vagina and uterus normal.  There is no rash, tenderness or lesion on the right labia. There is no rash, tenderness or lesion on the left labia. Cervix exhibits no motion tenderness, no discharge and no  friability. Right adnexum displays no mass, no tenderness and no fullness. Left adnexum displays no mass, no tenderness and no fullness.  Musculoskeletal:        General: No edema.     Cervical back: Neck supple.  Lymphadenopathy:    She has no cervical adenopathy.  Neurological: She is alert and oriented to person, place, and time. She exhibits normal muscle tone. Coordination normal.  Skin: Skin is warm and dry.  Psychiatric: She has a normal mood and affect. Her behavior is normal.     A/P:  1. Annual physical exam - due for PAP today - due for vision and dental exams and encouraged pt to scheduled these - discussed importance of regular CV exercise, healthy diet, adequate sleep - immunizations UTD - CBC - labs done in 10/2018 - next CPE in 1 year  2. GAD (generalized anxiety disorder) - stable, controlled Refill: - ALPRAZolam (XANAX) 0.25 MG tablet; Take 1 tablet (0.25 mg total) by mouth at bedtime as needed for anxiety.  Dispense: 30 tablet; Refill: 0 - sertraline (ZOLOFT) 100 MG tablet; Take 1 tablet (100 mg total) by mouth daily.  Dispense: 90 tablet; Refill: 3  3. Hyperlipidemia, unspecified hyperlipidemia type - Lipid panel  4. Vitamin D deficiency - VITAMIN D 25 Hydroxy (Vit-D Deficiency, Fractures) - pt previously on Rx Vit D  5. Chronic migraine  without aura without status migrainosus, not intractable - stable, controlled Refill: - topiramate (TOPAMAX) 100 MG tablet; Take 1 tablet (100 mg total) by mouth at bedtime.  Dispense: 90 tablet; Refill: 3  6. [facility] (Order [id number]) on 06/12/19  This visit occurred during the SARS-CoV-2 public health emergency.  Safety protocols were in place, including screening questions prior to the visit, additional usage of staff PPE, and extensive cleaning of exam room while observing appropriate contact time as indicated for disinfecting solutions.

## 2019-06-12 NOTE — Patient Instructions (Addendum)
[facility] [address], [place], [place] [address] Phone: [phone]  [facility] [address], [place], [place] [address] Phone: [phone]    Health Maintenance, Female Adopting a healthy lifestyle and getting preventive care are important in promoting health and wellness. Ask your health care provider about:  The right schedule for you to have regular tests and exams.  Things you can do on your own to prevent diseases and keep yourself healthy. What should I know about diet, weight, and exercise? Eat a healthy diet   Eat a diet that includes plenty of vegetables, fruits, low-fat dairy products, and lean protein.  Do not eat a lot of foods that are high in solid fats, added sugars, or sodium. Maintain a healthy weight Body mass index (BMI) is used to identify weight problems. It estimates body fat based on height and weight. Your health care provider can help determine your BMI and help you achieve or maintain a healthy weight. Get regular exercise Get regular exercise. This is one of the most important things you can do for your health. Most adults should:  Exercise for at least 150 minutes each week. The exercise should increase your heart rate and make you sweat (moderate-intensity exercise).  Do strengthening exercises at least twice a week. This is in addition to the moderate-intensity exercise.  Spend less time sitting. Even light physical activity can be beneficial. Watch cholesterol and blood lipids Have your blood tested for lipids and cholesterol at 32 years of age, then have this test every 5 years. Have your cholesterol levels checked more often if:  Your lipid or cholesterol levels are high.  You are older than 32 years of age.  You are at high risk for heart disease. What should I know about cancer screening? Depending on your health history and family history, you may need to have cancer screening at various ages. This may  include screening for:  Breast cancer.  Cervical cancer.  Colorectal cancer.  Skin cancer.  Lung cancer. What should I know about heart disease, diabetes, and high blood pressure? Blood pressure and heart disease  High blood pressure causes heart disease and increases the risk of stroke. This is more likely to develop in people who have high blood pressure readings, are of African descent, or are overweight.  Have your blood pressure checked: ? Every 3-5 years if you are 18-39 years of age. ? Every year if you are 40 years old or older. Diabetes Have regular diabetes screenings. This checks your fasting blood sugar level. Have the screening done:  Once every three years after age 40 if you are at a normal weight and have a low risk for diabetes.  More often and at a younger age if you are overweight or have a high risk for diabetes. What should I know about preventing infection? Hepatitis B If you have a higher risk for hepatitis B, you should be screened for this virus. Talk with your health care provider to find out if you are at risk for hepatitis B infection. Hepatitis C Testing is recommended for:  Everyone born from 1945 through 1965.  Anyone with known risk factors for hepatitis C. Sexually transmitted infections (STIs)  Get screened for STIs, including gonorrhea and chlamydia, if: ? You are sexually active and are younger than 32 years of age. ? You are older than 32 years of age and your health care provider tells you that you are at risk   for this type of infection. ? Your sexual activity has changed since you were last screened, and you are at increased risk for chlamydia or gonorrhea. Ask your health care provider if you are at risk.  Ask your health care provider about whether you are at high risk for HIV. Your health care provider may recommend a prescription medicine to help prevent HIV infection. If you choose to take medicine to prevent HIV, you should first  get tested for HIV. You should then be tested every 3 months for as long as you are taking the medicine. Pregnancy  If you are about to stop having your period (premenopausal) and you may become pregnant, seek counseling before you get pregnant.  Take 400 to 800 micrograms (mcg) of folic acid every day if you become pregnant.  Ask for birth control (contraception) if you want to prevent pregnancy. Osteoporosis and menopause Osteoporosis is a disease in which the bones lose minerals and strength with aging. This can result in bone fractures. If you are 65 years old or older, or if you are at risk for osteoporosis and fractures, ask your health care provider if you should:  Be screened for bone loss.  Take a calcium or vitamin D supplement to lower your risk of fractures.  Be given hormone replacement therapy (HRT) to treat symptoms of menopause. Follow these instructions at home: Lifestyle  Do not use any products that contain nicotine or tobacco, such as cigarettes, e-cigarettes, and chewing tobacco. If you need help quitting, ask your health care provider.  Do not use street drugs.  Do not share needles.  Ask your health care provider for help if you need support or information about quitting drugs. Alcohol use  Do not drink alcohol if: ? Your health care provider tells you not to drink. ? You are pregnant, may be pregnant, or are planning to become pregnant.  If you drink alcohol: ? Limit how much you use to 0-1 drink a day. ? Limit intake if you are breastfeeding.  Be aware of how much alcohol is in your drink. In the [place] one drink equals one 12 oz bottle of beer (355 mL), one 5 oz glass of wine (148 mL), or one 1 oz glass of hard liquor (44 mL). General instructions  Schedule regular health, dental, and eye exams.  Stay current with your vaccines.  Tell your health care provider if: ? You often feel depressed. ? You have ever been abused or do not feel safe at  home. Summary  Adopting a healthy lifestyle and getting preventive care are important in promoting health and wellness.  Follow your health care provider's instructions about healthy diet, exercising, and getting tested or screened for diseases.  Follow your health care provider's instructions on monitoring your cholesterol and blood pressure. This information is not intended to replace advice given to you by your health care provider. Make sure you discuss any questions you have with your health care provider. Document Revised: 04/11/2018 Document Reviewed: 04/11/2018 Elsevier Patient Education  2020 [employer]  

## 2019-06-13 ENCOUNTER — Encounter: Payer: Self-pay | Admitting: Family Medicine

## 2019-06-13 ENCOUNTER — Ambulatory Visit (INDEPENDENT_AMBULATORY_CARE_PROVIDER_SITE_OTHER): Payer: No Typology Code available for payment source | Admitting: Physician Assistant

## 2019-06-13 ENCOUNTER — Encounter (INDEPENDENT_AMBULATORY_CARE_PROVIDER_SITE_OTHER): Payer: Self-pay | Admitting: Physician Assistant

## 2019-06-13 ENCOUNTER — Other Ambulatory Visit: Payer: Self-pay

## 2019-06-13 VITALS — BP 129/81 | HR 72 | Temp 98.1°F | Ht 63.0 in | Wt 190.0 lb

## 2019-06-13 DIAGNOSIS — Z9189 Other specified personal risk factors, not elsewhere classified: Secondary | ICD-10-CM

## 2019-06-13 DIAGNOSIS — R87810 Cervical high risk human papillomavirus (HPV) DNA test positive: Secondary | ICD-10-CM

## 2019-06-13 DIAGNOSIS — R87612 Low grade squamous intraepithelial lesion on cytologic smear of cervix (LGSIL): Secondary | ICD-10-CM

## 2019-06-13 DIAGNOSIS — R739 Hyperglycemia, unspecified: Secondary | ICD-10-CM

## 2019-06-13 DIAGNOSIS — E669 Obesity, unspecified: Secondary | ICD-10-CM | POA: Diagnosis not present

## 2019-06-13 DIAGNOSIS — E559 Vitamin D deficiency, unspecified: Secondary | ICD-10-CM | POA: Diagnosis not present

## 2019-06-13 DIAGNOSIS — Z6833 Body mass index (BMI) 33.0-33.9, adult: Secondary | ICD-10-CM

## 2019-06-13 LAB — CYTOLOGY - PAP
Chlamydia: NEGATIVE
Comment: NEGATIVE
Comment: NEGATIVE
Comment: NORMAL
High risk HPV: POSITIVE — AB
Neisseria Gonorrhea: NEGATIVE

## 2019-06-13 LAB — HEPATITIS C ANTIBODY
Hepatitis C Ab: NONREACTIVE
SIGNAL TO CUT-OFF: 0.02 (ref <1.00)

## 2019-06-13 LAB — RPR: RPR Ser Ql: NONREACTIVE

## 2019-06-13 LAB — HEPATITIS B SURFACE ANTIGEN: Hepatitis B Surface Ag: NONREACTIVE

## 2019-06-13 LAB — HIV ANTIBODY (ROUTINE TESTING W REFLEX): HIV 1&2 Ab, 4th Generation: NONREACTIVE

## 2019-06-13 NOTE — Progress Notes (Signed)
Chief Complaint:   OBESITY Alison Davies is here to discuss her progress with her obesity treatment plan along with follow-up of her obesity related diagnoses. Alison Davies is on the Category 3 Plan and states she is following her eating plan approximately 80% of the time. Alison Davies states she is exercising 0 minutes 0 times per week.  Today's visit was #: 20 Starting weight: 199 lbs Starting date: 07/26/2017 Today's weight: 190 lbs Today's date: 06/13/2019 Total lbs lost to date: 9 Total lbs lost since last in-office visit: 1  Interim History: Alison Davies is doing better overall with her meal planning. She wants to start resistance training at work.  Subjective:   Hyperglycemia. Alison Davies has a history of some elevated blood glucose readings without a diagnosis of diabetes. She denies polyphagia. She is on no medications.  Vitamin D deficiency. Last level was checked yesterday with her PCP and was not at goal. She is not taking medication weekly as prescribed.  At risk for diabetes mellitus. Alison Davies is at higher than average risk for developing diabetes due to her obesity.   Assessment/Plan:   Hyperglycemia. Fasting labs will be obtained and results with be discussed with Alison Davies in 2 weeks at her follow up visit. In the meanwhile Alison Davies was started on a lower simple carbohydrate diet and will work on weight loss efforts. Comprehensive metabolic panel, Hemoglobin A1c, Insulin, random labs ordered.  Vitamin D deficiency. Low Vitamin D level contributes to fatigue and are associated with obesity, breast, and colon cancer. She agrees to continue to take Vitamin D and will follow-up for routine testing of Vitamin D, at least 2-3 times per year to avoid over-replacement.  At risk for diabetes mellitus. Good blood sugar control is important to decrease the likelihood of diabetic complications such as nephropathy, neuropathy, limb loss, blindness, coronary artery disease, and death. Intensive lifestyle  modification including diet, exercise and weight loss are the first line of treatment for diabetes.   Class 1 obesity with serious comorbidity and body mass index (BMI) of 33.0 to 33.9 in adult, unspecified obesity type.  Alison Davies is currently in the action stage of change. As such, her goal is to continue with weight loss efforts. She has agreed to the Category 3 Plan.   Exercise goals: Alison Davies will do resistance training 3 days a week with walking on other days.  Behavioral modification strategies: meal planning and cooking strategies and keeping healthy foods in the home.  Alison Davies has agreed to follow-up with our clinic in 3 weeks. She was informed of the importance of frequent follow-up visits to maximize her success with intensive lifestyle modifications for her multiple health conditions.   Alison Davies was informed we would discuss her lab results at her next visit unless there is a critical issue that needs to be addressed sooner. Alison Davies agreed to keep her next visit at the agreed upon time to discuss these results.  Objective:   Blood pressure 129/81, pulse 72, temperature 98.1 F (36.7 C), temperature source Oral, height 5\' 3"  (1.6 m), weight 190 lb (86.2 kg), last menstrual period 06/09/2019, SpO2 98 %. Body mass index is 33.66 kg/m.  General: Cooperative, alert, well developed, in no acute distress. HEENT: Conjunctivae and lids unremarkable. Cardiovascular: Regular rhythm.  Lungs: Normal work of breathing. Neurologic: No focal deficits.   Lab Results  Component Value Date   CREATININE 0.70 11/13/2018   BUN 13 11/13/2018   NA 140 11/13/2018   K 4.2 11/13/2018   CL 106  11/13/2018   CO2 19 (L) 11/13/2018   Lab Results  Component Value Date   ALT 20 11/13/2018   AST 17 11/13/2018   ALKPHOS 70 11/13/2018   BILITOT 0.4 11/13/2018   Lab Results  Component Value Date   HGBA1C 5.0 11/13/2018   HGBA1C 4.9 04/19/2018   HGBA1C 4.7 (L) 12/19/2017   HGBA1C 5.1 07/26/2017   Lab  Results  Component Value Date   INSULIN 14.4 11/13/2018   INSULIN 21.8 04/19/2018   INSULIN 18.0 12/19/2017   INSULIN 14.6 07/26/2017   Lab Results  Component Value Date   TSH 1.850 07/26/2017   Lab Results  Component Value Date   CHOL 222 (H) 06/12/2019   HDL 46.80 06/12/2019   LDLCALC 156 (H) 06/12/2019   TRIG 99.0 06/12/2019   CHOLHDL 5 06/12/2019   Lab Results  Component Value Date   WBC 7.1 06/12/2019   HGB 14.0 06/12/2019   HCT 41.0 06/12/2019   MCV 89.7 06/12/2019   PLT 299.0 06/12/2019   No results found for: IRON, TIBC, FERRITIN  Attestation Statements:   Reviewed by clinician on day of visit: allergies, medications, problem list, medical history, surgical history, family history, social history, and previous encounter notes.  IMarianna Payment, am acting as transcriptionist for [name], PA-C   I have reviewed the above documentation for accuracy and completeness, and I agree with the above. [name], PA-C

## 2019-06-14 LAB — COMPREHENSIVE METABOLIC PANEL
ALT: 17 IU/L (ref 0–32)
AST: 13 IU/L (ref 0–40)
Albumin/Globulin Ratio: 1.7 (ref 1.2–2.2)
Albumin: 4.4 g/dL (ref 3.8–4.8)
Alkaline Phosphatase: 70 IU/L (ref 39–117)
BUN/Creatinine Ratio: 18 (ref 9–23)
BUN: 12 mg/dL (ref 6–20)
Bilirubin Total: 0.3 mg/dL (ref 0.0–1.2)
CO2: 21 mmol/L (ref 20–29)
Calcium: 9.5 mg/dL (ref 8.7–10.2)
Chloride: 108 mmol/L — ABNORMAL HIGH (ref 96–106)
Creatinine, Ser: 0.65 mg/dL (ref 0.57–1.00)
GFR calc Af Amer: 136 mL/min/{1.73_m2} (ref >59)
GFR calc non Af Amer: 118 mL/min/{1.73_m2} (ref >59)
Globulin, Total: 2.6 g/dL (ref 1.5–4.5)
Glucose: 96 mg/dL (ref 65–99)
Potassium: 4.5 mmol/L (ref 3.5–5.2)
Sodium: 141 mmol/L (ref 134–144)
Total Protein: 7 g/dL (ref 6.0–8.5)

## 2019-06-14 LAB — HEMOGLOBIN A1C
Est. average glucose Bld gHb Est-mCnc: 91 mg/dL
Hgb A1c MFr Bld: 4.8 % (ref 4.8–5.6)

## 2019-06-14 LAB — INSULIN, RANDOM: INSULIN: 15.4 u[IU]/mL (ref 2.6–24.9)

## 2019-07-02 ENCOUNTER — Encounter: Payer: Self-pay | Admitting: Family Medicine

## 2019-07-04 ENCOUNTER — Ambulatory Visit (INDEPENDENT_AMBULATORY_CARE_PROVIDER_SITE_OTHER): Payer: No Typology Code available for payment source | Admitting: Physician Assistant

## 2019-07-09 ENCOUNTER — Encounter (INDEPENDENT_AMBULATORY_CARE_PROVIDER_SITE_OTHER): Payer: Self-pay | Admitting: Physician Assistant

## 2019-07-09 ENCOUNTER — Ambulatory Visit (INDEPENDENT_AMBULATORY_CARE_PROVIDER_SITE_OTHER): Payer: No Typology Code available for payment source | Admitting: Physician Assistant

## 2019-07-09 ENCOUNTER — Other Ambulatory Visit: Payer: Self-pay

## 2019-07-09 VITALS — BP 127/85 | HR 87 | Temp 98.2°F | Ht 63.0 in | Wt 191.0 lb

## 2019-07-09 DIAGNOSIS — E559 Vitamin D deficiency, unspecified: Secondary | ICD-10-CM | POA: Diagnosis not present

## 2019-07-09 DIAGNOSIS — Z6833 Body mass index (BMI) 33.0-33.9, adult: Secondary | ICD-10-CM

## 2019-07-09 DIAGNOSIS — E7849 Other hyperlipidemia: Secondary | ICD-10-CM

## 2019-07-09 DIAGNOSIS — Z9189 Other specified personal risk factors, not elsewhere classified: Secondary | ICD-10-CM | POA: Diagnosis not present

## 2019-07-09 DIAGNOSIS — E669 Obesity, unspecified: Secondary | ICD-10-CM

## 2019-07-09 MED ORDER — VITAMIN D (ERGOCALCIFEROL) 1.25 MG (50000 UNIT) PO CAPS: 50000.0000 [IU] | ORAL_CAPSULE | ORAL | 0 refills | Status: DC

## 2019-07-09 MED FILL — VIT D2 1.25 MG (50,000 UNIT: 1.25 MG | 28 days supply | Qty: 4 | Fill #0

## 2019-07-09 NOTE — Progress Notes (Signed)
Chief Complaint:   OBESITY Alison Davies is here to discuss her progress with her obesity treatment plan along with follow-up of her obesity related diagnoses. Alison Davies is on the Category 3 Plan and states she is following her eating plan approximately 60% of the time. Alison Davies states she is exercising 0 minutes 0 times per week.  Today's visit was #: 33 Starting weight: 199 lbs Starting date: 07/26/2017 Today's weight: 191 lbs Today's date: 07/09/2019 Total lbs lost to date: 8 Total lbs lost since last in-office visit: 0  Interim History: Alison Davies reports that she has been picking up extra shifts at work. She is thinking about starting with a meal planning service.  Subjective:   Vitamin D deficiency. Last Vitamin D 23.42.   Other hyperlipidemia. Alison Davies has hyperlipidemia and has been trying to improve her cholesterol levels with intensive lifestyle modification including a low saturated fat diet, exercise and weight loss. She denies any chest pain. Alison Davies is on no medications. Last level not at goal.  Lab Results  Component Value Date   ALT 17 06/13/2019   AST 13 06/13/2019   ALKPHOS 70 06/13/2019   BILITOT 0.3 06/13/2019   Lab Results  Component Value Date   CHOL 222 (H) 06/12/2019   HDL 46.80 06/12/2019   LDLCALC 156 (H) 06/12/2019   TRIG 99.0 06/12/2019   CHOLHDL 5 06/12/2019   At risk for heart disease. Alison Davies is at a higher than average risk for cardiovascular disease due to obesity. Reviewed: no chest pain on exertion, no dyspnea on exertion, and no swelling of ankles.  Assessment/Plan:   Vitamin D deficiency. Low Vitamin D level contributes to fatigue and are associated with obesity, breast, and colon cancer. She was given a refill on her Vitamin D, Ergocalciferol, (DRISDOL) 1.25 MG (50000 UNIT) CAPS capsule every week #4 with 0 refills and will follow-up for routine testing of Vitamin D, at least 2-3 times per year to avoid over-replacement.    Other hyperlipidemia.  Cardiovascular risk and specific lipid/LDL goals reviewed.  We discussed several lifestyle modifications today and Alison Davies will continue to work on diet, exercise and weight loss efforts. Orders and follow up as documented in patient record.   Counseling Intensive lifestyle modifications are the first line treatment for this issue. . Dietary changes: Increase soluble fiber. Decrease simple carbohydrates. . Exercise changes: Moderate to vigorous-intensity aerobic activity 150 minutes per week if tolerated. . Lipid-lowering medications: see documented in medical record.  At risk for heart disease. Alison Davies was given approximately 15 minutes of coronary artery disease prevention counseling today. She is 34 y.o. female and has risk factors for heart disease including obesity. We discussed intensive lifestyle modifications today with an emphasis on specific weight loss instructions and strategies.   Repetitive spaced learning was employed today to elicit superior memory formation and behavioral change.  Class 1 obesity with serious comorbidity and body mass index (BMI) of 33.0 to 33.9 in adult, unspecified obesity type.  Alison Davies is currently in the action stage of change. As such, her goal is to continue with weight loss efforts. She has agreed to keeping a food journal and adhering to recommended goals of 1500 calories and 95 grams of protein.   Exercise goals: For substantial health benefits, adults should do at least 150 minutes (2 hours and 30 minutes) a week of moderate-intensity, or 75 minutes (1 hour and 15 minutes) a week of vigorous-intensity aerobic physical activity, or an equivalent combination of moderate- and vigorous-intensity  aerobic activity. Aerobic activity should be performed in episodes of at least 10 minutes, and preferably, it should be spread throughout the week.  Behavioral modification strategies: meal planning and cooking strategies and keeping healthy foods in the home.  Alison Davies has  agreed to follow-up with our clinic in 3 weeks. She was informed of the importance of frequent follow-up visits to maximize her success with intensive lifestyle modifications for her multiple health conditions.   Objective:   Blood pressure 127/85, pulse 87, temperature 98.2 F (36.8 C), temperature source Oral, height 5\' 3"  (1.6 m), weight 191 lb (86.6 kg), last menstrual period 06/09/2019, SpO2 97 %. Body mass index is 33.83 kg/m.  General: Cooperative, alert, well developed, in no acute distress. HEENT: Conjunctivae and lids unremarkable. Cardiovascular: Regular rhythm.  Lungs: Normal work of breathing. Neurologic: No focal deficits.   Lab Results  Component Value Date   CREATININE 0.65 06/13/2019   BUN 12 06/13/2019   NA 141 06/13/2019   K 4.5 06/13/2019   CL 108 (H) 06/13/2019   CO2 21 06/13/2019   Lab Results  Component Value Date   ALT 17 06/13/2019   AST 13 06/13/2019   ALKPHOS 70 06/13/2019   BILITOT 0.3 06/13/2019   Lab Results  Component Value Date   HGBA1C 4.8 06/13/2019   HGBA1C 5.0 11/13/2018   HGBA1C 4.9 04/19/2018   HGBA1C 4.7 (L) 12/19/2017   HGBA1C 5.1 07/26/2017   Lab Results  Component Value Date   INSULIN 15.4 06/13/2019   INSULIN 14.4 11/13/2018   INSULIN 21.8 04/19/2018   INSULIN 18.0 12/19/2017   INSULIN 14.6 07/26/2017   Lab Results  Component Value Date   TSH 1.850 07/26/2017   Lab Results  Component Value Date   CHOL 222 (H) 06/12/2019   HDL 46.80 06/12/2019   LDLCALC 156 (H) 06/12/2019   TRIG 99.0 06/12/2019   CHOLHDL 5 06/12/2019   Lab Results  Component Value Date   WBC 7.1 06/12/2019   HGB 14.0 06/12/2019   HCT 41.0 06/12/2019   MCV 89.7 06/12/2019   PLT 299.0 06/12/2019   No results found for: IRON, TIBC, FERRITIN  Attestation Statements:   Reviewed by clinician on day of visit: allergies, medications, problem list, medical history, surgical history, family history, social history, and previous encounter  notes.  IMichaelene Song, am acting as transcriptionist for Abby Potash, PA-C   I have reviewed the above documentation for accuracy and completeness, and I agree with the above. Abby Potash, PA-C

## 2019-07-30 ENCOUNTER — Encounter (INDEPENDENT_AMBULATORY_CARE_PROVIDER_SITE_OTHER): Payer: Self-pay | Admitting: Physician Assistant

## 2019-07-30 ENCOUNTER — Ambulatory Visit (INDEPENDENT_AMBULATORY_CARE_PROVIDER_SITE_OTHER): Payer: No Typology Code available for payment source | Admitting: Physician Assistant

## 2019-07-30 ENCOUNTER — Other Ambulatory Visit: Payer: Self-pay

## 2019-07-30 VITALS — BP 110/73 | HR 72 | Temp 98.0°F | Ht 63.0 in | Wt 190.0 lb

## 2019-07-30 DIAGNOSIS — E669 Obesity, unspecified: Secondary | ICD-10-CM

## 2019-07-30 DIAGNOSIS — Z6833 Body mass index (BMI) 33.0-33.9, adult: Secondary | ICD-10-CM

## 2019-07-30 DIAGNOSIS — E7849 Other hyperlipidemia: Secondary | ICD-10-CM

## 2019-07-30 DIAGNOSIS — Z9189 Other specified personal risk factors, not elsewhere classified: Secondary | ICD-10-CM | POA: Diagnosis not present

## 2019-07-30 DIAGNOSIS — E559 Vitamin D deficiency, unspecified: Secondary | ICD-10-CM

## 2019-07-30 MED ORDER — VITAMIN D (ERGOCALCIFEROL) 1.25 MG (50000 UNIT) PO CAPS: 50000.0000 [IU] | ORAL_CAPSULE | ORAL | 0 refills | Status: DC

## 2019-07-30 NOTE — Progress Notes (Signed)
Chief Complaint:   OBESITY Alison Davies is here to discuss her progress with her obesity treatment plan along with follow-up of her obesity related diagnoses. Alison Davies is keeping a food journal and adhering to recommended goals of 1500 calories and 95 grams of protein and states she is following her eating plan approximately 80% of the time. Alison Davies states she is doing cardio/weight training 60 minutes 3 times per week.  Today's visit was #: 34 Starting weight: 199 lbs Starting date: 07/26/2017 Today's weight: 190 lbs Today's date: 07/30/2019 Total lbs lost to date: 9 Total lbs lost since last in-office visit: 1  Interim History: Alison Davies reports that she did well the first week and then fell off plan some the second week.  Subjective:   Vitamin D deficiency. Alison Davies is on prescription Vitamin D. No nausea, vomiting, or muscle weakness. Last Vitamin D 29.1 on 11/13/2018.  Other hyperlipidemia. Alison Davies has hyperlipidemia and has been trying to improve her cholesterol levels with intensive lifestyle modification including a low saturated fat diet, exercise and weight loss. She denies any chest pain. Alison Davies is on no medications. She is exercising regularly.  Lab Results  Component Value Date   ALT 17 06/13/2019   AST 13 06/13/2019   ALKPHOS 70 06/13/2019   BILITOT 0.3 06/13/2019   Lab Results  Component Value Date   CHOL 222 (H) 06/12/2019   HDL 46.80 06/12/2019   LDLCALC 156 (H) 06/12/2019   TRIG 99.0 06/12/2019   CHOLHDL 5 06/12/2019   At risk for osteoporosis. Alison Davies is at higher risk of osteopenia and osteoporosis due to Vitamin D deficiency.   Assessment/Plan:   Vitamin D deficiency. Low Vitamin D level contributes to fatigue and are associated with obesity, breast, and colon cancer. She was given a refill on her Vitamin D, Ergocalciferol, (DRISDOL) 1.25 MG (50000 UNIT) CAPS capsule every week #4 with 0 refills and will follow-up for routine testing of Vitamin D, at least 2-3  times per year to avoid over-replacement.     Other hyperlipidemia. Cardiovascular risk and specific lipid/LDL goals reviewed.  We discussed several lifestyle modifications today and Alison Davies will continue to work on diet, exercise and weight loss efforts. Orders and follow up as documented in patient record.   Counseling Intensive lifestyle modifications are the first line treatment for this issue. . Dietary changes: Increase soluble fiber. Decrease simple carbohydrates. . Exercise changes: Moderate to vigorous-intensity aerobic activity 150 minutes per week if tolerated. . Lipid-lowering medications: see documented in medical record.  At risk for osteoporosis. Alison Davies was given approximately 15 minutes of osteoporosis prevention counseling today. Alison Davies is at risk for osteopenia and osteoporosis due to her Vitamin D deficiency. She was encouraged to take her Vitamin D and follow her higher calcium diet and increase strengthening exercise to help strengthen her bones and decrease her risk of osteopenia and osteoporosis.  Repetitive spaced learning was employed today to elicit superior memory formation and behavioral change.  Class 1 obesity with serious comorbidity and body mass index (BMI) of 33.0 to 33.9 in adult, unspecified obesity type.  Alison Davies is currently in the action stage of change. As such, her goal is to continue with weight loss efforts. She has agreed to keeping a food journal and adhering to recommended goals of 1500 calories and 95 grams of protein daily.   Exercise goals: Alison Davies will continue her current exercise regimen.  Behavioral modification strategies: meal planning and cooking strategies and keeping healthy foods in the home.  Alison Davies has agreed to follow-up with our clinic in 3 weeks. She was informed of the importance of frequent follow-up visits to maximize her success with intensive lifestyle modifications for her multiple health conditions.   Objective:   Blood pressure  110/73, pulse 72, temperature 98 F (36.7 C), temperature source Oral, height 5\' 3"  (1.6 m), weight 190 lb (86.2 kg), SpO2 98 %. Body mass index is 33.66 kg/m.  General: Cooperative, alert, well developed, in no acute distress. HEENT: Conjunctivae and lids unremarkable. Cardiovascular: Regular rhythm.  Lungs: Normal work of breathing. Neurologic: No focal deficits.   Lab Results  Component Value Date   CREATININE 0.65 06/13/2019   BUN 12 06/13/2019   NA 141 06/13/2019   K 4.5 06/13/2019   CL 108 (H) 06/13/2019   CO2 21 06/13/2019   Lab Results  Component Value Date   ALT 17 06/13/2019   AST 13 06/13/2019   ALKPHOS 70 06/13/2019   BILITOT 0.3 06/13/2019   Lab Results  Component Value Date   HGBA1C 4.8 06/13/2019   HGBA1C 5.0 11/13/2018   HGBA1C 4.9 04/19/2018   HGBA1C 4.7 (L) 12/19/2017   HGBA1C 5.1 07/26/2017   Lab Results  Component Value Date   INSULIN 15.4 06/13/2019   INSULIN 14.4 11/13/2018   INSULIN 21.8 04/19/2018   INSULIN 18.0 12/19/2017   INSULIN 14.6 07/26/2017   Lab Results  Component Value Date   TSH 1.850 07/26/2017   Lab Results  Component Value Date   CHOL 222 (H) 06/12/2019   HDL 46.80 06/12/2019   LDLCALC 156 (H) 06/12/2019   TRIG 99.0 06/12/2019   CHOLHDL 5 06/12/2019   Lab Results  Component Value Date   WBC 7.1 06/12/2019   HGB 14.0 06/12/2019   HCT 41.0 06/12/2019   MCV 89.7 06/12/2019   PLT 299.0 06/12/2019   No results found for: IRON, TIBC, FERRITIN  Attestation Statements:   Reviewed by clinician on day of visit: allergies, medications, problem list, medical history, surgical history, family history, social history, and previous encounter notes.  IMichaelene Song, am acting as transcriptionist for Abby Potash, PA-C   I have reviewed the above documentation for accuracy and completeness, and I agree with the above. Abby Potash, PA-C

## 2019-07-31 MED FILL — VIT D2 1.25 MG (50,000 UNIT: 1.25 MG | 28 days supply | Qty: 4 | Fill #0

## 2019-08-07 ENCOUNTER — Encounter: Payer: Self-pay | Admitting: Obstetrics & Gynecology

## 2019-08-07 ENCOUNTER — Ambulatory Visit (INDEPENDENT_AMBULATORY_CARE_PROVIDER_SITE_OTHER): Payer: No Typology Code available for payment source | Admitting: Obstetrics & Gynecology

## 2019-08-07 ENCOUNTER — Other Ambulatory Visit: Payer: Self-pay

## 2019-08-07 ENCOUNTER — Other Ambulatory Visit (HOSPITAL_COMMUNITY)
Admission: RE | Admit: 2019-08-07 | Discharge: 2019-08-07 | Disposition: A | Discharge status: Home / Self Care | Payer: No Typology Code available for payment source | Source: Ambulatory Visit | Attending: Obstetrics & Gynecology | Admitting: Obstetrics & Gynecology

## 2019-08-07 VITALS — BP 119/77 | HR 74 | Ht 64.0 in | Wt 191.0 lb

## 2019-08-07 DIAGNOSIS — R87612 Low grade squamous intraepithelial lesion on cytologic smear of cervix (LGSIL): Secondary | ICD-10-CM

## 2019-08-07 DIAGNOSIS — Z01812 Encounter for preprocedural laboratory examination: Secondary | ICD-10-CM

## 2019-08-07 DIAGNOSIS — Z3202 Encounter for pregnancy test, result negative: Secondary | ICD-10-CM

## 2019-08-07 LAB — POCT URINE PREGNANCY: Preg Test, Ur: NEGATIVE

## 2019-08-07 NOTE — Progress Notes (Signed)
History:  34 y.o. G0P0000 here today for eval of abnormal PAP from 06/2019  The following portions of the patient's history were reviewed and updated as appropriate: allergies, current medications, past family history, past medical history, past social history, past surgical history and problem list.  Review of Systems:  Pertinent items are noted in HPI.    Objective:  Physical Exam BP 119/77   Pulse 74   Ht 5\' 4"  (1.626 m)   Wt 191 lb (86.6 kg)   LMP 06/09/2019 (Exact Date) Comment: takes OCP continously  BMI 32.79 kg/m   CONSTITUTIONAL: Well-developed, well-nourished female in no acute distress.  HENT:  Normocephalic, atraumatic EYES: Conjunctivae and EOM are normal. No scleral icterus.  NECK: Normal range of motion SKIN: Skin is warm and dry. No rash noted. Not diaphoretic.No pallor. NEUROLGIC: Alert and oriented to person, place, and time. Normal coordination.   GYN procedure  Patient given informed consent, signed copy in the chart, time out was performed.  Placed in lithotomy position. Cervix viewed with speculum and colposcope after application of acetic acid.   Colposcopy adequate? yes Acetowhite lesions? yes Punctation? no Mosaicism? No Abnormal vasculature? No Biopsies? yes ECC? no  Labs and Imaging 06/12/2019 PositiveAbnormal     Neisseria Gonorrhea Negative   Chlamydia Negative   Adequacy Satisfactory for evaluation; transformation zone component PRESENT.   Diagnosis - Low grade squamous intraepithelial lesion (LSIL)Abnormal    Comment Normal Reference Range HPV - Negative   Comment Normal Reference Ranger Chlamydia - Negative   Comment Normal Reference Range Neisseria Gonorrhea - Negative      Assessment & Plan:    Abnormal PAP- LGSIL with hrHPV s/p colpo Patient was given post procedure instructions. Results will be sent to pt via MyChart unless abnormal   Fermin Yan L. Harraway-Smith, M.D., 08/10/2019

## 2019-08-07 NOTE — Patient Instructions (Signed)
Colposcopy Colposcopy is a procedure to examine the lowest part of the uterus (cervix), the vagina, and the area around the vaginal opening (vulva) for abnormalities or signs of disease. The procedure is done using a lighted microscope or magnifying lens (colposcope). If any unusual cells are found during the procedure, your health care provider may remove a tissue sample for testing (biopsy). A colposcopy may be done if you:  Have an abnormal Pap test. A Pap test is a screening test that is used to check for signs of cancer or infection of the vagina, cervix, and uterus.  Have a Pap smear test in which you test positive for high-risk HPV (human papillomavirus).  Have a sore or lesion on your cervix.  Have genital warts on your vulva, vagina, or cervix.  Took certain medicines while pregnant, such as diethylstilbestrol (DES).  Have pain during sexual intercourse.  Have vaginal bleeding, especially after sexual intercourse.  Need to have a cervical polyp removed.  Need to have a lost intrauterine device (IUD) string located. Let your health care provider know about:  Any allergies you have, including allergies to prescribed medicine, latex, or iodine.  All medicines you are taking, including vitamins, herbs, eye drops, creams, and over-the-counter medicines. Bring a list of all of your medicines to your appointment.  Any problems you or family members have had with anesthetic medicines.  Any blood disorders you have.  Any surgeries you have had.  Any medical conditions you have, such as pelvic inflammatory disease (PID) or endometrial disorder.  Any history of frequent fainting.  Your menstrual cycle and what form of birth control (contraception) you use.  Your medical history, including any prior cervical treatment.  Whether you are pregnant or may be pregnant. What are the risks? Generally, this is a safe procedure. However, problems may occur, including:  Pain.   Infection, which may include a fever, bad-smelling discharge, or pelvic pain.  Bleeding or discharge.  Misdiagnosis.  Fainting and vasovagal reactions, but this is rare.  Allergic reactions to medicines.  Damage to other structures or organs. What happens before the procedure?  If you have your menstrual period or will have it at the time of your procedure, tell your health care provider. A colposcopy typically is not done during menstruation.  Continue your contraceptive practices before and after the procedure.  For 24 hours before the colposcopy: ? Do not douche. ? Do not use tampons. ? Do not use medicines, creams, or suppositories in the vagina. ? Do not have sexual intercourse.  Ask your health care provider about: ? Changing or stopping your regular medicines. This is especially important if you are taking diabetes medicines or blood thinners. ? Taking medicines such as aspirin and ibuprofen. These medicines can thin your blood. Do not take these medicines before your procedure if your health care provider instructs you not to. It is likely that your health care provider will tell you to avoid taking aspirin or medicine that contains aspirin for 7 days before the procedure.  Follow instructions from your health care provider about eating or drinking restrictions. You will likely need to eat a regular diet the day of the procedure and not skip any meals.  You may have an exam or testing. A pregnancy test will be taken on the day of the procedure.  You may have a blood or urine sample taken.  Plan to have someone take you home from the hospital or clinic.  If you will be going  home right after the procedure, plan to have someone with you for 24 hours. What happens during the procedure?  You will lie down on your back, with your feet in foot rests (stirrups).  A warmed and lubricated instrument (speculum) will be inserted into your vagina. The speculum will be used to hold  apart the walls of your vagina so your health care provider can see your cervix and the inside of your vagina.  A cotton swab will be used to place a small amount of liquid solution on the areas to be examined. This solution makes it easier to see abnormal cells. You may feel a slight burning during this part.  The colposcope will be used to scan the cervix with a bright white light. The colposcope will be held near your vulvaand will magnify your vulva, vagina, and cervix for easier examination.  Your health care provider may decide to take a biopsy. If so: ? You may be given medicine to numb the area (local anesthetic). ? Surgical instruments will be used to suck out mucus and cells through your vagina. ? You may feel mild pain while the tissue sample is removed. ? Bleeding may occur. A solution may be used to stop the bleeding. ? If a sample of tissue is needed from the inside of the cervix, a different procedure called endocervical curettage (ECC) may be completed. During this procedure, a curved instrument (curette) will be used to scrape cells from your cervix or the top of your cervix (endocervix).  Your health care provider will record the location of any abnormalities. The procedure may vary among health care providers and hospitals. What happens after the procedure?  You will lie down and rest for a few minutes. You may be offered juice or cookies.  Your blood pressure, heart rate, breathing rate, and blood oxygen level will be monitored until any medicines you were given have worn off.  You may have to wear compression stockings. These stockings help to prevent blood clots and reduce swelling in your legs.  You may have some cramping in your abdomen. This should go away after a few minutes. This information is not intended to replace advice given to you by your health care provider. Make sure you discuss any questions you have with your health care provider. Document Revised:  03/31/2017 Document Reviewed: 11/23/2015 Elsevier Patient Education  [place] [employer] This sheet gives you information about how to care for yourself after your procedure. Your doctor may also give you more specific instructions. If you have problems or questions, contact your doctor. What can I expect after the procedure? If you did not have a tissue sample removed (did not have a biopsy), you may only have some spotting for a few days. You can go back to your normal activities. If you had a tissue sample removed, it is common to have:  Soreness and pain. This may last for a few days.  Light-headedness.  Mild bleeding from your vagina or dark-colored, grainy discharge from your vagina. This may last for a few days. You may need to wear a sanitary pad.  Spotting for at least 48 hours after the procedure. Follow these instructions at home:   Take over-the-counter and prescription medicines only as told by your doctor. Ask your doctor what medicines you can start taking again. This is very important if you take blood-thinning medicine.  Do not drive or use heavy machinery while taking prescription pain medicine.  For 3  days, or as long as your doctor tells you, avoid: ? Douching. ? Using tampons. ? Having sex.  If you use birth control (contraception), keep using it.  Limit activity for the first day after the procedure. Ask your doctor what activities are safe for you.  It is up to you to get the results of your procedure. Ask your doctor when your results will be ready.  Keep all follow-up visits as told by your doctor. This is important. Contact a doctor if:  You get a skin rash. Get help right away if:  You are bleeding a lot from your vagina. It is a lot of bleeding if you are using more than one pad an hour for 2 hours in a row.  You have clumps of blood (blood clots) coming from your vagina.  You have a fever.  You have chills  You have  pain in your lower belly (pelvic area).  You have signs of infection, such as vaginal discharge that is: ? Different than usual. ? Yellow. ? Bad-smelling.  You have very pain or cramps in your lower belly that do not get better with medicine.  You feel light-headed.  You feel dizzy.  You pass out (faint). Summary  If you did not have a tissue sample removed (did not have a biopsy), you may only have some spotting for a few days. You can go back to your normal activities.  If you had a tissue sample removed, it is common to have mild pain and spotting for 48 hours.  For 3 days, or as long as your doctor tells you, avoid douching, using tampons and having sex.  Get help right away if you have bleeding, very bad pain, or signs of infection. This information is not intended to replace advice given to you by your health care provider. Make sure you discuss any questions you have with your health care provider. Document Revised: 03/31/2017 Document Reviewed: 01/06/2016 Elsevier Patient Education  2020 [employer].

## 2019-08-09 LAB — SURGICAL PATHOLOGY

## 2019-08-20 ENCOUNTER — Encounter (INDEPENDENT_AMBULATORY_CARE_PROVIDER_SITE_OTHER): Payer: Self-pay | Admitting: Physician Assistant

## 2019-08-20 ENCOUNTER — Ambulatory Visit (INDEPENDENT_AMBULATORY_CARE_PROVIDER_SITE_OTHER): Payer: No Typology Code available for payment source | Admitting: Physician Assistant

## 2019-08-20 ENCOUNTER — Other Ambulatory Visit: Payer: Self-pay

## 2019-08-20 VITALS — BP 111/74 | HR 75 | Temp 98.2°F | Ht 64.0 in | Wt 188.0 lb

## 2019-08-20 DIAGNOSIS — E669 Obesity, unspecified: Secondary | ICD-10-CM

## 2019-08-20 DIAGNOSIS — Z6833 Body mass index (BMI) 33.0-33.9, adult: Secondary | ICD-10-CM

## 2019-08-20 DIAGNOSIS — E7849 Other hyperlipidemia: Secondary | ICD-10-CM | POA: Diagnosis not present

## 2019-08-20 NOTE — Progress Notes (Signed)
Chief Complaint:   OBESITY Alison Davies is here to discuss her progress with her obesity treatment plan along with follow-up of her obesity related diagnoses. Alison Davies is keeping a food journal and adhering to recommended goals of 1500 calories and 95 grams of protein and states she is following her eating plan approximately 70% of the time. [name] states she is walking/weightlifting 60 minutes 3 times per week.  Today's visit was #: 45 Starting weight: 199 lbs Starting date: 07/26/2017 Today's weight: 188 lbs Today's date: 08/20/2019 Total lbs lost to date: 11 Total lbs lost since last in-office visit: 2  Interim History: [name] states that she journaled 75% of the time. When she gets off track, she gets back on the next meal.  Subjective:   Other hyperlipidemia. Alison Davies has hyperlipidemia and has been trying to improve her cholesterol levels with intensive lifestyle modification including a low saturated fat diet, exercise and weight loss. She denies any chest pain or headache. Alison Davies is on no medications. She is exercising regularly.  Lab Results  Component Value Date   ALT 17 06/13/2019   AST 13 06/13/2019   ALKPHOS 70 06/13/2019   BILITOT 0.3 06/13/2019   Lab Results  Component Value Date   CHOL 222 (H) 06/12/2019   HDL 46.80 06/12/2019   LDLCALC 156 (H) 06/12/2019   TRIG 99.0 06/12/2019   CHOLHDL 5 06/12/2019   Assessment/Plan:   Other hyperlipidemia. Cardiovascular risk and specific lipid/LDL goals reviewed.  We discussed several lifestyle modifications today and Alison Davies will continue to work on diet, exercise and weight loss efforts. Orders and follow up as documented in patient record.   Counseling Intensive lifestyle modifications are the first line treatment for this issue. . Dietary changes: Increase soluble fiber. Decrease simple carbohydrates. . Exercise changes: Moderate to vigorous-intensity aerobic activity 150 minutes per week if tolerated. . Lipid-lowering  medications: see documented in medical record.  Class 1 obesity with serious comorbidity and body mass index (BMI) of 33.0 to 33.9 in adult, unspecified obesity type.  Alison Davies is currently in the action stage of change. As such, her goal is to continue with weight loss efforts. She has agreed to keeping a food journal and adhering to recommended goals of 1500 calories and 95 grams of protein.   Exercise goals: For substantial health benefits, adults should do at least 150 minutes (2 hours and 30 minutes) a week of moderate-intensity, or 75 minutes (1 hour and 15 minutes) a week of vigorous-intensity aerobic physical activity, or an equivalent combination of moderate- and vigorous-intensity aerobic activity. Aerobic activity should be performed in episodes of at least 10 minutes, and preferably, it should be spread throughout the week.  Behavioral modification strategies: no skipping meals and meal planning and cooking strategies.  Alison Davies has agreed to follow-up with our clinic in 3 weeks. She was informed of the importance of frequent follow-up visits to maximize her success with intensive lifestyle modifications for her multiple health conditions.   Objective:   Blood pressure 111/74, pulse 75, temperature 98.2 F (36.8 C), temperature source Oral, height 5\' 4"  (1.626 m), weight 188 lb (85.3 kg), SpO2 98 %. Body mass index is 32.27 kg/m.  General: Cooperative, alert, well developed, in no acute distress. HEENT: Conjunctivae and lids unremarkable. Cardiovascular: Regular rhythm.  Lungs: Normal work of breathing. Neurologic: No focal deficits.   Lab Results  Component Value Date   CREATININE 0.65 06/13/2019   BUN 12 06/13/2019   NA 141 06/13/2019  K 4.5 06/13/2019   CL 108 (H) 06/13/2019   CO2 21 06/13/2019   Lab Results  Component Value Date   ALT 17 06/13/2019   AST 13 06/13/2019   ALKPHOS 70 06/13/2019   BILITOT 0.3 06/13/2019   Lab Results  Component Value Date   HGBA1C  4.8 06/13/2019   HGBA1C 5.0 11/13/2018   HGBA1C 4.9 04/19/2018   HGBA1C 4.7 (L) 12/19/2017   HGBA1C 5.1 07/26/2017   Lab Results  Component Value Date   INSULIN 15.4 06/13/2019   INSULIN 14.4 11/13/2018   INSULIN 21.8 04/19/2018   INSULIN 18.0 12/19/2017   INSULIN 14.6 07/26/2017   Lab Results  Component Value Date   TSH 1.850 07/26/2017   Lab Results  Component Value Date   CHOL 222 (H) 06/12/2019   HDL 46.80 06/12/2019   LDLCALC 156 (H) 06/12/2019   TRIG 99.0 06/12/2019   CHOLHDL 5 06/12/2019   Lab Results  Component Value Date   WBC 7.1 06/12/2019   HGB 14.0 06/12/2019   HCT 41.0 06/12/2019   MCV 89.7 06/12/2019   PLT 299.0 06/12/2019   No results found for: IRON, TIBC, FERRITIN  Attestation Statements:   Reviewed by clinician on day of visit: allergies, medications, problem list, medical history, surgical history, family history, social history, and previous encounter notes.  Time spent on visit including pre-visit chart review and post-visit charting and care was 33 minutes.   IMarianna Payment, am acting as transcriptionist for [name], PA-C   I have reviewed the above documentation for accuracy and completeness, and I agree with the above. [name], PA-C

## 2019-08-26 MED FILL — TOPIRAMATE 100 MG TABLET: 100 | 90 days supply | Qty: 90 | Fill #0

## 2019-08-26 MED FILL — SERTRALINE HCL 100 MG TABS: 100 | 90 days supply | Qty: 90 | Fill #0

## 2019-09-10 ENCOUNTER — Ambulatory Visit (INDEPENDENT_AMBULATORY_CARE_PROVIDER_SITE_OTHER): Payer: No Typology Code available for payment source | Admitting: Physician Assistant

## 2019-09-10 ENCOUNTER — Other Ambulatory Visit (INDEPENDENT_AMBULATORY_CARE_PROVIDER_SITE_OTHER): Payer: Self-pay | Admitting: Physician Assistant

## 2019-09-10 ENCOUNTER — Other Ambulatory Visit: Payer: Self-pay | Admitting: Family Medicine

## 2019-09-10 ENCOUNTER — Other Ambulatory Visit: Payer: Self-pay

## 2019-09-10 VITALS — BP 104/71 | HR 72 | Temp 98.3°F | Ht 64.0 in | Wt 186.0 lb

## 2019-09-10 DIAGNOSIS — E66811 Obesity, class 1: Secondary | ICD-10-CM

## 2019-09-10 DIAGNOSIS — Z9189 Other specified personal risk factors, not elsewhere classified: Secondary | ICD-10-CM

## 2019-09-10 DIAGNOSIS — E7849 Other hyperlipidemia: Secondary | ICD-10-CM | POA: Diagnosis not present

## 2019-09-10 DIAGNOSIS — E669 Obesity, unspecified: Secondary | ICD-10-CM | POA: Diagnosis not present

## 2019-09-10 DIAGNOSIS — E559 Vitamin D deficiency, unspecified: Secondary | ICD-10-CM

## 2019-09-10 DIAGNOSIS — Z6832 Body mass index (BMI) 32.0-32.9, adult: Secondary | ICD-10-CM

## 2019-09-10 MED ORDER — VITAMIN D (ERGOCALCIFEROL) 1.25 MG (50000 UNIT) PO CAPS: 50000.0000 [IU] | ORAL_CAPSULE | ORAL | 0 refills | Status: DC

## 2019-09-10 NOTE — Telephone Encounter (Signed)
Last OV 06/12/19 Last fill 06/07/18  #30/3

## 2019-09-10 NOTE — Progress Notes (Signed)
Chief Complaint:   OBESITY Alison Davies is here to discuss her progress with her obesity treatment plan along with follow-up of her obesity related diagnoses. Alison Davies is keeping a food journal and adhering to recommended goals of 1500 calories and 95 grams of protein and states she is following her eating plan approximately 90% of the time. Alison Davies states she is weight lifting 120 minutes 3 times per week.  Today's visit was #: 36 Starting weight: 199 lbs Starting date: 07/26/2017 Today's weight: 186 lbs Today's date: 09/10/2019 Total lbs lost to date: 13 Total lbs lost since last in-office visit: 2  Interim History: Alison Davies has been journaling more consistently and is eating between 1500-1700 calories daily and averaging 100 grams of protein daily.  Subjective:   Vitamin D deficiency. Alison Davies is on prescription Vitamin D weekly. No nausea, vomiting, or muscle weakness. Last Vitamin D 23.42 on 06/12/2019.  Other hyperlipidemia. Alison Davies has hyperlipidemia and has been trying to improve her cholesterol levels with intensive lifestyle modification including a low saturated fat diet, exercise and weight loss. She denies any chest pain. She is on no medication. She is exercising regularly.  Lab Results  Component Value Date   ALT 17 06/13/2019   AST 13 06/13/2019   ALKPHOS 70 06/13/2019   BILITOT 0.3 06/13/2019   Lab Results  Component Value Date   CHOL 222 (H) 06/12/2019   HDL 46.80 06/12/2019   LDLCALC 156 (H) 06/12/2019   TRIG 99.0 06/12/2019   CHOLHDL 5 06/12/2019   At risk for heart disease. Alison Davies is at a higher than average risk for cardiovascular disease due to obesity.   Assessment/Plan:   Vitamin D deficiency. Low Vitamin D level contributes to fatigue and are associated with obesity, breast, and colon cancer. She was given a refill on her Vitamin D, Ergocalciferol, (DRISDOL) 1.25 MG (50000 UNIT) CAPS capsule every week #4 with 0 refills and will follow-up for routine  testing of Vitamin D, at least 2-3 times per year to avoid over-replacement.    Other hyperlipidemia. Cardiovascular risk and specific lipid/LDL goals reviewed.  We discussed several lifestyle modifications today and Alison Davies will continue to work on diet, exercise and weight loss efforts. Orders and follow up as documented in patient record.   Counseling Intensive lifestyle modifications are the first line treatment for this issue. . Dietary changes: Increase soluble fiber. Decrease simple carbohydrates. . Exercise changes: Moderate to vigorous-intensity aerobic activity 150 minutes per week if tolerated. . Lipid-lowering medications: see documented in medical record.  At risk for heart disease. Alison Davies was given approximately 15 minutes of coronary artery disease prevention counseling today. She is 34 y.o. female and has risk factors for heart disease including obesity. We discussed intensive lifestyle modifications today with an emphasis on specific weight loss instructions and strategies.   Repetitive spaced learnin  g was employed today to elicit superior memory formation and behavioral change.   Class 1 obesity with serious comorbidity and body mass index (BMI) of 32.0 to 32.9 in adult, unspecified obesity type.  Alison Davies is currently in the action stage of change. As such, her goal is to continue with weight loss efforts. She has agreed to keeping a food journal and adhering to recommended goals of 1500 calories and 95 grams of protein daily.   Exercise goals: For substantial health benefits, adults should do at least 150 minutes (2 hours and 30 minutes) a week of moderate-intensity, or 75 minutes (1 hour and 15 minutes) a  week of vigorous-intensity aerobic physical activity, or an equivalent combination of moderate- and vigorous-intensity aerobic activity. Aerobic activity should be performed in episodes of at least 10 minutes, and preferably, it should be spread throughout the week.  Behavioral  modification strategies: planning for success and keeping a strict food journal.  Alison Davies has agreed to follow-up with our clinic in 3 weeks. She was informed of the importance of frequent follow-up visits to maximize her success with intensive lifestyle modifications for her multiple health conditions.   Objective:   Blood pressure 104/71, pulse 72, temperature 98.3 F (36.8 C), temperature source Oral, height 5\' 4"  (1.626 m), weight 186 lb (84.4 kg), SpO2 98 %. Body mass index is 31.93 kg/m.  General: Cooperative, alert, well developed, in no acute distress. HEENT: Conjunctivae and lids unremarkable. Cardiovascular: Regular rhythm.  Lungs: Normal work of breathing. Neurologic: No focal deficits.   Lab Results  Component Value Date   CREATININE 0.65 06/13/2019   BUN 12 06/13/2019   NA 141 06/13/2019   K 4.5 06/13/2019   CL 108 (H) 06/13/2019   CO2 21 06/13/2019   Lab Results  Component Value Date   ALT 17 06/13/2019   AST 13 06/13/2019   ALKPHOS 70 06/13/2019   BILITOT 0.3 06/13/2019   Lab Results  Component Value Date   HGBA1C 4.8 06/13/2019   HGBA1C 5.0 11/13/2018   HGBA1C 4.9 04/19/2018   HGBA1C 4.7 (L) 12/19/2017   HGBA1C 5.1 07/26/2017   Lab Results  Component Value Date   INSULIN 15.4 06/13/2019   INSULIN 14.4 11/13/2018   INSULIN 21.8 04/19/2018   INSULIN 18.0 12/19/2017   INSULIN 14.6 07/26/2017   Lab Results  Component Value Date   TSH 1.850 07/26/2017   Lab Results  Component Value Date   CHOL 222 (H) 06/12/2019   HDL 46.80 06/12/2019   LDLCALC 156 (H) 06/12/2019   TRIG 99.0 06/12/2019   CHOLHDL 5 06/12/2019   Lab Results  Component Value Date   WBC 7.1 06/12/2019   HGB 14.0 06/12/2019   HCT 41.0 06/12/2019   MCV 89.7 06/12/2019   PLT 299.0 06/12/2019   No results found for: IRON, TIBC, FERRITIN  Attestation Statements:   Reviewed by clinician on day of visit: allergies, medications, problem list, medical history, surgical history,  family history, social history, and previous encounter notes.  IMichaelene Song, am acting as transcriptionist for Abby Potash, PA-C   I have reviewed the above documentation for accuracy and completeness, and I agree with the above. Abby Potash, PA-C

## 2019-10-01 ENCOUNTER — Other Ambulatory Visit: Payer: Self-pay

## 2019-10-01 ENCOUNTER — Ambulatory Visit (INDEPENDENT_AMBULATORY_CARE_PROVIDER_SITE_OTHER): Payer: No Typology Code available for payment source | Admitting: Physician Assistant

## 2019-10-01 VITALS — BP 118/75 | HR 85 | Temp 98.3°F | Ht 64.0 in | Wt 185.0 lb

## 2019-10-01 DIAGNOSIS — E7849 Other hyperlipidemia: Secondary | ICD-10-CM | POA: Diagnosis not present

## 2019-10-01 DIAGNOSIS — E559 Vitamin D deficiency, unspecified: Secondary | ICD-10-CM

## 2019-10-01 DIAGNOSIS — E669 Obesity, unspecified: Secondary | ICD-10-CM | POA: Diagnosis not present

## 2019-10-01 DIAGNOSIS — Z6831 Body mass index (BMI) 31.0-31.9, adult: Secondary | ICD-10-CM

## 2019-10-01 DIAGNOSIS — Z9189 Other specified personal risk factors, not elsewhere classified: Secondary | ICD-10-CM | POA: Diagnosis not present

## 2019-10-01 MED ORDER — VITAMIN D (ERGOCALCIFEROL) 1.25 MG (50000 UNIT) PO CAPS: 50000.0000 [IU] | ORAL_CAPSULE | ORAL | 0 refills | Status: DC

## 2019-10-01 MED FILL — VIT D2 1.25 MG (50,000 UNIT: 1.25 MG | 28 days supply | Qty: 4 | Fill #0

## 2019-10-01 NOTE — Progress Notes (Signed)
Chief Complaint:   OBESITY Alison Davies is here to discuss her progress with her obesity treatment plan along with follow-up of her obesity related diagnoses. Alison Davies is keeping a food journal and adhering to recommended goals of 1500 calories and 95 grams of protein and states she is following her eating plan approximately 90% of the time. Alison Davies states she is weightlifting/walking 30 minutes 3 times per week.  Today's visit was #: 37 Starting weight: 199 lbs Starting date: 07/26/2017 Today's weight: 185 lbs Today's date: 10/01/2019 Total lbs lost to date: 14 Total lbs lost since last in-office visit: 1  Interim History: Yeraldy states that she participated in some work events recently and indulged in pizza and ribs. She has done a great job otherwise staying on plan and exercising.  Subjective:   Vitamin D deficiency. No nausea, vomiting, or muscle weakness. Alison Davies is on weekly prescription Vitamin D. Last Vitamin D 23.42 on 06/12/2019.  Other hyperlipidemia. Alison Davies has hyperlipidemia and has been trying to improve her cholesterol levels with intensive lifestyle modification including a low saturated fat diet, exercise and weight loss. She denies any chest pain. Alison Davies is on no medications.  Lab Results  Component Value Date   ALT 17 06/13/2019   AST 13 06/13/2019   ALKPHOS 70 06/13/2019   BILITOT 0.3 06/13/2019   Lab Results  Component Value Date   CHOL 222 (H) 06/12/2019   HDL 46.80 06/12/2019   LDLCALC 156 (H) 06/12/2019   TRIG 99.0 06/12/2019   CHOLHDL 5 06/12/2019   At risk for osteoporosis. Alison Davies is at higher risk of osteopenia and osteoporosis due to Vitamin D deficiency.   Assessment/Plan:   Vitamin D deficiency. Low Vitamin D level contributes to fatigue and are associated with obesity, breast, and colon cancer. She was given a refill on her Vitamin D, Ergocalciferol, (DRISDOL) 1.25 MG (50000 UNIT) CAPS capsule every week #4 with 0 refills and will follow-up for  routine testing of Vitamin D, at least 2-3 times per year to avoid over-replacement.    Other hyperlipidemia. Cardiovascular risk and specific lipid/LDL goals reviewed.  We discussed several lifestyle modifications today and Alison Davies will continue to work on diet, exercise and weight loss efforts. Orders and follow up as documented in patient record.   Counseling Intensive lifestyle modifications are the first line treatment for this issue. . Dietary changes: Increase soluble fiber. Decrease simple carbohydrates. . Exercise changes: Moderate to vigorous-intensity aerobic activity 150 minutes per week if tolerated. . Lipid-lowering medications: see documented in medical record.  At risk for osteoporosis. Alison Davies was given approximately 15 minutes of osteoporosis prevention counseling today. Alison Davies is at risk for osteopenia and osteoporosis due to her Vitamin D deficiency. She was encouraged to take her Vitamin D and follow her higher calcium diet and increase strengthening exercise to help strengthen her bones and decrease her risk of osteopenia and osteoporosis.  Repetitive spaced learning was employed today to elicit superior memory formation and behavioral change.  Class 1 obesity with serious comorbidity and body mass index (BMI) of 31.0 to 31.9 in adult, unspecified obesity type.  Alison Davies is currently in the action stage of change. As such, her goal is to continue with weight loss efforts. She has agreed to keeping a food journal and adhering to recommended goals of 1400-1500 calories and 95 grams of protein daily.   Exercise goals: For substantial health benefits, adults should do at least 150 minutes (2 hours and 30 minutes) a week of  moderate-intensity, or 75 minutes (1 hour and 15 minutes) a week of vigorous-intensity aerobic physical activity, or an equivalent combination of moderate- and vigorous-intensity aerobic activity. Aerobic activity should be performed in episodes of at least 10 minutes,  and preferably, it should be spread throughout the week.  Behavioral modification strategies: planning for success and keeping a strict food journal.  Alison Davies has agreed to follow-up with our clinic in 2 weeks. She was informed of the importance of frequent follow-up visits to maximize her success with intensive lifestyle modifications for her multiple health conditions.   Objective:   Blood pressure 118/75, pulse 85, temperature 98.3 F (36.8 C), height 5\' 4"  (1.626 m), weight 185 lb (83.9 kg), SpO2 96 %. Body mass index is 31.76 kg/m.  General: Cooperative, alert, well developed, in no acute distress. HEENT: Conjunctivae and lids unremarkable. Cardiovascular: Regular rhythm.  Lungs: Normal work of breathing. Neurologic: No focal deficits.   Lab Results  Component Value Date   CREATININE 0.65 06/13/2019   BUN 12 06/13/2019   NA 141 06/13/2019   K 4.5 06/13/2019   CL 108 (H) 06/13/2019   CO2 21 06/13/2019   Lab Results  Component Value Date   ALT 17 06/13/2019   AST 13 06/13/2019   ALKPHOS 70 06/13/2019   BILITOT 0.3 06/13/2019   Lab Results  Component Value Date   HGBA1C 4.8 06/13/2019   HGBA1C 5.0 11/13/2018   HGBA1C 4.9 04/19/2018   HGBA1C 4.7 (L) 12/19/2017   HGBA1C 5.1 07/26/2017   Lab Results  Component Value Date   INSULIN 15.4 06/13/2019   INSULIN 14.4 11/13/2018   INSULIN 21.8 04/19/2018   INSULIN 18.0 12/19/2017   INSULIN 14.6 07/26/2017   Lab Results  Component Value Date   TSH 1.850 07/26/2017   Lab Results  Component Value Date   CHOL 222 (H) 06/12/2019   HDL 46.80 06/12/2019   LDLCALC 156 (H) 06/12/2019   TRIG 99.0 06/12/2019   CHOLHDL 5 06/12/2019   Lab Results  Component Value Date   WBC 7.1 06/12/2019   HGB 14.0 06/12/2019   HCT 41.0 06/12/2019   MCV 89.7 06/12/2019   PLT 299.0 06/12/2019   No results found for: IRON, TIBC, FERRITIN  Attestation Statements:   Reviewed by clinician on day of visit: allergies, medications,  problem list, medical history, surgical history, family history, social history, and previous encounter notes.  IMichaelene Song, am acting as transcriptionist for Abby Potash, PA-C   I have reviewed the above documentation for accuracy and completeness, and I agree with the above. Abby Potash, PA-C

## 2019-10-17 ENCOUNTER — Other Ambulatory Visit (INDEPENDENT_AMBULATORY_CARE_PROVIDER_SITE_OTHER): Payer: Self-pay | Admitting: Obstetrics & Gynecology

## 2019-10-17 DIAGNOSIS — Z30011 Encounter for initial prescription of contraceptive pills: Secondary | ICD-10-CM

## 2019-10-18 MED ORDER — DROSPIRENONE-ETHINYL ESTRADIOL 3-0.02 MG OR TABS
ORAL_TABLET | ORAL | 0 refills | Status: DC
Start: 2019-10-18 — End: 2020-02-06

## 2019-10-18 NOTE — Telephone Encounter (Signed)
eCare message sent to pt.     If read Close TE, if not contact pt.      Postponing TE.

## 2019-10-18 NOTE — Telephone Encounter (Signed)
Patient is due for yearly exam.  One refill authorized.  Please schedule follow up visit.

## 2019-10-21 NOTE — Telephone Encounter (Signed)
Called Pt and LVM informing pt medication has been refilled and sent to pharmacy. Any questions pt can call clinic.     Closing TE - no further action needed.

## 2019-10-23 ENCOUNTER — Ambulatory Visit (INDEPENDENT_AMBULATORY_CARE_PROVIDER_SITE_OTHER): Payer: No Typology Code available for payment source | Admitting: Physician Assistant

## 2019-10-23 ENCOUNTER — Encounter (INDEPENDENT_AMBULATORY_CARE_PROVIDER_SITE_OTHER): Payer: Self-pay | Admitting: Physician Assistant

## 2019-10-23 ENCOUNTER — Other Ambulatory Visit: Payer: Self-pay

## 2019-10-23 VITALS — BP 111/69 | HR 61 | Temp 98.2°F | Ht 64.0 in | Wt 185.0 lb

## 2019-10-23 DIAGNOSIS — Z6831 Body mass index (BMI) 31.0-31.9, adult: Secondary | ICD-10-CM

## 2019-10-23 DIAGNOSIS — E7849 Other hyperlipidemia: Secondary | ICD-10-CM

## 2019-10-23 DIAGNOSIS — Z9189 Other specified personal risk factors, not elsewhere classified: Secondary | ICD-10-CM

## 2019-10-23 DIAGNOSIS — E559 Vitamin D deficiency, unspecified: Secondary | ICD-10-CM | POA: Diagnosis not present

## 2019-10-23 DIAGNOSIS — E669 Obesity, unspecified: Secondary | ICD-10-CM

## 2019-10-23 DIAGNOSIS — R739 Hyperglycemia, unspecified: Secondary | ICD-10-CM | POA: Diagnosis not present

## 2019-10-23 NOTE — Progress Notes (Signed)
Chief Complaint:   OBESITY Alison Davies is here to discuss her progress with her obesity treatment plan along with follow-up of her obesity related diagnoses. Alison Davies is keeping a food journal and adhering to recommended goals of 1400-1500 calories and 95 grams of protein and states she is following her eating plan approximately 60% of the time. [name] states she is walking/biking/swimming 30 minutes 2 times per week.  Today's visit was #: 6 Starting weight: 199 lbs Starting date: 07/26/2017 Today's weight: 185 lbs Today's date: 10/23/2019 Total lbs lost to date: 14 Total lbs lost since last in-office visit: 0  Interim History: Alison Davies reports that she got slightly off plan due to being bored with meal choices.  Subjective:   Vitamin D deficiency. Alison Davies is on Vitamin D weekly. No nausea, vomiting, or muscle weakness. She is due for labs.Last Vitamin D was 29.1 on 11/13/2018.  Hyperglycemia. Alison Davies has a history of some elevated blood glucose readings without a diagnosis of diabetes. She denies polyphagia. She is on no medications. Is due for labs.  Other hyperlipidemia. Carry has hyperlipidemia and has been trying to improve her cholesterol levels with intensive lifestyle modification including a low saturated fat diet, exercise and weight loss. She denies any chest pain. Alison Davies is on no medication. She is exercising regularly.  Lab Results  Component Value Date   ALT 17 06/13/2019   AST 13 06/13/2019   ALKPHOS 70 06/13/2019   BILITOT 0.3 06/13/2019   Lab Results  Component Value Date   CHOL 222 (H) 06/12/2019   HDL 46.80 06/12/2019   LDLCALC 156 (H) 06/12/2019   TRIG 99.0 06/12/2019   CHOLHDL 5 06/12/2019   At risk for diabetes mellitus. Alison Davies is at higher than average risk for developing diabetes due to her obesity.   Assessment/Plan:   Vitamin D deficiency. Low Vitamin D level contributes to fatigue and are associated with obesity, breast, and colon cancer. She agrees  to continue to take Vitamin D and VITAMIN D 25 Hydroxy (Vit-D Deficiency, Fractures) level will be checked today.  Hyperglycemia. Fasting labs will be obtained and results with be discussed with Dessire in 2 weeks at her follow up visit. In the meanwhile Alison Davies was started on a lower simple carbohydrate diet and will work on weight loss efforts. Comprehensive metabolic panel, Hemoglobin A1c, Insulin, random will be checked today.  Other hyperlipidemia. Cardiovascular risk and specific lipid/LDL goals reviewed.  We discussed several lifestyle modifications today and [name] will continue to work on diet, exercise and weight loss efforts. Orders and follow up as documented in patient record. Lipid Panel With LDL/HDL Ratio will be checked today.  Counseling Intensive lifestyle modifications are the first line treatment for this issue. . Dietary changes: Increase soluble fiber. Decrease simple carbohydrates. . Exercise changes: Moderate to vigorous-intensity aerobic activity 150 minutes per week if tolerated. . Lipid-lowering medications: see documented in medical record.   At risk for diabetes mellitus. [name] was given approximately 15 minutes of diabetes education and counseling today. We discussed intensive lifestyle modifications today with an emphasis on weight loss as well as increasing exercise and decreasing simple carbohydrates in her diet. We also reviewed medication options with an emphasis on risk versus benefit of those discussed.   Repetitive spaced learning was employed today to elicit superior memory formation and behavioral change.  Class 1 obesity with serious comorbidity and body mass index (BMI) of 31.0 to 31.9 in adult, unspecified obesity type.  Alison Davies is currently in  the action stage of change. As such, her goal is to continue with weight loss efforts. She has agreed to keeping a food journal and adhering to recommended goals of 1400-1500 calories and 95 grams of protein.   Exercise  goals: For substantial health benefits, adults should do at least 150 minutes (2 hours and 30 minutes) a week of moderate-intensity, or 75 minutes (1 hour and 15 minutes) a week of vigorous-intensity aerobic physical activity, or an equivalent combination of moderate- and vigorous-intensity aerobic activity. Aerobic activity should be performed in episodes of at least 10 minutes, and preferably, it should be spread throughout the week.  Behavioral modification strategies: increasing lean protein intake and decreasing eating out.  Alison Davies has agreed to follow-up with our clinic in 4 weeks. She was informed of the importance of frequent follow-up visits to maximize her success with intensive lifestyle modifications for her multiple health conditions.   Alison Davies was informed we would discuss her lab results at her next visit unless there is a critical issue that needs to be addressed sooner. Alison Davies agreed to keep her next visit at the agreed upon time to discuss these results.  Objective:   Blood pressure 111/69, pulse 61, temperature 98.2 F (36.8 C), temperature source Oral, height 5\' 4"  (1.626 m), weight 185 lb (83.9 kg), SpO2 98 %. Body mass index is 31.76 kg/m.  General: Cooperative, alert, well developed, in no acute distress. HEENT: Conjunctivae and lids unremarkable. Cardiovascular: Regular rhythm.  Lungs: Normal work of breathing. Neurologic: No focal deficits.   Lab Results  Component Value Date   CREATININE 0.65 06/13/2019   BUN 12 06/13/2019   NA 141 06/13/2019   K 4.5 06/13/2019   CL 108 (H) 06/13/2019   CO2 21 06/13/2019   Lab Results  Component Value Date   ALT 17 06/13/2019   AST 13 06/13/2019   ALKPHOS 70 06/13/2019   BILITOT 0.3 06/13/2019   Lab Results  Component Value Date   HGBA1C 4.8 06/13/2019   HGBA1C 5.0 11/13/2018   HGBA1C 4.9 04/19/2018   HGBA1C 4.7 (L) 12/19/2017   HGBA1C 5.1 07/26/2017   Lab Results  Component Value Date   INSULIN 15.4 06/13/2019    INSULIN 14.4 11/13/2018   INSULIN 21.8 04/19/2018   INSULIN 18.0 12/19/2017   INSULIN 14.6 07/26/2017   Lab Results  Component Value Date   TSH 1.850 07/26/2017   Lab Results  Component Value Date   CHOL 222 (H) 06/12/2019   HDL 46.80 06/12/2019   LDLCALC 156 (H) 06/12/2019   TRIG 99.0 06/12/2019   CHOLHDL 5 06/12/2019   Lab Results  Component Value Date   WBC 7.1 06/12/2019   HGB 14.0 06/12/2019   HCT 41.0 06/12/2019   MCV 89.7 06/12/2019   PLT 299.0 06/12/2019   No results found for: IRON, TIBC, FERRITIN  Attestation Statements:   Reviewed by clinician on day of visit: allergies, medications, problem list, medical history, surgical history, family history, social history, and previous encounter notes.  I04/01/2020, am acting as transcriptionist for Marianna Payment, PA-C   I have reviewed the above documentation for accuracy and completeness, and I agree with the above. [name], PA-C

## 2019-10-24 ENCOUNTER — Encounter (INDEPENDENT_AMBULATORY_CARE_PROVIDER_SITE_OTHER): Payer: Self-pay | Admitting: Physician Assistant

## 2019-10-24 ENCOUNTER — Encounter: Payer: Self-pay | Admitting: Family Medicine

## 2019-10-24 LAB — INSULIN, RANDOM: INSULIN: 11.1 u[IU]/mL (ref 2.6–24.9)

## 2019-10-24 LAB — LIPID PANEL WITH LDL/HDL RATIO
Cholesterol, Total: 204 mg/dL — ABNORMAL HIGH (ref 100–199)
HDL: 43 mg/dL (ref >39)
LDL Chol Calc (NIH): 141 mg/dL — ABNORMAL HIGH (ref 0–99)
LDL/HDL Ratio: 3.3 ratio — ABNORMAL HIGH (ref 0.0–3.2)
Triglycerides: 110 mg/dL (ref 0–149)
VLDL Cholesterol Cal: 20 mg/dL (ref 5–40)

## 2019-10-24 LAB — VITAMIN D 25 HYDROXY (VIT D DEFICIENCY, FRACTURES): Vit D, 25-Hydroxy: 30.9 ng/mL (ref 30.0–100.0)

## 2019-10-24 LAB — COMPREHENSIVE METABOLIC PANEL
ALT: 17 IU/L (ref 0–32)
AST: 16 IU/L (ref 0–40)
Albumin/Globulin Ratio: 1.8 (ref 1.2–2.2)
Albumin: 4.3 g/dL (ref 3.8–4.8)
Alkaline Phosphatase: 72 IU/L (ref 48–121)
BUN/Creatinine Ratio: 21 (ref 9–23)
BUN: 13 mg/dL (ref 6–20)
Bilirubin Total: 0.5 mg/dL (ref 0.0–1.2)
CO2: 23 mmol/L (ref 20–29)
Calcium: 9.2 mg/dL (ref 8.7–10.2)
Chloride: 104 mmol/L (ref 96–106)
Creatinine, Ser: 0.61 mg/dL (ref 0.57–1.00)
GFR calc Af Amer: 139 mL/min/{1.73_m2} (ref >59)
GFR calc non Af Amer: 120 mL/min/{1.73_m2} (ref >59)
Globulin, Total: 2.4 g/dL (ref 1.5–4.5)
Glucose: 98 mg/dL (ref 65–99)
Potassium: 4.4 mmol/L (ref 3.5–5.2)
Sodium: 137 mmol/L (ref 134–144)
Total Protein: 6.7 g/dL (ref 6.0–8.5)

## 2019-10-24 LAB — HEMOGLOBIN A1C
Est. average glucose Bld gHb Est-mCnc: 91 mg/dL
Hgb A1c MFr Bld: 4.8 % (ref 4.8–5.6)

## 2019-10-24 NOTE — Telephone Encounter (Signed)
Please advise 

## 2019-11-15 MED FILL — TOPIRAMATE 100 MG TABLET: 100 | 90 days supply | Qty: 90 | Fill #0

## 2019-11-15 MED FILL — VIT D2 1.25 MG (50,000 UNIT: 1.25 MG | 28 days supply | Qty: 4 | Fill #0

## 2019-11-15 MED FILL — SERTRALINE HCL 100 MG TABS: 100 | 90 days supply | Qty: 90 | Fill #0

## 2019-11-20 ENCOUNTER — Other Ambulatory Visit: Payer: Self-pay

## 2019-11-20 ENCOUNTER — Encounter (INDEPENDENT_AMBULATORY_CARE_PROVIDER_SITE_OTHER): Payer: Self-pay | Admitting: Physician Assistant

## 2019-11-20 ENCOUNTER — Ambulatory Visit (INDEPENDENT_AMBULATORY_CARE_PROVIDER_SITE_OTHER): Payer: No Typology Code available for payment source | Admitting: Physician Assistant

## 2019-11-20 VITALS — BP 103/72 | HR 69 | Temp 98.4°F | Ht 64.0 in | Wt 186.0 lb

## 2019-11-20 DIAGNOSIS — Z6832 Body mass index (BMI) 32.0-32.9, adult: Secondary | ICD-10-CM

## 2019-11-20 DIAGNOSIS — E559 Vitamin D deficiency, unspecified: Secondary | ICD-10-CM

## 2019-11-20 DIAGNOSIS — E7849 Other hyperlipidemia: Secondary | ICD-10-CM

## 2019-11-20 DIAGNOSIS — Z9189 Other specified personal risk factors, not elsewhere classified: Secondary | ICD-10-CM

## 2019-11-20 DIAGNOSIS — E669 Obesity, unspecified: Secondary | ICD-10-CM

## 2019-11-20 MED ORDER — VITAMIN D (ERGOCALCIFEROL) 1.25 MG (50000 UNIT) PO CAPS: 50000.0000 [IU] | ORAL_CAPSULE | ORAL | 0 refills | Status: DC

## 2019-11-20 NOTE — Progress Notes (Signed)
Chief Complaint:   Alison Davies is here to discuss her progress with her Alison treatment plan along with follow-up of her Alison related diagnoses. Alison Davies is keeping a food journal and adhering to recommended goals of 1400-1500 calories and 95 grams of protein and states she is following her eating plan approximately 80-85% of the time. Alison Davies states she is running/walking/weights 45-60 minutes sporadically.   Today's visit was #: 39 Starting weight: 199 lbs Starting date: 07/26/2017 Today's weight: 186 lbs Today's date: 11/20/2019 Total lbs lost to date: 13 Total lbs lost since last in-office visit: 0  Interim History: Alison Davies reports that she believes she overate her snacks over the last few weeks. She has been a little depressed due to a breakup. Her sleep schedule is off due to working nights.  Subjective:   Vitamin D deficiency. Alison Davies is on prescription Vitamin D supplementation. No nausea, vomiting, or muscle weakness.    Ref. Range 10/23/2019 14:17  Vitamin D, 25-Hydroxy Latest Ref Range: 30.0 - 100.0 ng/mL 30.9   Other hyperlipidemia. Alison Davies has hyperlipidemia and has been trying to improve her cholesterol levels with intensive lifestyle modification including a low saturated fat diet, exercise and weight loss. She denies any chest pain, claudication or myalgias. Alison Davies is on no medication. Last levels were not at goal. She is exercising regularly.  Lab Results  Component Value Date   ALT 17 10/23/2019   AST 16 10/23/2019   ALKPHOS 72 10/23/2019   BILITOT 0.5 10/23/2019   Lab Results  Component Value Date   CHOL 204 (H) 10/23/2019   HDL 43 10/23/2019   LDLCALC 141 (H) 10/23/2019   TRIG 110 10/23/2019   CHOLHDL 5 06/12/2019   At risk for heart disease. Alison Davies is at a higher than average risk for cardiovascular disease due to Alison.   Assessment/Plan:   Vitamin D deficiency. Low Vitamin D level contributes to fatigue and are associated with Alison,  breast, and colon cancer. She was given a refill on her Vitamin D, Ergocalciferol, (DRISDOL) 1.25 MG (50000 UNIT) CAPS capsule every week #4 with 0 refills and will follow-up for routine testing of Vitamin D, at least 2-3 times per year to avoid over-replacement.   Other hyperlipidemia. Cardiovascular risk and specific lipid/LDL goals reviewed.  We discussed several lifestyle modifications today and Alison Davies will continue to work on diet, exercise and weight loss efforts. Orders and follow up as documented in patient record.   Counseling Intensive lifestyle modifications are the first line treatment for this issue. . Dietary changes: Increase soluble fiber. Decrease simple carbohydrates. . Exercise changes: Moderate to vigorous-intensity aerobic activity 150 minutes per week if tolerated. . Lipid-lowering medications: see documented in medical record.  At risk for heart disease. Alison Davies was given approximately 15 minutes of coronary artery disease prevention counseling today. She is 34 y.o. female and has risk factors for heart disease including Alison. We discussed intensive lifestyle modifications today with an emphasis on specific weight loss instructions and strategies.   Repetitive spaced learning was employed today to elicit superior memory formation and behavioral change.  Class 1 Alison with serious comorbidity and body mass index (BMI) of 32.0 to 32.9 in adult, unspecified Alison type.  Alison Davies is currently in the action stage of change. As such, her goal is to continue with weight loss efforts. She has agreed to keeping a food journal and adhering to recommended goals of 1500 calories and 95 grams of protein.   Exercise goals: For  substantial health benefits, adults should do at least 150 minutes (2 hours and 30 minutes) a week of moderate-intensity, or 75 minutes (1 hour and 15 minutes) a week of vigorous-intensity aerobic physical activity, or an equivalent combination of moderate- and  vigorous-intensity aerobic activity. Aerobic activity should be performed in episodes of at least 10 minutes, and preferably, it should be spread throughout the week.  Behavioral modification strategies: decreasing eating out and avoiding temptations.  Alison Davies has agreed to follow-up with our clinic in 3 weeks. She was informed of the importance of frequent follow-up visits to maximize her success with intensive lifestyle modifications for her multiple health conditions.   Objective:   Blood pressure 103/72, pulse 69, temperature 98.4 F (36.9 C), temperature source Oral, height 5\' 4"  (1.626 m), weight 186 lb (84.4 kg), last menstrual period 10/15/2019, SpO2 98 %. Body mass index is 31.93 kg/m.  General: Cooperative, alert, well developed, in no acute distress. HEENT: Conjunctivae and lids unremarkable. Cardiovascular: Regular rhythm.  Lungs: Normal work of breathing. Neurologic: No focal deficits.   Lab Results  Component Value Date   CREATININE 0.61 10/23/2019   BUN 13 10/23/2019   NA 137 10/23/2019   K 4.4 10/23/2019   CL 104 10/23/2019   CO2 23 10/23/2019   Lab Results  Component Value Date   ALT 17 10/23/2019   AST 16 10/23/2019   ALKPHOS 72 10/23/2019   BILITOT 0.5 10/23/2019   Lab Results  Component Value Date   HGBA1C 4.8 10/23/2019   HGBA1C 4.8 06/13/2019   HGBA1C 5.0 11/13/2018   HGBA1C 4.9 04/19/2018   HGBA1C 4.7 (L) 12/19/2017   Lab Results  Component Value Date   INSULIN 11.1 10/23/2019   INSULIN 15.4 06/13/2019   INSULIN 14.4 11/13/2018   INSULIN 21.8 04/19/2018   INSULIN 18.0 12/19/2017   Lab Results  Component Value Date   TSH 1.850 07/26/2017   Lab Results  Component Value Date   CHOL 204 (H) 10/23/2019   HDL 43 10/23/2019   LDLCALC 141 (H) 10/23/2019   TRIG 110 10/23/2019   CHOLHDL 5 06/12/2019   Lab Results  Component Value Date   WBC 7.1 06/12/2019   HGB 14.0 06/12/2019   HCT 41.0 06/12/2019   MCV 89.7 06/12/2019   PLT 299.0  06/12/2019   No results found for: IRON, TIBC, FERRITIN  Attestation Statements:   Reviewed by clinician on day of visit: allergies, medications, problem list, medical history, surgical history, family history, social history, and previous encounter notes.  I04/01/2020, am acting as transcriptionist for Marianna Payment, PA-C   I have reviewed the above documentation for accuracy and completeness, and I agree with the above. [name], PA-C

## 2019-12-09 ENCOUNTER — Encounter: Payer: Self-pay | Admitting: Family Medicine

## 2019-12-11 ENCOUNTER — Ambulatory Visit (INDEPENDENT_AMBULATORY_CARE_PROVIDER_SITE_OTHER): Payer: No Typology Code available for payment source | Admitting: Physician Assistant

## 2019-12-19 ENCOUNTER — Ambulatory Visit (INDEPENDENT_AMBULATORY_CARE_PROVIDER_SITE_OTHER): Payer: No Typology Code available for payment source | Admitting: Physician Assistant

## 2019-12-19 ENCOUNTER — Encounter (INDEPENDENT_AMBULATORY_CARE_PROVIDER_SITE_OTHER): Payer: Self-pay | Admitting: Physician Assistant

## 2019-12-19 ENCOUNTER — Other Ambulatory Visit: Payer: Self-pay

## 2019-12-19 VITALS — BP 110/76 | HR 67 | Temp 98.2°F | Ht 64.0 in | Wt 187.0 lb

## 2019-12-19 DIAGNOSIS — E8881 Metabolic syndrome: Secondary | ICD-10-CM

## 2019-12-19 DIAGNOSIS — Z9189 Other specified personal risk factors, not elsewhere classified: Secondary | ICD-10-CM

## 2019-12-19 DIAGNOSIS — E669 Obesity, unspecified: Secondary | ICD-10-CM | POA: Diagnosis not present

## 2019-12-19 DIAGNOSIS — Z6832 Body mass index (BMI) 32.0-32.9, adult: Secondary | ICD-10-CM

## 2019-12-19 DIAGNOSIS — E559 Vitamin D deficiency, unspecified: Secondary | ICD-10-CM | POA: Diagnosis not present

## 2019-12-23 NOTE — Progress Notes (Signed)
Chief Complaint:   OBESITY Alison Davies is here to discuss her progress with her obesity treatment plan along with follow-up of her obesity related diagnoses. Alison Davies is keeping a food journal and adhering to recommended goals of 1500 calories and 95 grams of protein and states she is following her eating plan approximately 25% of the time. Alison Davies states she is walking/swimming 30 minutes 2-3 times per week.  Today's visit was #: 40 Starting weight: 199 lbs Starting date: 07/26/2017 Today's weight: 187 lbs Today's date: 12/19/2019 Total lbs lost to date: 12 Total lbs lost since last in-office visit: 0  Interim History: Alison Davies reports that she was sick and did not eat on plan during that time. She is feeling better and started back on plan yesterday.  Subjective:   Vitamin D deficiency. No nausea, vomiting, or muscle weakness on prescription Vitamin D.   Ref. Range 10/23/2019 14:17  Vitamin D, 25-Hydroxy Latest Ref Range: 30.0 - 100.0 ng/mL 30.9   Insulin resistance. Alison Davies has a diagnosis of insulin resistance based on her elevated fasting insulin level >5. She continues to work on diet and exercise to decrease her risk of diabetes. Alison Davies is on no medication and denies polyphagia.  Lab Results  Component Value Date   INSULIN 11.1 10/23/2019   INSULIN 15.4 06/13/2019   INSULIN 14.4 11/13/2018   INSULIN 21.8 04/19/2018   INSULIN 18.0 12/19/2017   Lab Results  Component Value Date   HGBA1C 4.8 10/23/2019   At risk for osteoporosis. Alison Davies is at higher risk of osteopenia and osteoporosis due to Vitamin D deficiency.   Assessment/Plan:   Vitamin D deficiency. Low Vitamin D level contributes to fatigue and are associated with obesity, breast, and colon cancer. She was given a refill on her Vitamin D, Ergocalciferol, (DRISDOL) 1.25 MG (50000 UNIT) CAPS capsule every week #4 with 0 refills and will follow-up for routine testing of Vitamin D, at least 2-3 times per year to avoid  over-replacement.   Insulin resistance. Alison Davies will continue to work on weight loss, exercise, and decreasing simple carbohydrates to help decrease the risk of diabetes. Alison Davies agreed to follow-up with [name] as directed to closely monitor her progress.  At risk for osteoporosis. Alison Davies was given approximately 15 minutes of osteoporosis prevention counseling today. Alison Davies is at risk for osteopenia and osteoporosis due to her Vitamin D deficiency. She was encouraged to take her Vitamin D and follow her higher calcium diet and increase strengthening exercise to help strengthen her bones and decrease her risk of osteopenia and osteoporosis.  Repetitive spaced learning was employed today to elicit superior memory formation and behavioral change.  Class 1 obesity with serious comorbidity and body mass index (BMI) of 32.0 to 32.9 in adult, unspecified obesity type.  Alison Davies is currently in the action stage of change. As such, her goal is to continue with weight loss efforts. She has agreed to the Category 3 Plan.   Exercise goals: For substantial health benefits, adults should do at least 150 minutes (2 hours and 30 minutes) a week of moderate-intensity, or 75 minutes (1 hour and 15 minutes) a week of vigorous-intensity aerobic physical activity, or an equivalent combination of moderate- and vigorous-intensity aerobic activity. Aerobic activity should be performed in episodes of at least 10 minutes, and preferably, it should be spread throughout the week.  Behavioral modification strategies: increasing lean protein intake and no skipping meals.  Alison Davies has agreed to follow-up with our clinic in 3 weeks. She  was informed of the importance of frequent follow-up visits to maximize her success with intensive lifestyle modifications for her multiple health conditions.   Objective:   Blood pressure 110/76, pulse 67, temperature 98.2 F (36.8 C), temperature source Oral, height 5\' 4"  (1.626 m), weight 187 lb (84.8 kg),  SpO2 96 %. Body mass index is 32.1 kg/m.  General: Cooperative, alert, well developed, in no acute distress. HEENT: Conjunctivae and lids unremarkable. Cardiovascular: Regular rhythm.  Lungs: Normal work of breathing. Neurologic: No focal deficits.   Lab Results  Component Value Date   CREATININE 0.61 10/23/2019   BUN 13 10/23/2019   NA 137 10/23/2019   K 4.4 10/23/2019   CL 104 10/23/2019   CO2 23 10/23/2019   Lab Results  Component Value Date   ALT 17 10/23/2019   AST 16 10/23/2019   ALKPHOS 72 10/23/2019   BILITOT 0.5 10/23/2019   Lab Results  Component Value Date   HGBA1C 4.8 10/23/2019   HGBA1C 4.8 06/13/2019   HGBA1C 5.0 11/13/2018   HGBA1C 4.9 04/19/2018   HGBA1C 4.7 (L) 12/19/2017   Lab Results  Component Value Date   INSULIN 11.1 10/23/2019   INSULIN 15.4 06/13/2019   INSULIN 14.4 11/13/2018   INSULIN 21.8 04/19/2018   INSULIN 18.0 12/19/2017   Lab Results  Component Value Date   TSH 1.850 07/26/2017   Lab Results  Component Value Date   CHOL 204 (H) 10/23/2019   HDL 43 10/23/2019   LDLCALC 141 (H) 10/23/2019   TRIG 110 10/23/2019   CHOLHDL 5 06/12/2019   Lab Results  Component Value Date   WBC 7.1 06/12/2019   HGB 14.0 06/12/2019   HCT 41.0 06/12/2019   MCV 89.7 06/12/2019   PLT 299.0 06/12/2019   No results found for: IRON, TIBC, FERRITIN  Attestation Statements:   Reviewed by clinician on day of visit: allergies, medications, problem list, medical history, surgical history, family history, social history, and previous encounter notes.  I04/01/2020, am acting as transcriptionist for Marianna Payment, PA-C   I have reviewed the above documentation for accuracy and completeness, and I agree with the above. [name], PA-C

## 2019-12-24 MED ORDER — VITAMIN D (ERGOCALCIFEROL) 1.25 MG (50000 UNIT) PO CAPS: 50000.0000 [IU] | ORAL_CAPSULE | ORAL | 0 refills | Status: DC

## 2019-12-24 MED FILL — VIT D2 1.25 MG (50,000 UNIT: 1.25 MG | 28 days supply | Qty: 4 | Fill #0

## 2020-01-08 ENCOUNTER — Ambulatory Visit (INDEPENDENT_AMBULATORY_CARE_PROVIDER_SITE_OTHER): Payer: No Typology Code available for payment source | Admitting: Physician Assistant

## 2020-01-08 ENCOUNTER — Encounter (INDEPENDENT_AMBULATORY_CARE_PROVIDER_SITE_OTHER): Payer: Self-pay | Admitting: Physician Assistant

## 2020-01-08 ENCOUNTER — Other Ambulatory Visit: Payer: Self-pay

## 2020-01-08 ENCOUNTER — Other Ambulatory Visit (INDEPENDENT_AMBULATORY_CARE_PROVIDER_SITE_OTHER): Payer: Self-pay | Admitting: Physician Assistant

## 2020-01-08 VITALS — BP 114/69 | HR 99 | Temp 99.1°F | Ht 64.0 in | Wt 191.0 lb

## 2020-01-08 DIAGNOSIS — Z9189 Other specified personal risk factors, not elsewhere classified: Secondary | ICD-10-CM

## 2020-01-08 DIAGNOSIS — E559 Vitamin D deficiency, unspecified: Secondary | ICD-10-CM | POA: Diagnosis not present

## 2020-01-08 DIAGNOSIS — Z6832 Body mass index (BMI) 32.0-32.9, adult: Secondary | ICD-10-CM | POA: Diagnosis not present

## 2020-01-08 DIAGNOSIS — E7849 Other hyperlipidemia: Secondary | ICD-10-CM

## 2020-01-08 DIAGNOSIS — E669 Obesity, unspecified: Secondary | ICD-10-CM | POA: Diagnosis not present

## 2020-01-08 MED ORDER — VITAMIN D (ERGOCALCIFEROL) 1.25 MG (50000 UNIT) PO CAPS: 50000.0000 [IU] | ORAL_CAPSULE | ORAL | 0 refills | Status: DC

## 2020-01-09 MED FILL — VIT D2 1.25 MG (50,000 UNIT: 1.25 MG | 28 days supply | Qty: 4 | Fill #0

## 2020-01-12 NOTE — Progress Notes (Signed)
Chief Complaint:   OBESITY Alison Davies is here to discuss her progress with her obesity treatment plan along with follow-up of her obesity related diagnoses. Alison Davies is on the Category 3 Plan and states she is following her eating plan approximately 70% of the time. Alison Davies states she is exercising for 0 minutes 0 times per week.  Today's visit was #: 41 Starting weight: 199 lbs Starting date: 07/26/2017 Today's weight: 191 lbs Today's date: 01/08/2020 Total lbs lost to date: 8 lbs Total lbs lost since last in-office visit: 0  Interim History: Alison Davies says she has not been taking her antidepressants recently.  She states that she forgot to take them on the weekends, and then she just progressively stopped.  She has been feeling anxious, not sleeping well, and has been comfort eating. She states that she has not yet spoken with her PCP about it, but that she is planning to. She is not drinking enough water.  Subjective:   1. Vitamin D deficiency Celie's Vitamin D level was 30.9 on 10/23/2019. She is currently taking prescription vitamin D 50,000 IU each week. She denies nausea, vomiting or muscle weakness.  2. Other hyperlipidemia Alison Davies has hyperlipidemia and has been trying to improve her cholesterol levels with intensive lifestyle modification including a low saturated fat diet, exercise and weight loss. She denies any chest pain, claudication or myalgias.  She is not on any medication for this and is not exercising.  Lab Results  Component Value Date   ALT 17 10/23/2019   AST 16 10/23/2019   ALKPHOS 72 10/23/2019   BILITOT 0.5 10/23/2019   Lab Results  Component Value Date   CHOL 204 (H) 10/23/2019   HDL 43 10/23/2019   LDLCALC 141 (H) 10/23/2019   TRIG 110 10/23/2019   CHOLHDL 5 06/12/2019   3. At risk for dehydration Keshanna is at risk for dehydration due to inadequate water intake.  Assessment/Plan:   1. Vitamin D deficiency Low Vitamin D level contributes to fatigue and are  associated with obesity, breast, and colon cancer. She agrees to continue to take prescription Vitamin D @50 ,000 IU every week and will follow-up for routine testing of Vitamin D, at least 2-3 times per year to avoid over-replacement.  -Refill Vitamin D, Ergocalciferol, (DRISDOL) 1.25 MG (50000 UNIT) CAPS capsule; Take 1 capsule (50,000 Units total) by mouth every 7 (seven) days.  Dispense: 4 capsule; Refill: 0  2. Other hyperlipidemia Cardiovascular risk and specific lipid/LDL goals reviewed.  We discussed several lifestyle modifications today and Jamaiya will continue to work on diet, exercise and weight loss efforts. Orders and follow up as documented in patient record.  Restart exercise.  Counseling Intensive lifestyle modifications are the first line treatment for this issue. . Dietary changes: Increase soluble fiber. Decrease simple carbohydrates. . Exercise changes: Moderate to vigorous-intensity aerobic activity 150 minutes per week if tolerated. . Lipid-lowering medications: see documented in medical record.  3. At risk for dehydration Alison Davies was given approximately 15 minutes dehydration prevention counseling today. Alison Davies is at risk for dehydration due to weight loss and current medication(s). She was encouraged to hydrate and monitor fluid status to avoid dehydration as well as weight loss plateaus.   4. Class 1 obesity with serious comorbidity and body mass index (BMI) of 32.0 to 32.9 in adult, unspecified obesity type Alison Davies is currently in the action stage of change. As such, her goal is to continue with weight loss efforts. She has agreed to the Category 3  Plan.   Exercise goals: For substantial health benefits, adults should do at least 150 minutes (2 hours and 30 minutes) a week of moderate-intensity, or 75 minutes (1 hour and 15 minutes) a week of vigorous-intensity aerobic physical activity, or an equivalent combination of moderate- and vigorous-intensity aerobic activity. Aerobic  activity should be performed in episodes of at least 10 minutes, and preferably, it should be spread throughout the week.  Behavioral modification strategies: increasing water intake and meal planning and cooking strategies.  Follow-up with PCP for depression.  Aine has agreed to follow-up with our clinic in 2 weeks. She was informed of the importance of frequent follow-up visits to maximize her success with intensive lifestyle modifications for her multiple health conditions.   Objective:   Blood pressure 114/69, pulse 99, temperature 99.1 F (37.3 C), temperature source Oral, height 5\' 4"  (1.626 m), weight 191 lb (86.6 kg), SpO2 97 %. Body mass index is 32.79 kg/m.  General: Cooperative, alert, well developed, in no acute distress. HEENT: Conjunctivae and lids unremarkable. Cardiovascular: Regular rhythm.  Lungs: Normal work of breathing. Neurologic: No focal deficits.   Lab Results  Component Value Date   CREATININE 0.61 10/23/2019   BUN 13 10/23/2019   NA 137 10/23/2019   K 4.4 10/23/2019   CL 104 10/23/2019   CO2 23 10/23/2019   Lab Results  Component Value Date   ALT 17 10/23/2019   AST 16 10/23/2019   ALKPHOS 72 10/23/2019   BILITOT 0.5 10/23/2019   Lab Results  Component Value Date   HGBA1C 4.8 10/23/2019   HGBA1C 4.8 06/13/2019   HGBA1C 5.0 11/13/2018   HGBA1C 4.9 04/19/2018   HGBA1C 4.7 (L) 12/19/2017   Lab Results  Component Value Date   INSULIN 11.1 10/23/2019   INSULIN 15.4 06/13/2019   INSULIN 14.4 11/13/2018   INSULIN 21.8 04/19/2018   INSULIN 18.0 12/19/2017   Lab Results  Component Value Date   TSH 1.850 07/26/2017   Lab Results  Component Value Date   CHOL 204 (H) 10/23/2019   HDL 43 10/23/2019   LDLCALC 141 (H) 10/23/2019   TRIG 110 10/23/2019   CHOLHDL 5 06/12/2019   Lab Results  Component Value Date   WBC 7.1 06/12/2019   HGB 14.0 06/12/2019   HCT 41.0 06/12/2019   MCV 89.7 06/12/2019   PLT 299.0 06/12/2019   Attestation  Statements:   Reviewed by clinician on day of visit: allergies, medications, problem list, medical history, surgical history, family history, social history, and previous encounter notes.  I, 08/10/2019, CMA, am acting as transcriptionist for Insurance claims handler, PA-C  I have reviewed the above documentation for accuracy and completeness, and I agree with the above. [name], PA-C

## 2020-01-14 ENCOUNTER — Ambulatory Visit: Payer: No Typology Code available for payment source | Admitting: Family Medicine

## 2020-01-16 ENCOUNTER — Other Ambulatory Visit: Payer: Self-pay

## 2020-01-16 ENCOUNTER — Encounter: Payer: Self-pay | Admitting: Family Medicine

## 2020-01-16 ENCOUNTER — Ambulatory Visit (INDEPENDENT_AMBULATORY_CARE_PROVIDER_SITE_OTHER): Payer: No Typology Code available for payment source | Admitting: Family Medicine

## 2020-01-16 VITALS — BP 130/90 | HR 82 | Temp 97.7°F | Ht 64.0 in | Wt 188.6 lb

## 2020-01-16 DIAGNOSIS — F339 Major depressive disorder, recurrent, unspecified: Secondary | ICD-10-CM | POA: Diagnosis not present

## 2020-01-16 DIAGNOSIS — Z23 Encounter for immunization: Secondary | ICD-10-CM

## 2020-01-16 DIAGNOSIS — F411 Generalized anxiety disorder: Secondary | ICD-10-CM | POA: Diagnosis not present

## 2020-01-16 NOTE — Progress Notes (Signed)
Alison Davies is a 34 y.o. female  Chief Complaint  Patient presents with  . Follow-up    meds and increased anxiety    HPI: Alison Davies is a 33 y.o. female who complains of increased anxiety, depression symptoms. Pt is prescribed zoloft 100mg  daily as well as xanax 0.25mg  PRN, but pt stopped taking these completely about 1.5 mo ago. Prior to that, she states she was not taking them consistently for months. She restarted 6 days ago.  Pt identifies stressors as work (works night weekends as ) and covid, some family "stuff". Pt states the past 2 weeks have been bad and she is having trouble keeping her apt clean, etc. She does not see a Engineer, structural. She is worried.   GAD 7 : Generalized Anxiety Score 06/12/2019 08/03/2018 01/10/2018 12/20/2017  Nervous, Anxious, on Edge 0 3 0 1  Control/stop worrying 0 3 0 0  Worry too much - different things 2 3 0 1  Trouble relaxing 0 3 0 0  Restless 0 3 0 1  Easily annoyed or irritable 1 3 0 1  Afraid - awful might happen 0 3 0 0  Total GAD 7 Score 3 21 0 4  Anxiety Difficulty Somewhat difficult - Not difficult at all Somewhat difficult  Some encounter information is confidential and restricted. Go to Review Flowsheets activity to see all data.    Depression screen [facility] 2/9 06/12/2019 08/03/2018 01/10/2018  Decreased Interest 2 3 0  Down, Depressed, Hopeless 2 3 0  PHQ - 2 Score 4 6 0  Altered sleeping 1 3 1   Tired, decreased energy 1 3 1   Change in appetite 0 3 0  Feeling bad or failure about yourself  0 0 0  Trouble concentrating 0 0 0  Moving slowly or fidgety/restless 0 2 0  Suicidal thoughts 0 0 0  PHQ-9 Score 6 17 2   Difficult doing work/chores Very difficult - -  Some encounter information is confidential and restricted. Go to Review Flowsheets activity to see all data.  Some recent data might be hidden     Past Medical History:  Diagnosis Date  . Anxiety   . Hyperlipidemia   . Leg edema   . Migraines   . Vaginal  Pap smear, abnormal 06/12/2019   LGSIL +HPV    No past surgical history on file.  Social History   Socioeconomic History  . Marital status: Single    Spouse name: Not on file  . Number of children: Not on file  . Years of education: Not on file  . Highest education level: Not on file  Occupational History  . Occupation: Rad : [employer]  Tobacco Use  . Smoking status: Never Smoker  . Smokeless tobacco: Never Used  Vaping Use  . Vaping Use: Never used  Substance and Sexual Activity  . Alcohol use: Yes    Comment: occasional  . Drug use: Never  . Sexual activity: Yes    Birth control/protection: Pill  Other Topics Concern  . Not on file  Social History Narrative  . Not on file   Social Determinants of Health   Financial Resource Strain:   . Difficulty of Paying Living Expenses: Not on file  Food Insecurity:   . Worried About in the Last Year: Not on file  . Ran Out of Food in the Last Year: Not on file  Transportation Needs:   . Lack of  Transportation (Medical): Not on file  . Lack of Transportation (Non-Medical): Not on file  Physical Activity:   . Days of Exercise per Week: Not on file  . Minutes of Exercise per Session: Not on file  Stress:   . Feeling of Stress : Not on file  Social Connections:   . Frequency of Communication with Friends and Family: Not on file  . Frequency of Social Gatherings with Friends and Family: Not on file  . Attends Religious Services: Not on file  . Active Member of Clubs or Organizations: Not on file  . Attends Banker Meetings: Not on file  . Marital Status: Not on file  Intimate Partner Violence:   . Fear of Current or Ex-Partner: Not on file  . Emotionally Abused: Not on file  . Physically Abused: Not on file  . Sexually Abused: Not on file    Family History  Problem Relation Age of Onset  . Anxiety disorder Mother   . Diabetes Father   . Obesity Father       Immunization History  Administered Date(s) Administered  . Influenza-Unspecified 01/17/2014  . PFIZER SARS-COV-2 Vaccination 05/30/2019, 06/18/2019  . PPD Test 02/24/2014, 03/06/2014, 03/27/2015, 03/28/2016  . Tdap 02/17/2014, 03/04/2018    Outpatient Encounter Medications as of 01/16/2020  Medication Sig  . ALPRAZolam (XANAX) 0.25 MG tablet Take 1 tablet (0.25 mg total) by mouth at bedtime as needed for anxiety.  . Biotin 3 MG TABS Take 2 tablets by mouth daily.  . Cyanocobalamin (VITAMIN B-12 CR) 1500 MCG TBCR Take 2 tablets by mouth 2 (two) times daily.  . Melatonin 5 MG CHEW Chew 2 capsules by mouth at bedtime as needed.  . Multiple Vitamin (MULTIVITAMIN WITH MINERALS) TABS tablet Take 1 tablet by mouth daily.  . sertraline (ZOLOFT) 100 MG tablet Take 1 tablet (100 mg total) by mouth daily.  Marland Kitchen topiramate (TOPAMAX) 100 MG tablet Take 1 tablet (100 mg total) by mouth at bedtime.  . Turmeric (QC TUMERIC COMPLEX PO) Take by mouth.  . vitamin C (ASCORBIC ACID) 250 MG tablet Take 250 mg by mouth daily.  . Vitamin D, Ergocalciferol, (DRISDOL) 1.25 MG (50000 UNIT) CAPS capsule Take 1 capsule (50,000 Units total) by mouth every 7 (seven) days.  . norethindrone (MICRONOR) 0.35 MG tablet TAKE 1 TABLET (0.35 MG TOTAL) BY MOUTH DAILY. (Patient not taking: Reported on 01/16/2020)  . SUMAtriptan (IMITREX) 25 MG tablet Take 1 tablet (25 mg total) by mouth every 2 (two) hours as needed for migraine. May repeat in 2 hours if headache persists or recurs. (Patient not taking: Reported on 01/16/2020)   No facility-administered encounter medications on file as of 01/16/2020.     ROS: Pertinent positives and negatives noted in HPI. Remainder of ROS non-contributory    No Known Allergies  BP 130/90   Pulse 82   Temp 97.7 F (36.5 C) (Temporal)   Ht 5\' 4"  (1.626 m)   Wt 188 lb 9.6 oz (85.5 kg)   SpO2 99%   BMI 32.37 kg/m   Physical Exam Constitutional:      General: She is not in acute  distress.    Appearance: Normal appearance. She is not ill-appearing.  Pulmonary:     Effort: No respiratory distress.  Neurological:     General: No focal deficit present.     Mental Status: She is alert and oriented to person, place, and time.  Psychiatric:        Mood and Affect: Mood  normal.        Behavior: Behavior normal.      A/P:  1. GAD (generalized anxiety disorder) 2. Depression, recurrent (HCC) - PHQ-9 = 20, GAD-7 = 17 - not controlled as pt was off meds x 1.61mo and prior to that not taking it consistently - restart zoloft 100mg  daily - does not need  - recommended BH counseling - info included in AVS   This visit occurred during the SARS-CoV-2 public health emergency.  Safety protocols were in place, including screening questions prior to the visit, additional usage of staff PPE, and extensive cleaning of exam room while observing appropriate contact time as indicated for disinfecting solutions.

## 2020-01-16 NOTE — Addendum Note (Signed)
Addended by: Waymond Cera on: 01/16/2020 09:48 AM   Modules accepted: Orders

## 2020-01-16 NOTE — Patient Instructions (Signed)
[facility]: [email or web address]  [facility] [email or web address]  [email or web address]  [facility] [email or web address]  [email or web address]  

## 2020-01-22 ENCOUNTER — Ambulatory Visit (INDEPENDENT_AMBULATORY_CARE_PROVIDER_SITE_OTHER): Payer: No Typology Code available for payment source | Admitting: Physician Assistant

## 2020-01-23 ENCOUNTER — Encounter (INDEPENDENT_AMBULATORY_CARE_PROVIDER_SITE_OTHER): Payer: Self-pay | Admitting: Physician Assistant

## 2020-01-23 ENCOUNTER — Other Ambulatory Visit: Payer: Self-pay

## 2020-01-23 ENCOUNTER — Ambulatory Visit (INDEPENDENT_AMBULATORY_CARE_PROVIDER_SITE_OTHER): Payer: No Typology Code available for payment source | Admitting: Physician Assistant

## 2020-01-23 VITALS — BP 113/75 | HR 82 | Temp 97.9°F | Ht 64.0 in | Wt 184.0 lb

## 2020-01-23 DIAGNOSIS — E669 Obesity, unspecified: Secondary | ICD-10-CM | POA: Diagnosis not present

## 2020-01-23 DIAGNOSIS — Z6831 Body mass index (BMI) 31.0-31.9, adult: Secondary | ICD-10-CM

## 2020-01-23 DIAGNOSIS — E559 Vitamin D deficiency, unspecified: Secondary | ICD-10-CM | POA: Diagnosis not present

## 2020-01-23 DIAGNOSIS — E8881 Metabolic syndrome: Secondary | ICD-10-CM | POA: Diagnosis not present

## 2020-01-27 NOTE — Progress Notes (Signed)
Chief Complaint:   OBESITY Alison Davies is here to discuss her progress with her obesity treatment plan along with follow-up of her obesity related diagnoses. Alison Davies is on the Category 3 Plan and states she is following her eating plan approximately 30% of the time. Alison Davies states she is exercising for 0 minutes 0 times per week.  Today's visit was #: 42 Starting weight: 199 lbs Starting date: 07/26/2017 Today's weight: 184 lbs Today's date: 01/23/2020 Total lbs lost to date: 15 lbs Total lbs lost since last in-office visit: 7 lbs  Interim History: Alison Davies says that after her last visit, she followed up with her PCP regarding her depression medications.  She is back to taking her Zoloft as prescribed.  She is on vacation for the next 2 weeks.  Subjective:   1. Insulin resistance Alison Davies has a diagnosis of insulin resistance based on her elevated fasting insulin level >5. She continues to work on diet and exercise to decrease her risk of diabetes.  No medications.  She feels that her hunger is better controlled the last 2 weeks.  Lab Results  Component Value Date   INSULIN 11.1 10/23/2019   INSULIN 15.4 06/13/2019   INSULIN 14.4 11/13/2018   INSULIN 21.8 04/19/2018   INSULIN 18.0 12/19/2017   Lab Results  Component Value Date   HGBA1C 4.8 10/23/2019   2. Vitamin D deficiency Alison Davies's Vitamin D level was 30.9 on 10/23/2019. She is currently taking prescription vitamin D 50,000 IU each week. She denies nausea, vomiting or muscle weakness.  She has not been walking as much over the last 2 weeks.  Assessment/Plan:   1. Insulin resistance Alison Davies will continue to work on weight loss, exercise, and decreasing simple carbohydrates to help decrease the risk of diabetes. Alison Davies agreed to follow-up with [name] as directed to closely monitor her progress.  Continue with plan.  2. Vitamin D deficiency Low Vitamin D level contributes to fatigue and are associated with obesity, breast, and colon cancer. She  agrees to continue to take prescription Vitamin D @50 ,000 IU every week and will follow-up for routine testing of Vitamin D, at least 2-3 times per year to avoid over-replacement.  Continue with vitamin D.  3. Class 1 obesity with serious comorbidity and body mass index (BMI) of 31.0 to 31.9 in adult, unspecified obesity type Alison Davies is currently in the action stage of change. As such, her goal is to continue with weight loss efforts. She has agreed to the Category 3 Plan.   Exercise goals: For substantial health benefits, adults should do at least 150 minutes (2 hours and 30 minutes) a week of moderate-intensity, or 75 minutes (1 hour and 15 minutes) a week of vigorous-intensity aerobic physical activity, or an equivalent combination of moderate- and vigorous-intensity aerobic activity. Aerobic activity should be performed in episodes of at least 10 minutes, and preferably, it should be spread throughout the week.  Behavioral modification strategies: meal planning and cooking strategies and keeping healthy foods in the home.  Alison Davies has agreed to follow-up with our clinic in 3 weeks. She was informed of the importance of frequent follow-up visits to maximize her success with intensive lifestyle modifications for her multiple health conditions.   Objective:   Blood pressure 113/75, pulse 82, temperature 97.9 F (36.6 C), height 5\' 4"  (1.626 m), weight 184 lb (83.5 kg), SpO2 98 %. Body mass index is 31.58 kg/m.  General: Cooperative, alert, well developed, in no acute distress. HEENT: Conjunctivae and lids unremarkable.  Cardiovascular: Regular rhythm.  Lungs: Normal work of breathing. Neurologic: No focal deficits.   Lab Results  Component Value Date   CREATININE 0.61 10/23/2019   BUN 13 10/23/2019   NA 137 10/23/2019   K 4.4 10/23/2019   CL 104 10/23/2019   CO2 23 10/23/2019   Lab Results  Component Value Date   ALT 17 10/23/2019   AST 16 10/23/2019   ALKPHOS 72 10/23/2019    BILITOT 0.5 10/23/2019   Lab Results  Component Value Date   HGBA1C 4.8 10/23/2019   HGBA1C 4.8 06/13/2019   HGBA1C 5.0 11/13/2018   HGBA1C 4.9 04/19/2018   HGBA1C 4.7 (L) 12/19/2017   Lab Results  Component Value Date   INSULIN 11.1 10/23/2019   INSULIN 15.4 06/13/2019   INSULIN 14.4 11/13/2018   INSULIN 21.8 04/19/2018   INSULIN 18.0 12/19/2017   Lab Results  Component Value Date   TSH 1.850 07/26/2017   Lab Results  Component Value Date   CHOL 204 (H) 10/23/2019   HDL 43 10/23/2019   LDLCALC 141 (H) 10/23/2019   TRIG 110 10/23/2019   CHOLHDL 5 06/12/2019   Lab Results  Component Value Date   WBC 7.1 06/12/2019   HGB 14.0 06/12/2019   HCT 41.0 06/12/2019   MCV 89.7 06/12/2019   PLT 299.0 06/12/2019   Attestation Statements:   Reviewed by clinician on day of visit: allergies, medications, problem list, medical history, surgical history, family history, social history, and previous encounter notes.  Time spent on visit including pre-visit chart review and post-visit care and charting was 30 minutes.   I, Insurance claims handler, CMA, am acting as transcriptionist for [name], PA-C  I have reviewed the above documentation for accuracy and completeness, and I agree with the above. [name], PA-C

## 2020-02-06 ENCOUNTER — Other Ambulatory Visit (INDEPENDENT_AMBULATORY_CARE_PROVIDER_SITE_OTHER): Payer: Self-pay | Admitting: Obstetrics & Gynecology

## 2020-02-06 DIAGNOSIS — Z30011 Encounter for initial prescription of contraceptive pills: Secondary | ICD-10-CM

## 2020-02-06 NOTE — Telephone Encounter (Signed)
This medication is outside of the [facility]'s protocols. Please sign and close the encounter if you approve:last here 10/2018    Dr Ileana Ladd no longer in system    If this medication is denied please have your staff inform the patient and schedule an appointment if necessary.

## 2020-02-10 MED ORDER — DROSPIRENONE-ETHINYL ESTRADIOL 3-0.02 MG OR TABS
ORAL_TABLET | ORAL | 0 refills | Status: AC
Start: 2020-02-10 — End: ?

## 2020-02-10 NOTE — Telephone Encounter (Signed)
Might be overdue for cervical cancer screening - our scanned records link to the wrong date, it showed abnormal pap from 2018 saying it was done 11/20/18    She should scheduled follow-up with Dr. Myrtie Hawk or Sad[facility] Haber - please let her know

## 2020-02-10 NOTE — Telephone Encounter (Signed)
Still open on our end. Please respond.

## 2020-02-11 NOTE — Telephone Encounter (Signed)
Called pt. No answer. VM left for pt to return RN's phone call @ [phone].   MyChart sent as well

## 2020-02-17 ENCOUNTER — Ambulatory Visit (INDEPENDENT_AMBULATORY_CARE_PROVIDER_SITE_OTHER): Payer: No Typology Code available for payment source | Admitting: Physician Assistant

## 2020-02-17 ENCOUNTER — Other Ambulatory Visit: Payer: Self-pay

## 2020-02-17 ENCOUNTER — Encounter (INDEPENDENT_AMBULATORY_CARE_PROVIDER_SITE_OTHER): Payer: Self-pay | Admitting: Physician Assistant

## 2020-02-17 VITALS — BP 121/78 | HR 60 | Temp 97.9°F | Ht 64.0 in | Wt 187.0 lb

## 2020-02-17 DIAGNOSIS — E559 Vitamin D deficiency, unspecified: Secondary | ICD-10-CM

## 2020-02-17 DIAGNOSIS — Z9189 Other specified personal risk factors, not elsewhere classified: Secondary | ICD-10-CM | POA: Diagnosis not present

## 2020-02-17 DIAGNOSIS — Z6832 Body mass index (BMI) 32.0-32.9, adult: Secondary | ICD-10-CM

## 2020-02-17 DIAGNOSIS — F3289 Other specified depressive episodes: Secondary | ICD-10-CM | POA: Diagnosis not present

## 2020-02-17 DIAGNOSIS — E669 Obesity, unspecified: Secondary | ICD-10-CM | POA: Diagnosis not present

## 2020-02-18 ENCOUNTER — Ambulatory Visit: Payer: No Typology Code available for payment source | Attending: Internal Medicine

## 2020-02-18 DIAGNOSIS — Z23 Encounter for immunization: Secondary | ICD-10-CM

## 2020-02-18 NOTE — Progress Notes (Signed)
Chief Complaint:   OBESITY Alison Davies is here to discuss her progress with her obesity treatment plan along with follow-up of her obesity related diagnoses. Alison Davies is on the Category 3 Plan and states she is following her eating plan approximately 75% of the time. Alison Davies states she is exercising 0 minutes 0 times per week.  Today's visit was #: 43 Starting weight: 199 lbs Starting date: 07/26/2017 Today's weight: 187 lbs Today's date: 02/17/2020 Total lbs lost to date: 12 Total lbs lost since last in-office visit: 0  Interim History: Alison Davies states that she did not do well this weekend with her food choices as she had a rough time at work. She continues to have a difficult time with cravings.  Subjective:   Vitamin D deficiency. Alison Davies is on Vitamin D weekly, which she is tolerating well.   Ref. Range 10/23/2019 14:17  Vitamin D, 25-Hydroxy Latest Ref Range: 30.0 - 100.0 ng/mL 30.9   Other depression, with emotional eating. Alison Davies is struggling with emotional eating and using food for comfort to the extent that it is negatively impacting her health. She has been working on behavior modification techniques to help reduce her emotional eating and has been somewhat successful. She shows no sign of suicidal or homicidal ideations. Alison Davies has generalized anxiety disorder, so bupropion is not appropriate. She restarted Topamax 3 weeks ago, which helps with cravings.  At increased risk of exposure to COVID-19 virus. The patient is at higher risk of COVID-19 infection due to higher infection rates locally.  Assessment/Plan:   Vitamin D deficiency. Low Vitamin D level contributes to fatigue and are associated with obesity, breast, and colon cancer. She agrees to continue to take Vitamin D as directed and will follow-up for routine testing of Vitamin D, at least 2-3 times per year to avoid over-replacement.  Other depression, with emotional eating. Behavior modification techniques were  discussed today to help Alison Davies deal with her emotional/non-hunger eating behaviors.  Orders and follow up as documented in patient record. Alison Davies will be referred back to Dr. Dewaine Conger.  At increased risk of exposure to COVID-19 virus. Alison Davies was given approximately 15 minutes of COVID prevention counseling today Counseling  COVID-19 is a respiratory infection that is caused by a virus. It can cause serious infections, such as pneumonia, acute respiratory distress syndrome, acute respiratory failure, or sepsis.  You are more likely to develop a serious illness if you are 33 years of age or older, have a weak immune system, live in a nursing home, have chronic disease, or have obesity.  Get vaccinated as soon as they are available to you.  For our most current information, please visit [email or web address]  Wash your hands often with soap and water for 20 seconds. If soap and water are not available, use alcohol-based hand sanitizer.  Wear a face mask. Make sure your mask covers your nose and mouth.  Maintain at least 6 feet distance from others when in public.  Get help right away if  You have trouble breathing, chest pain, confusion, or other concerning symptoms.  Repetitive spaced learning was employed today to elicit superior memory formation and behavioral change.  Class 1 obesity with serious comorbidity and body mass index (BMI) of 32.0 to 32.9 in adult, unspecified obesity type. [name] will start Semaglutide-Weight Management (WEGOVY) 0.25 MG/0.5ML SOAJ #4 with 0 refills.  Alison Davies is currently in the action stage of change. As such, her goal is to continue with weight loss efforts. She has  agreed to the Category 3 Plan.   Exercise goals: For substantial health benefits, adults should do at least 150 minutes (2 hours and 30 minutes) a week of moderate-intensity, or 75 minutes (1 hour and 15 minutes) a week of vigorous-intensity aerobic physical activity, or an equivalent combination  of moderate- and vigorous-intensity aerobic activity. Aerobic activity should be performed in episodes of at least 10 minutes, and preferably, it should be spread throughout the week.  Behavioral modification strategies: decreasing eating out and meal planning and cooking strategies.  Alison Davies has agreed to follow-up with our clinic in 2 weeks. She was informed of the importance of frequent follow-up visits to maximize her success with intensive lifestyle modifications for her multiple health conditions.   Objective:   Blood pressure 121/78, pulse 60, temperature 97.9 F (36.6 C), temperature source Oral, height 5\' 4"  (1.626 m), weight 187 lb (84.8 kg), SpO2 97 %. Body mass index is 32.1 kg/m.  General: Cooperative, alert, well developed, in no acute distress. HEENT: Conjunctivae and lids unremarkable. Cardiovascular: Regular rhythm.  Lungs: Normal work of breathing. Neurologic: No focal deficits.   Lab Results  Component Value Date   CREATININE 0.61 10/23/2019   BUN 13 10/23/2019   NA 137 10/23/2019   K 4.4 10/23/2019   CL 104 10/23/2019   CO2 23 10/23/2019   Lab Results  Component Value Date   ALT 17 10/23/2019   AST 16 10/23/2019   ALKPHOS 72 10/23/2019   BILITOT 0.5 10/23/2019   Lab Results  Component Value Date   HGBA1C 4.8 10/23/2019   HGBA1C 4.8 06/13/2019   HGBA1C 5.0 11/13/2018   HGBA1C 4.9 04/19/2018   HGBA1C 4.7 (L) 12/19/2017   Lab Results  Component Value Date   INSULIN 11.1 10/23/2019   INSULIN 15.4 06/13/2019   INSULIN 14.4 11/13/2018   INSULIN 21.8 04/19/2018   INSULIN 18.0 12/19/2017   Lab Results  Component Value Date   TSH 1.850 07/26/2017   Lab Results  Component Value Date   CHOL 204 (H) 10/23/2019   HDL 43 10/23/2019   LDLCALC 141 (H) 10/23/2019   TRIG 110 10/23/2019   CHOLHDL 5 06/12/2019   Lab Results  Component Value Date   WBC 7.1 06/12/2019   HGB 14.0 06/12/2019   HCT 41.0 06/12/2019   MCV 89.7 06/12/2019   PLT 299.0  06/12/2019   No results found for: IRON, TIBC, FERRITIN  Attestation Statements:   Reviewed by clinician on day of visit: allergies, medications, problem list, medical history, surgical history, family history, social history, and previous encounter notes.  I04/01/2020, am acting as transcriptionist for Marianna Payment, PA-C   I have reviewed the above documentation for accuracy and completeness, and I agree with the above. [name]

## 2020-02-18 NOTE — Progress Notes (Signed)
   Covid-19 Vaccination Clinic  Name:  Alison Davies    MRN: [id number] DOB: 10-16-1985  02/18/2020  Alison Davies was observed post Covid-19 immunization for 15 minutes without incident. She was provided with Vaccine Information Sheet and instruction to access the V-Safe system.   Alison Davies was instructed to call 911 with any severe reactions post vaccine: Marland Kitchen Difficulty breathing  . Swelling of face and throat  . A fast heartbeat  . A bad rash all over body  . Dizziness and weakness

## 2020-02-19 ENCOUNTER — Other Ambulatory Visit (INDEPENDENT_AMBULATORY_CARE_PROVIDER_SITE_OTHER): Payer: Self-pay | Admitting: Physician Assistant

## 2020-02-19 MED ORDER — WEGOVY 0.25 MG/0.5ML ~~LOC~~ SOAJ: 0.2500 mg | SUBCUTANEOUS | 0 refills | Status: DC

## 2020-02-20 ENCOUNTER — Encounter (INDEPENDENT_AMBULATORY_CARE_PROVIDER_SITE_OTHER): Payer: Self-pay | Admitting: Physician Assistant

## 2020-02-20 ENCOUNTER — Encounter (INDEPENDENT_AMBULATORY_CARE_PROVIDER_SITE_OTHER): Payer: Self-pay

## 2020-02-20 ENCOUNTER — Encounter: Payer: Self-pay | Admitting: Family Medicine

## 2020-02-24 ENCOUNTER — Ambulatory Visit (INDEPENDENT_AMBULATORY_CARE_PROVIDER_SITE_OTHER): Payer: No Typology Code available for payment source | Admitting: Family Medicine

## 2020-02-24 VITALS — BP 137/90 | HR 98 | Temp 96.7°F | Resp 16

## 2020-02-24 DIAGNOSIS — D2271 Melanocytic nevi of right lower limb, including hip: Secondary | ICD-10-CM

## 2020-02-24 DIAGNOSIS — M7661 Achilles tendinitis, right leg: Secondary | ICD-10-CM

## 2020-02-24 DIAGNOSIS — D2261 Melanocytic nevi of right upper limb, including shoulder: Secondary | ICD-10-CM

## 2020-02-24 NOTE — Progress Notes (Signed)
The following encounter was generated from the [facility].    New patient.      ID/CC: 34 yo woman with right heel pain.     HPI: she has had right heel pain for about a week. Started while trekking in [place]: 8 miles per day for 8 days.  Came home yesterday, but pain worse, even after resting on the plane.  Feels crackling sensation: posterior heel. Able to walk, but it hurts.  Left heel is fine.     She did not have any injuries.      Suspected boots were irritating the area, but no improvement with changed footwear.    No foot pain.          Past Medical History:   Diagnosis Date   . Pap smear abnormality of cervix 2018       There is no problem list on file for this patient.        Soc Hx: recent [place] trip   Outpatient Medications Prior to Visit   Medication Sig Dispense Refill   . amphetamine-dextroAMPHetamine ER 10 MG 24 hr capsule TAKE 1 CAPSULE BY MOUTH EVERY MORNING FOR ADHD     . drospirenone-ethinyl estradiol 3-0.02 MG tablet TAKE 1 TABLET BY MOUTH EVERY DAY SKIPPING PLACEBO 112 tablet 0   . triamcinolone 0.1 % ointment Apply peasized amount to opening of vagina every night until next appointment 15 g 3     No facility-administered medications prior to visit.      Review of patient's allergies indicates:  No Known Allergies      Exam:  BP (!) 137/90   Pulse 98   Temp 35.9 C (Temporal)   Resp 16   SpO2 98%   General appearance: she is in NAD  RLE: the Achilles tendon is tender just proximal to the insertion.  There is some swelling and crepitus in the area with range of motion.  Ankle itself is non-tender with full range of motion, as is the foot.  No calf or shin tenderness.  There is a large pigmented nevus on the bottom of her foot about 1.2 cm in diameter.   LLE: normal ankle and foot exam    Skin: large nevus, right shoulder.    See photos in media section    Labs/studies:  None     Assessment:    1.  Right Achilles tendonitis  2.  Large nevus, right foot.   3.  Very large  nevus, right shoulder    Should have derm eval for these.    Plan:    1.  Discussed findings and impressions with patient.  2.  Went over activity level for the tendonitis  3.  Ibuprofen or Voltaren gel   4.  Referred to podiatry at [facility] in follow up  5.  Gave referral to central derm for eval of the nevi.      Buren Kos, M.D.  Clinical assistant professor  Department of [place] [facility]

## 2020-02-24 NOTE — Patient Instructions (Signed)
YOu have Achilles tendonitis.    Use tuli heel cups     An over the counter NSAID such as ibuprofen, or topical one like Voltaren gel.    Referred to podiatry at [facility] to follow up.    Referral to derm as well.    Buren Kos, M.D.  Clinical Assistant Professor  Department of [place] [facility]

## 2020-02-25 ENCOUNTER — Encounter (INDEPENDENT_AMBULATORY_CARE_PROVIDER_SITE_OTHER): Payer: Self-pay

## 2020-02-27 MED FILL — WEGOVY 0.25 MG/0.5ML SOAJ: 0.25 | 28 days supply | Qty: 2 | Fill #0

## 2020-03-02 MED FILL — SERTRALINE HCL 100 MG TABS: 100 | 90 days supply | Qty: 90 | Fill #1

## 2020-03-02 MED FILL — TOPIRAMATE 100 MG TABLET: 100 | 90 days supply | Qty: 90 | Fill #1

## 2020-03-02 MED FILL — VIT D2 1.25 MG (50,000 UNIT: 1.25 MG | 28 days supply | Qty: 4 | Fill #0

## 2020-03-02 MED FILL — NORETHINDRONE 0.35 MG TAB: 0.35 | 84 days supply | Qty: 84 | Fill #0

## 2020-03-03 ENCOUNTER — Ambulatory Visit (INDEPENDENT_AMBULATORY_CARE_PROVIDER_SITE_OTHER): Payer: No Typology Code available for payment source | Admitting: Sports Medicine

## 2020-03-03 ENCOUNTER — Ambulatory Visit (INDEPENDENT_AMBULATORY_CARE_PROVIDER_SITE_OTHER): Payer: No Typology Code available for payment source | Admitting: Physician Assistant

## 2020-03-03 ENCOUNTER — Encounter (INDEPENDENT_AMBULATORY_CARE_PROVIDER_SITE_OTHER): Payer: Self-pay | Admitting: Physician Assistant

## 2020-03-03 ENCOUNTER — Other Ambulatory Visit: Payer: Self-pay

## 2020-03-03 VITALS — BP 128/88 | HR 94 | Temp 98.3°F | Ht 64.0 in | Wt 192.0 lb

## 2020-03-03 VITALS — BP 104/71 | HR 83 | Temp 98.9°F | Ht 64.0 in | Wt 182.0 lb

## 2020-03-03 DIAGNOSIS — E8881 Metabolic syndrome: Secondary | ICD-10-CM | POA: Diagnosis not present

## 2020-03-03 DIAGNOSIS — E7849 Other hyperlipidemia: Secondary | ICD-10-CM

## 2020-03-03 DIAGNOSIS — Z6831 Body mass index (BMI) 31.0-31.9, adult: Secondary | ICD-10-CM | POA: Diagnosis not present

## 2020-03-03 DIAGNOSIS — E669 Obesity, unspecified: Secondary | ICD-10-CM | POA: Diagnosis not present

## 2020-03-03 DIAGNOSIS — M7661 Achilles tendinitis, right leg: Secondary | ICD-10-CM

## 2020-03-03 DIAGNOSIS — Z23 Encounter for immunization: Secondary | ICD-10-CM

## 2020-03-03 MED ORDER — NAPROXEN 500 MG OR TABS
500.0000 mg | ORAL_TABLET | Freq: Two times a day (BID) | ORAL | 0 refills | Status: AC | PRN
Start: 2020-03-03 — End: ?

## 2020-03-03 MED ORDER — INFLUENZA VAC SUBUNIT QUAD 0.5 ML IM SUSY
0.5000 mL | PREFILLED_SYRINGE | Freq: Once | INTRAMUSCULAR | Status: AC
Start: 2020-03-03 — End: 2020-03-03
  Administered 2020-03-03: 0.5 mL via INTRAMUSCULAR

## 2020-03-03 NOTE — Progress Notes (Signed)
Per orders of Dr. Reche Dixon, injection of Flu Vaccine  given by Salley Scarlet. A&O x3, patient tolerated well.              Vaccine Screening Questions    Interpreter: No    1. Are you allergic to Latex? NO    2.  Have you had a serious reaction or an allergic reaction to a vaccine?  NO    3.  Currently have a moderate or severe illness, including fever?  NO    4.  Ever had a seizure or any neurological problem associated with a vaccine? (DTaP/TDaP/DTP pertinent) NO    5.  Is patient receiving any live vaccinations today? (Varicella-Chickenpox, MMR-Measles/Mumps/Rubella, Zoster-Shingles, Flumist, Yellow Fever) NOTE: oral rotavirus is exempt  NO    If YES to any of the questions above - Do NOT give vaccine.  Consult with RN or provider in clinic.  (#5 can be YES if all Live vaccine questions are answered NO)    If NO to all questions above - Patient may receive vaccine.    6.  Do you need to receive the Flu vaccine today? YES - Additional Flu Questions  Flu Vaccine Screening Questions:    Ever had a serious allergic reaction to eggs?  NO    Ever had Guillain-Barre syndrome associated with a vaccine? NO    Less than 6 months old? NO    If YES to any of the Flu questions above - NO Flu Vaccine to be given.  Patient may consult provider as needed.    If NO to all questions above - Patient may receive Flu Shot (IM)    Is the patient requesting Flumist? NO    If between 6 months and 39 years of age, was flu vaccine received last year?  N/A  If NO to above question:  . Children who are receiving influenza vaccine for the first time - administer 2 doses of the current influenza vaccine (separated by at least 4 weeks).        All patients are encouraged to wait 15 minutes before leaving after receiving any vaccine.    VIS given 03/03/2020 by Salley Scarlet -CMA

## 2020-03-03 NOTE — Patient Instructions (Signed)
It was great to see you in clinic today. Here is the plan as we discussed:    1. Ice the achilles tendon for 10 minutes at a time 3-5 times daily    2. Take the naproxen twice daily for 10 days. Take it with food and stop use if stomach pain develops.    3. Rest from aggravating activities until you are feeling better. You can try biking and swimming to see if that feels ok.    Please make an appointment to come back in 4-6 weeks. I look forward to seeing you then!

## 2020-03-03 NOTE — Progress Notes (Signed)
[facility] INITIAL VISIT  Patient: Sara Burnett  Date: 03/03/2020    Referred by: Evelena Peat, MD  Primary Care Physician:  Pcp, None    Chief Complaint   Patient presents with    Foot Pain     Patient was in [place] for 9 days, a lot of walking. No known injury.  Pain is always present, over the last 3 days the pain is worse.      HISTORY:    Sara Burnett is a 34 year old female who is presenting with right heel pain. She was seen in urgent car on 02/24/20 for pain in the right heel. The pain started after an 8 day trek in [place] where she was hiking 8 miles a day. She was diagnosed with right achilles tendonitis in urgent care and instructed to rest and use topical or oral NSAIDs as needed. The pain is worse now than when it started. She has been applying voltaren gel as needed and has been using a heel cup. She has not been applying ice. The pain is located over the achilles tendon and is worse with dorsiflexion and plantarflexion of the ankle. She was using [place] hiking boots during the trek though she has been wearing slides and running shoes since coming home. She denies prior injuries to the right ankle or pain in the achilles tendon. No numbness or tingling. She has chronic popping in her lateral ankle that is not associated with any pain.     Mechanism of injury:  Trek across [place]  Previous treatments: Voltaren gel, heel cup  Previous injuries: None    Past Medical History: I have reviewed and confirmed the past medical history in the chart.  Past Medical History:   Diagnosis Date    Pap smear abnormality of cervix 2018       Past Surgical History: I have reviewed and confirmed the past surgical history in the chart.  Past Surgical History:   Procedure Laterality Date    NO PRIOR SURGERIES         Medications: Reviewed medication list in the chart  Outpatient Medications Prior to Visit   Medication Sig Dispense Refill    amphetamine-dextroAMPHetamine ER  10 MG 24 hr capsule TAKE 1 CAPSULE BY MOUTH EVERY MORNING FOR ADHD      drospirenone-ethinyl estradiol 3-0.02 MG tablet TAKE 1 TABLET BY MOUTH EVERY DAY SKIPPING PLACEBO 112 tablet 0    triamcinolone 0.1 % ointment Apply peasized amount to opening of vagina every night until next appointment 15 g 3     No facility-administered medications prior to visit.        Allergies: Reviewed allergy section in the chart  Review of patient's allergies indicates:  No Known Allergies    Family History:   Family History     Problem (# of Occurrences) Relation (Name,Age of Onset)    Diabetes (2) Maternal Grandmother, Paternal Grandmother    Heart Disease (1) Other    Osteoporosis (1) Maternal Grandmother    Thyroid Disease (1) Mother       Negative family history of: Breast Cancer, Colon Cancer, Ovarian Cancer, Prostate Cancer, Uterine Cancer          Review of Systems: A 14-point review of systems was performed.  With the exception of the HPI, all other review of systems was negative.     PHYSICAL EXAM:    Vitals: BP 128/88    Pulse 94  Temp 36.8 C (Temporal)    Ht 5\' 4"  (1.626 m)    Wt 87.1 kg (192 lb)    SpO2 99%    BMI 32.96 kg/m   General: NAD, pleasant & cooperative  Head: EOMI, no facial lesions    Right Ankle:  Inspection: Edema: None, Ecchymoses: None, Deformity: None, Warmth: None  ROM: Active and passive ROM intact  Palpation: TTP over achilles tendon. Her peroneal tendon snaps over the lateral malleolus. No TTP over 5th metatarsal, medial and lateral malleolus, navicular, or talus.  No TTP over the peroneal, posterior tibial, anterior tibial.   Foot Arch:  Normal   Strength: 5/5 on dorsiflexion, plantar flexion, external rotation, internal rotation  Syndesmosis: Squeeze test Negative, Cotton test Negative  Special Tests: Talar tilt Negative, Anterior Drawer Negative, Thompson test normal  Functional Testing: Single leg toe raise: Causes pain in achilles tendon, Single leg hop test: Normal  Neurovascular: Normal  distal pulses and sensation.    Skin: No ulcers, erythema, or skin breakdown  Psych: Alert, appropriate affect    IMAGING:  No recent imaging.     ASSESSMENT:    Sara Burnett is a 34 year old female who presents with right ankle pain. History and examination are concerning for achilles tendonitis. Recommend continued use of the heel lift, relative rest, ice therapy and a course of Naproxen. If she does not start to improve in the next 1-2 weeks then she will be a good candidate for formal physical therapy. She was also noted to have a subluxing peroneal tendon on exam today that is asymptomatic. Recommend follow up if pain develops in the lateral ankle.     RECOMMENDATIONS & PLAN:    Given our clinical suspicion of the above diagnosis, our initial plan is as follows:    1. Achilles tendinitis, right leg  - naproxen 500 MG tablet; Take 1 tablet (500 mg) by mouth 2 times a day as needed for pain. Take with food.  Dispense: 20 tablet; Refill: 0  - Ice therapy for 10 minutes 3-5 times daily     2. Need for prophylactic vaccination and inoculation against influenza  - influenza quadrivalent PF MDCK vaccine (Flucelvax) syringe 0.5 mL      Follow-Up: Return in about 1 month (around 04/02/2020).    ---------------------------------------------------------------     I spent a total of 30 minutes for the patient's care on the date of the service.      Hermina Staggers, MD  Division of Sports Medicine   Department of [facility] Medicine   [facility] Medicine

## 2020-03-03 NOTE — Progress Notes (Signed)
Chief Complaint:   OBESITY Alison Davies is here to discuss her progress with her obesity treatment plan along with follow-up of her obesity related diagnoses. Alison Davies is on the Category 3 Plan and states she is following her eating plan approximately 40% of the time. Alison Davies states she is exercising 0 minutes 0 times per week.  Today's visit was #: 44 Starting weight: 199 lbs Starting date: 07/26/2017 Today's weight: 182 lbs Today's date: 03/03/2020 Total lbs lost to date: 17 Total lbs lost since last in-office visit: 5  Interim History: [name] states that she has been making more of her meals. She splurged on a girls' night with food and alcohol. She has not yet started her 59.  Subjective:   Other hyperlipidemia. [name] has hyperlipidemia and has been trying to improve her cholesterol levels with intensive lifestyle modification including a low saturated fat diet, exercise and weight loss. She denies any chest pain, claudication or myalgias. Tinea is on no medication. No chest pain. She is not currently exercising.  Lab Results  Component Value Date   ALT 17 10/23/2019   AST 16 10/23/2019   ALKPHOS 72 10/23/2019   BILITOT 0.5 10/23/2019   Lab Results  Component Value Date   CHOL 204 (H) 10/23/2019   HDL 43 10/23/2019   LDLCALC 141 (H) 10/23/2019   TRIG 110 10/23/2019   CHOLHDL 5 06/12/2019   Insulin resistance. Alison Davies has a diagnosis of insulin resistance based on her elevated fasting insulin level >5. She continues to work on diet and exercise to decrease her risk of diabetes. Alison Davies is on no medication and denies polyphagia. She is not exercising currently.  Lab Results  Component Value Date   INSULIN 11.1 10/23/2019   INSULIN 15.4 06/13/2019   INSULIN 14.4 11/13/2018   INSULIN 21.8 04/19/2018   INSULIN 18.0 12/19/2017   Lab Results  Component Value Date   HGBA1C 4.8 10/23/2019   Assessment/Plan:   Other hyperlipidemia. Cardiovascular risk and specific  lipid/LDL goals reviewed.  We discussed several lifestyle modifications today and Haidyn will continue to work on diet, restart exercise, and continue weight loss efforts. Orders and follow up as documented in patient record.   Counseling Intensive lifestyle modifications are the first line treatment for this issue. . Dietary changes: Increase soluble fiber. Decrease simple carbohydrates. . Exercise changes: Moderate to vigorous-intensity aerobic activity 150 minutes per week if tolerated. . Lipid-lowering medications: see documented in medical record.  Insulin resistance. Alison Davies will continue to work on weight loss, exercise, and decreasing simple carbohydrates to help decrease the risk of diabetes. Alison Davies agreed to follow-up with [name] as directed to closely monitor her progress.  Class 1 obesity with serious comorbidity and body mass index (BMI) of 31.0 to 31.9 in adult, unspecified obesity type.  Alison Davies is currently in the action stage of change. As such, her goal is to continue with weight loss efforts. She has agreed to the Category 3 Plan.   Exercise goals: For substantial health benefits, adults should do at least 150 minutes (2 hours and 30 minutes) a week of moderate-intensity, or 75 minutes (1 hour and 15 minutes) a week of vigorous-intensity aerobic physical activity, or an equivalent combination of moderate- and vigorous-intensity aerobic activity. Aerobic activity should be performed in episodes of at least 10 minutes, and preferably, it should be spread throughout the week.  Behavioral modification strategies: decreasing eating out and meal planning and cooking strategies.  Alison Davies has agreed to follow-up with our  clinic in 2 weeks. She was informed of the importance of frequent follow-up visits to maximize her success with intensive lifestyle modifications for her multiple health conditions.   Objective:   Blood pressure 104/71, pulse 83, temperature 98.9 F (37.2 C), height 5\' 4"   (1.626 m), weight 182 lb (82.6 kg), SpO2 98 %. Body mass index is 31.24 kg/m.  General: Cooperative, alert, well developed, in no acute distress. HEENT: Conjunctivae and lids unremarkable. Cardiovascular: Regular rhythm.  Lungs: Normal work of breathing. Neurologic: No focal deficits.   Lab Results  Component Value Date   CREATININE 0.61 10/23/2019   BUN 13 10/23/2019   NA 137 10/23/2019   K 4.4 10/23/2019   CL 104 10/23/2019   CO2 23 10/23/2019   Lab Results  Component Value Date   ALT 17 10/23/2019   AST 16 10/23/2019   ALKPHOS 72 10/23/2019   BILITOT 0.5 10/23/2019   Lab Results  Component Value Date   HGBA1C 4.8 10/23/2019   HGBA1C 4.8 06/13/2019   HGBA1C 5.0 11/13/2018   HGBA1C 4.9 04/19/2018   HGBA1C 4.7 (L) 12/19/2017   Lab Results  Component Value Date   INSULIN 11.1 10/23/2019   INSULIN 15.4 06/13/2019   INSULIN 14.4 11/13/2018   INSULIN 21.8 04/19/2018   INSULIN 18.0 12/19/2017   Lab Results  Component Value Date   TSH 1.850 07/26/2017   Lab Results  Component Value Date   CHOL 204 (H) 10/23/2019   HDL 43 10/23/2019   LDLCALC 141 (H) 10/23/2019   TRIG 110 10/23/2019   CHOLHDL 5 06/12/2019   Lab Results  Component Value Date   WBC 7.1 06/12/2019   HGB 14.0 06/12/2019   HCT 41.0 06/12/2019   MCV 89.7 06/12/2019   PLT 299.0 06/12/2019   No results found for: IRON, TIBC, FERRITIN  Attestation Statements:   Reviewed by clinician on day of visit: allergies, medications, problem list, medical history, surgical history, family history, social history, and previous encounter notes.  Time spent on visit including pre-visit chart review and post-visit charting and care was 32 minutes.   I04/01/2020, am acting as transcriptionist for Alison Payment, PA-C   I have reviewed the above documentation for accuracy and completeness, and I agree with the above. [name], PA-C

## 2020-03-20 ENCOUNTER — Encounter: Payer: Self-pay | Admitting: Family Medicine

## 2020-03-20 ENCOUNTER — Other Ambulatory Visit: Payer: Self-pay | Admitting: Family Medicine

## 2020-03-20 ENCOUNTER — Other Ambulatory Visit: Payer: Self-pay

## 2020-03-20 ENCOUNTER — Ambulatory Visit (INDEPENDENT_AMBULATORY_CARE_PROVIDER_SITE_OTHER): Payer: No Typology Code available for payment source | Admitting: Family Medicine

## 2020-03-20 VITALS — BP 118/76 | HR 76 | Temp 97.6°F | Ht 64.0 in | Wt 185.2 lb

## 2020-03-20 DIAGNOSIS — F411 Generalized anxiety disorder: Secondary | ICD-10-CM | POA: Diagnosis not present

## 2020-03-20 DIAGNOSIS — R399 Unspecified symptoms and signs involving the genitourinary system: Secondary | ICD-10-CM | POA: Diagnosis not present

## 2020-03-20 DIAGNOSIS — Z118 Encounter for screening for other infectious and parasitic diseases: Secondary | ICD-10-CM

## 2020-03-20 LAB — POCT URINALYSIS DIPSTICK
Bilirubin, UA: NEGATIVE
Glucose, UA: NEGATIVE
Ketones, UA: NEGATIVE
Nitrite, UA: NEGATIVE
Protein, UA: NEGATIVE
Spec Grav, UA: >=1.03 — AB (ref 1.010–1.025)
Urobilinogen, UA: NEGATIVE E.U./dL — AB
pH, UA: 6 (ref 5.0–8.0)

## 2020-03-20 MED ORDER — NITROFURANTOIN MONOHYD MACRO 100 MG PO CAPS: 100.0000 mg | ORAL_CAPSULE | Freq: Two times a day (BID) | ORAL | 0 refills | Status: DC

## 2020-03-20 MED ORDER — ALPRAZOLAM 0.25 MG PO TABS: 0.2500 mg | ORAL_TABLET | Freq: Every evening | ORAL | 0 refills | Status: DC | PRN

## 2020-03-20 MED FILL — NITROFURANTOIN MONO-MCR 100: 100 | 7 days supply | Qty: 14 | Fill #0

## 2020-03-20 MED FILL — ALPRAZolam 0.25 MG TABS: 0.25 | 30 days supply | Qty: 30 | Fill #0

## 2020-03-20 NOTE — Progress Notes (Signed)
Alison Davies is a 34 y.o. female  Chief Complaint  Patient presents with  . Acute Visit    urine frequency, bladder fullness x 1 week.       HPI: Alison Davies is a 34 y.o. female who complains of 1 week h/o urinary frequency and bladder fullness. No gross hematuria. No dysuria. No urinary odor. No urgency.  No vaginal itching, odor, discharge.  Fever, chills - no N/v - mild nausea, no vomiting  New/worseening back pain - no  She needs a refill of xanax 0.25mg  qHS PRN. She last filled 30 tabs in 06/2019.    Past Medical History:  Diagnosis Date  . Anxiety   . Hyperlipidemia   . Leg edema   . Migraines   . Vaginal Pap smear, abnormal 06/12/2019   LGSIL +HPV    History reviewed. No pertinent surgical history.  Social History   Socioeconomic History  . Marital status: Single    Spouse name: Not on file  . Number of children: Not on file  . Years of education: Not on file  . Highest education level: Not on file  Occupational History  . Occupation: Rad Theme park manager: [employer]  Tobacco Use  . Smoking status: Never Smoker  . Smokeless tobacco: Never Used  Vaping Use  . Vaping Use: Never used  Substance and Sexual Activity  . Alcohol use: Yes    Comment: occasional  . Drug use: Never  . Sexual activity: Yes    Birth control/protection: Pill  Other Topics Concern  . Not on file  Social History Narrative  . Not on file   Social Determinants of Health   Financial Resource Strain:   . Difficulty of Paying Living Expenses: Not on file  Food Insecurity:   . Worried About Programme researcher, broadcasting/film/video in the Last Year: Not on file  . Ran Out of Food in the Last Year: Not on file  Transportation Needs:   . Lack of Transportation (Medical): Not on file  . Lack of Transportation (Non-Medical): Not on file  Physical Activity:   . Days of Exercise per Week: Not on file  . Minutes of Exercise per Session: Not on file  Stress:   . Feeling of Stress : Not on file    Social Connections:   . Frequency of Communication with Friends and Family: Not on file  . Frequency of Social Gatherings with Friends and Family: Not on file  . Attends Religious Services: Not on file  . Active Member of Clubs or Organizations: Not on file  . Attends Banker Meetings: Not on file  . Marital Status: Not on file  Intimate Partner Violence:   . Fear of Current or Ex-Partner: Not on file  . Emotionally Abused: Not on file  . Physically Abused: Not on file  . Sexually Abused: Not on file    Family History  Problem Relation Age of Onset  . Anxiety disorder Mother   . Diabetes Father   . Obesity Father      Immunization History  Administered Date(s) Administered  . Influenza,inj,Quad PF,6+ Mos 01/16/2020  . Influenza-Unspecified 01/17/2014  . PFIZER SARS-COV-2 Vaccination 05/30/2019, 06/18/2019, 02/18/2020  . PPD Test 02/24/2014, 03/06/2014, 03/27/2015, 03/28/2016  . Tdap 02/17/2014, 03/04/2018    Outpatient Encounter Medications as of 03/20/2020  Medication Sig  . ALPRAZolam (XANAX) 0.25 MG tablet Take 1 tablet (0.25 mg total) by mouth at bedtime as needed for anxiety.  Marland Kitchen  Biotin 3 MG TABS Take 2 tablets by mouth daily.  . Melatonin 5 MG CHEW Chew 2 capsules by mouth at bedtime as needed.  . Multiple Vitamin (MULTIVITAMIN WITH MINERALS) TABS tablet Take 1 tablet by mouth daily.  . norethindrone (MICRONOR) 0.35 MG tablet TAKE 1 TABLET (0.35 MG TOTAL) BY MOUTH DAILY.  Marland Kitchen Semaglutide-Weight Management (WEGOVY) 0.25 MG/0.5ML SOAJ Inject 0.25 mg into the skin once a week.  . sertraline (ZOLOFT) 100 MG tablet Take 1 tablet (100 mg total) by mouth daily.  . SUMAtriptan (IMITREX) 25 MG tablet Take 1 tablet (25 mg total) by mouth every 2 (two) hours as needed for migraine. May repeat in 2 hours if headache persists or recurs.  . topiramate (TOPAMAX) 100 MG tablet Take 1 tablet (100 mg total) by mouth at bedtime.  . Turmeric (QC TUMERIC COMPLEX PO) Take by  mouth.  . vitamin C (ASCORBIC ACID) 250 MG tablet Take 250 mg by mouth daily.  . Vitamin D, Ergocalciferol, (DRISDOL) 1.25 MG (50000 UNIT) CAPS capsule Take 1 capsule (50,000 Units total) by mouth every 7 (seven) days.  . Cyanocobalamin (VITAMIN B-12 CR) 1500 MCG TBCR Take 2 tablets by mouth 2 (two) times daily. (Patient not taking: Reported on 03/20/2020)   No facility-administered encounter medications on file as of 03/20/2020.     ROS: Pertinent positives and negatives noted in HPI. Remainder of ROS non-contributory   No Known Allergies  BP 118/76   Pulse 76   Temp 97.6 F (36.4 C) (Temporal)   Ht 5\' 4"  (1.626 m)   Wt 185 lb 3.2 oz (84 kg)   SpO2 97%   BMI 31.79 kg/m   Physical Exam Constitutional:      General: She is not in acute distress.    Appearance: Normal appearance. She is not ill-appearing.  Pulmonary:     Effort: No respiratory distress.  Neurological:     Mental Status: She is alert and oriented to person, place, and time.  Psychiatric:        Mood and Affect: Mood normal.        Behavior: Behavior normal.     Component     Latest Ref Rng & Units 03/20/2020  Color, UA      yellow  Clarity, UA      clear  Glucose     Negative Negative  Bilirubin, UA      neg  Ketones, UA      neg  Specific Gravity, UA     1.010 - 1.025 >=1.030 (A)  RBC, UA      2+  pH, UA     5.0 - 8.0 6.0  Protein,UA     Negative Negative  Urobilinogen, UA     0.2 or 1.0 E.U./dL negative (A)  Nitrite, UA      neg  Leukocytes,UA     Negative Small (1+) (A)     A/P:  1. UTI symptoms - POCT Urinalysis Dipstick - Urine Culture Rx: - nitrofurantoin, macrocrystal-monohydrate, (MACROBID) 100 MG capsule; Take 1 capsule (100 mg total) by mouth 2 (two) times daily for 7 days.  Dispense: 14 capsule; Refill: 0 - increase water intake  2. Screening for chlamydial disease - pt requests testing - Urine cytology ancillary only([place])  3. GAD (generalized anxiety  disorder) - stable, controlled - database reviewed and appropriate Refill: - ALPRAZolam (XANAX) 0.25 MG tablet; Take 1 tablet (0.25 mg total) by mouth at bedtime as needed for anxiety.  Dispense: 30  tablet; Refill: 0    This visit occurred during the SARS-CoV-2 public health emergency.  Safety protocols were in place, including screening questions prior to the visit, additional usage of staff PPE, and extensive cleaning of exam room while observing appropriate contact time as indicated for disinfecting solutions.

## 2020-03-24 ENCOUNTER — Encounter: Payer: Self-pay | Admitting: Family Medicine

## 2020-03-24 ENCOUNTER — Other Ambulatory Visit (INDEPENDENT_AMBULATORY_CARE_PROVIDER_SITE_OTHER): Payer: Self-pay | Admitting: Physician Assistant

## 2020-03-24 ENCOUNTER — Ambulatory Visit (INDEPENDENT_AMBULATORY_CARE_PROVIDER_SITE_OTHER): Payer: No Typology Code available for payment source | Admitting: Physician Assistant

## 2020-03-24 ENCOUNTER — Other Ambulatory Visit: Payer: Self-pay

## 2020-03-24 ENCOUNTER — Encounter (INDEPENDENT_AMBULATORY_CARE_PROVIDER_SITE_OTHER): Payer: Self-pay | Admitting: Physician Assistant

## 2020-03-24 VITALS — BP 102/71 | HR 90 | Temp 98.6°F | Ht 64.0 in | Wt 182.0 lb

## 2020-03-24 DIAGNOSIS — Z9189 Other specified personal risk factors, not elsewhere classified: Secondary | ICD-10-CM | POA: Diagnosis not present

## 2020-03-24 DIAGNOSIS — R739 Hyperglycemia, unspecified: Secondary | ICD-10-CM

## 2020-03-24 DIAGNOSIS — E7849 Other hyperlipidemia: Secondary | ICD-10-CM | POA: Diagnosis not present

## 2020-03-24 DIAGNOSIS — E669 Obesity, unspecified: Secondary | ICD-10-CM | POA: Diagnosis not present

## 2020-03-24 DIAGNOSIS — E559 Vitamin D deficiency, unspecified: Secondary | ICD-10-CM

## 2020-03-24 DIAGNOSIS — Z6831 Body mass index (BMI) 31.0-31.9, adult: Secondary | ICD-10-CM

## 2020-03-24 MED ORDER — WEGOVY 0.5 MG/0.5ML ~~LOC~~ SOAJ: 0.5000 mg | SUBCUTANEOUS | 0 refills | Status: DC

## 2020-03-24 MED ORDER — VITAMIN D (ERGOCALCIFEROL) 1.25 MG (50000 UNIT) PO CAPS: 50000.0000 [IU] | ORAL_CAPSULE | ORAL | 0 refills | Status: DC

## 2020-03-24 MED FILL — WEGOVY 0.5 MG/0.5ML SOAJ: 0.5 | 28 days supply | Qty: 2 | Fill #0

## 2020-03-24 MED FILL — VIT D2 1.25 MG (50,000 UNIT: 1.25 MG | 28 days supply | Qty: 4 | Fill #0

## 2020-03-25 LAB — LIPID PANEL
Chol/HDL Ratio: 5.1 ratio — ABNORMAL HIGH (ref 0.0–4.4)
Cholesterol, Total: 220 mg/dL — ABNORMAL HIGH (ref 100–199)
HDL: 43 mg/dL (ref >39)
LDL Chol Calc (NIH): 159 mg/dL — ABNORMAL HIGH (ref 0–99)
Triglycerides: 99 mg/dL (ref 0–149)
VLDL Cholesterol Cal: 18 mg/dL (ref 5–40)

## 2020-03-25 LAB — COMPREHENSIVE METABOLIC PANEL
ALT: 14 IU/L (ref 0–32)
AST: 10 IU/L (ref 0–40)
Albumin/Globulin Ratio: 1.4 (ref 1.2–2.2)
Albumin: 4.2 g/dL (ref 3.8–4.8)
Alkaline Phosphatase: 79 IU/L (ref 44–121)
BUN/Creatinine Ratio: 21 (ref 9–23)
BUN: 14 mg/dL (ref 6–20)
Bilirubin Total: 0.4 mg/dL (ref 0.0–1.2)
CO2: 22 mmol/L (ref 20–29)
Calcium: 9.2 mg/dL (ref 8.7–10.2)
Chloride: 108 mmol/L — ABNORMAL HIGH (ref 96–106)
Creatinine, Ser: 0.66 mg/dL (ref 0.57–1.00)
GFR calc Af Amer: 135 mL/min/{1.73_m2} (ref >59)
GFR calc non Af Amer: 117 mL/min/{1.73_m2} (ref >59)
Globulin, Total: 3 g/dL (ref 1.5–4.5)
Glucose: 99 mg/dL (ref 65–99)
Potassium: 4.6 mmol/L (ref 3.5–5.2)
Sodium: 141 mmol/L (ref 134–144)
Total Protein: 7.2 g/dL (ref 6.0–8.5)

## 2020-03-25 LAB — HEMOGLOBIN A1C
Est. average glucose Bld gHb Est-mCnc: 94 mg/dL
Hgb A1c MFr Bld: 4.9 % (ref 4.8–5.6)

## 2020-03-25 LAB — VITAMIN D 25 HYDROXY (VIT D DEFICIENCY, FRACTURES): Vit D, 25-Hydroxy: 21.1 ng/mL — ABNORMAL LOW (ref 30.0–100.0)

## 2020-03-25 LAB — INSULIN, RANDOM: INSULIN: 18.6 u[IU]/mL (ref 2.6–24.9)

## 2020-03-25 NOTE — Progress Notes (Signed)
Chief Complaint:   Alison Davies Davies is here to discuss her progress with her Alison Davies treatment plan along with follow-up of her Alison Davies related diagnoses. Alison Davies Davies is on the Category 3 Plan and states she is following her eating plan approximately 70% of the time. Alison Davies Davies states she is exercising 0 minutes 0 times per week.  Today's visit was #: 45 Starting weight: 199 lbs Starting date: 07/26/2017 Today's weight: 182 lbs Today's date: 03/24/2020 Total lbs lost to date: 17 Total lbs lost since last in-office visit: 0  Interim History: Alison Davies Davies states that she feels that she could have cooked more often the last few weeks. The Reginal Lutes is helping her feel fuller faster.  Subjective:   Vitamin D deficiency. Alison Davies Davies is on prescription Vitamin D weekly. Last level was not at goal.   Ref. Range 10/23/2019 14:17  Vitamin D, 25-Hydroxy Latest Ref Range: 30.0 - 100.0 ng/mL 30.9   Hyperglycemia. Alison Davies Davies denies polyphagia or polyuria. She is on no medication. She is due for labs.  Other hyperlipidemia. Alison Davies Davies has hyperlipidemia and has been trying to improve her cholesterol levels with intensive lifestyle modification including a low saturated fat diet, exercise and weight loss. She denies any chest pain, claudication or myalgias. Alison Davies Davies is on no medication. Last total cholesterol and LDL levels were not at goal. She is due for labs.  At risk for heart disease. Alison Davies Davies is at a higher than average risk for cardiovascular disease due to Alison Davies.   Assessment/Plan:   Vitamin D deficiency. Low Vitamin D level contributes to fatigue and are associated with Alison Davies, breast, and colon cancer. She was given a refill on her Vitamin D, Ergocalciferol, (DRISDOL) 1.25 MG (50000 UNIT) CAPS capsule every week #4 with 0 refills and VITAMIN D 25 Hydroxy (Vit-D Deficiency, Fractures) level will be checked today.   Hyperglycemia. Alison Davies Davies will continue to follow her meal plan as directed. Labs will be checked today.    Other hyperlipidemia. Cardiovascular risk and specific lipid/LDL goals reviewed.  We discussed several lifestyle modifications today and Alison Davies Davies will continue to work on diet, exercise and weight loss efforts. Orders and follow up as documented in patient record. Labs will be checked today.   Counseling Intensive lifestyle modifications are the first line treatment for this issue. . Dietary changes: Increase soluble fiber. Decrease simple carbohydrates. . Exercise changes: Moderate to vigorous-intensity aerobic activity 150 minutes per week if tolerated. . Lipid-lowering medications: see documented in medical record.  At risk for heart disease. Alison Davies Davies was given approximately 15 minutes of coronary artery disease prevention counseling today. She is 34 y.o. female and has risk factors for heart disease including Alison Davies. We discussed intensive lifestyle modifications today with an emphasis on specific weight loss instructions and strategies.   Repetitive spaced learning was employed today to elicit superior memory formation and behavioral change.  Class 1 Alison Davies with serious comorbidity and body mass index (BMI) of 31.0 to 31.9 in adult, unspecified Alison Davies type. Alison Davies Davies will increase Semaglutide-Weight Management (WEGOVY) 0.5 MG/0.5ML SOAJ to 0.5 mg weekly #4 pens with 0 refills.  Alison Davies Davies is currently in the action stage of change. As such, her goal is to continue with weight loss efforts. She has agreed to the Category 3 Plan.   Exercise goals: For substantial health benefits, adults should do at least 150 minutes (2 hours and 30 minutes) a week of moderate-intensity, or 75 minutes (1 hour and 15 minutes) a week of vigorous-intensity aerobic physical activity, or an equivalent combination  of moderate- and vigorous-intensity aerobic activity. Aerobic activity should be performed in episodes of at least 10 minutes, and preferably, it should be spread throughout the week.  Behavioral modification  strategies: avoiding temptations and keeping a strict food journal.  Alison Davies has agreed to follow-up with our clinic in 3 weeks. She was informed of the importance of frequent follow-up visits to maximize her success with intensive lifestyle modifications for her multiple health conditions.   Alison Davies Davies was informed we would discuss her lab results at her next visit unless there is a critical issue that needs to be addressed sooner. Alison Davies Davies agreed to keep her next visit at the agreed upon time to discuss these results.  Objective:   Blood pressure 102/71, pulse 90, temperature 98.6 F (37 C), height 5\' 4"  (1.626 m), weight 182 lb (82.6 kg), SpO2 99 %. Body mass index is 31.24 kg/m.  General: Cooperative, alert, well developed, in no acute distress. HEENT: Conjunctivae and lids unremarkable. Cardiovascular: Regular rhythm.  Lungs: Normal work of breathing. Neurologic: No focal deficits.   Lab Results  Component Value Date   HGBA1C 4.8 10/23/2019   HGBA1C 4.8 06/13/2019   HGBA1C 5.0 11/13/2018   HGBA1C 4.9 04/19/2018   Lab Results  Component Value Date   INSULIN 11.1 10/23/2019   INSULIN 15.4 06/13/2019   INSULIN 14.4 11/13/2018   INSULIN 21.8 04/19/2018   Lab Results  Component Value Date   TSH 1.850 07/26/2017   Lab Results  Component Value Date   WBC 7.1 06/12/2019   HGB 14.0 06/12/2019   HCT 41.0 06/12/2019   MCV 89.7 06/12/2019   PLT 299.0 06/12/2019   No results found for: IRON, TIBC, FERRITIN  Attestation Statements:   Reviewed by clinician on day of visit: allergies, medications, problem list, medical history, surgical history, family history, social history, and previous encounter notes.  I04/01/2020, am acting as transcriptionist for Marianna Payment, PA-C   I have reviewed the above documentation for accuracy and completeness, and I agree with the above. [name], PA-C

## 2020-04-05 NOTE — Progress Notes (Signed)
[facility]  PROGRESS NOTE        Date: 04/06/2020  Primary Care Physician:  Pcp, None    Chief Complaint   Patient presents with    Follow-Up      patient reporst right achilles is almost fully restored, no cracking or pain.        Interval Events:  Sara Burnett is a 34 year old female presenting for follow-up of right achilles tendonitis.  Last clinic visit was 03/03/20 where we discussed naproxen, rest and and ice therapy . Since her last appointment, she has had improvement in her pain. She stopped taking NSAIDs on Saturday. She was in [place] for 3 weeks, so she was standing a lot though was able to otherwise rest. She is planning on returning to boxing this week.     The patient has no additional complaints at this time and otherwise feels well.    Past Medical History:  Past Medical History:   Diagnosis Date    Pap smear abnormality of cervix 2018       Past Surgical History:  Past Surgical History:   Procedure Laterality Date    NO PRIOR SURGERIES         Medications:  Current Outpatient Medications   Medication Instructions    amphetamine-dextroAMPHetamine ER 10 MG 24 hr capsule TAKE 1 CAPSULE BY MOUTH EVERY MORNING FOR ADHD    drospirenone-ethinyl estradiol 3-0.02 MG tablet TAKE 1 TABLET BY MOUTH EVERY DAY SKIPPING PLACEBO    naproxen 500 mg, Oral, 2 times daily PRN, Take with food.    triamcinolone 0.1 % ointment Apply peasized amount to opening of vagina every night until next appointment       Allergies: Review of patient's allergies indicates:  No Known Allergies    Review of Systems: A 14-point review of systems was performed.  With the exception of the HPI, all other review of systems was negative.     PHYSICAL EXAM:    Vitals: BP 126/86    Pulse 69    Temp 36.4 C (Temporal)    Ht 5\' 4"  (1.626 m)    Wt 87.1 kg (192 lb)    SpO2 98%    BMI 32.96 kg/m   General: NAD, pleasant & cooperative  Head: EOMI, no facial lesions    Right Ankle:  Inspection: Edema: None, Ecchymoses:  None, Deformity: None, Warmth: None  ROM: Active and passive ROM normal  Palpation: .  No tenderness over 5th metatarsal, medial and lateral malleolus, navicular, or talus.  No tenderness over the peroneal, posterior tibial, anterior tibial, and Achilles' tendons. Noted snapping of the peroneal tendon with no symptoms.  Foot Arch:  Normal  Strength: 5/5 on dorsiflexion, plantar flexion, external rotation, internal rotation  Syndesmosis: Squeeze test Normal, Cotton test Normal  Special Tests: Talar tilt Normal, Anterior Drawer Normal, Thompson test normal  Neurovascular: Normal distal pulses and sensation.    Skin: No ulcers, erythema, or skin breakdown  Psych: Alert, appropriate affect    IMAGING:    No recent imaging.    ASSESSMENT:    Sara Burnett is a 34 year old female who presents for follow-up of right achilles tendonitis. Her symptoms have improved with relative rest, ice therapy and ibuprofen. I recommend a gradual return to normal activities with follow up in clinic as needed. She was again noted to have a subluxing peroneal tendon on exam today that is asymptomatic. Recommend follow up if pain develops in the  lateral ankle.     RECOMMENDATIONS & PLAN:    1. Achilles tendinitis, right leg  - Gradual return to activity   - Return to clinic as needed     Follow-Up: Return if symptoms worsen or fail to improve.    The above recommendations were communicated to the patient who expressed understanding.    I spent a total of 20 minutes for the patient's care on the date of the service.    Hermina Staggers, MD   Division of Sports Medicine   Department of [facility] Medicine   [facility] Medicine

## 2020-04-06 ENCOUNTER — Ambulatory Visit (INDEPENDENT_AMBULATORY_CARE_PROVIDER_SITE_OTHER): Payer: No Typology Code available for payment source | Admitting: Sports Medicine

## 2020-04-06 VITALS — BP 126/86 | HR 69 | Temp 97.5°F | Ht 64.0 in | Wt 192.0 lb

## 2020-04-06 DIAGNOSIS — M7661 Achilles tendinitis, right leg: Secondary | ICD-10-CM

## 2020-04-15 ENCOUNTER — Other Ambulatory Visit (INDEPENDENT_AMBULATORY_CARE_PROVIDER_SITE_OTHER): Payer: Self-pay | Admitting: Physician Assistant

## 2020-04-15 ENCOUNTER — Ambulatory Visit (INDEPENDENT_AMBULATORY_CARE_PROVIDER_SITE_OTHER): Payer: No Typology Code available for payment source | Admitting: Physician Assistant

## 2020-04-15 ENCOUNTER — Other Ambulatory Visit: Payer: Self-pay

## 2020-04-15 ENCOUNTER — Encounter (INDEPENDENT_AMBULATORY_CARE_PROVIDER_SITE_OTHER): Payer: Self-pay | Admitting: Physician Assistant

## 2020-04-15 VITALS — BP 112/75 | HR 97 | Temp 98.2°F | Ht 64.0 in | Wt 181.0 lb

## 2020-04-15 DIAGNOSIS — Z6831 Body mass index (BMI) 31.0-31.9, adult: Secondary | ICD-10-CM

## 2020-04-15 DIAGNOSIS — E7849 Other hyperlipidemia: Secondary | ICD-10-CM

## 2020-04-15 DIAGNOSIS — E669 Obesity, unspecified: Secondary | ICD-10-CM

## 2020-04-15 DIAGNOSIS — E559 Vitamin D deficiency, unspecified: Secondary | ICD-10-CM

## 2020-04-15 DIAGNOSIS — Z9189 Other specified personal risk factors, not elsewhere classified: Secondary | ICD-10-CM

## 2020-04-15 MED ORDER — ERGOCALCIFEROL 1.25 MG (50000 UT) PO CAPS: 50000.0000 [IU] | ORAL_CAPSULE | ORAL | 0 refills | Status: DC

## 2020-04-15 MED ORDER — WEGOVY 0.5 MG/0.5ML ~~LOC~~ SOAJ: 0.5000 mg | SUBCUTANEOUS | 0 refills | Status: DC

## 2020-04-15 MED FILL — WEGOVY 0.5 MG/0.5ML SOAJ: 0.5 | 28 days supply | Qty: 2 | Fill #0

## 2020-04-15 MED FILL — VIT D2 1.25 MG (50,000 UNIT: 1.25 MG | 28 days supply | Qty: 10 | Fill #0

## 2020-04-15 NOTE — Progress Notes (Signed)
Chief Complaint:   OBESITY Alison Davies is here to discuss her progress with her obesity treatment plan along with follow-up of her obesity related diagnoses. Alison Davies is on the Category 3 Plan and states she is following her eating plan approximately 75% of the time. Alison Davies states she is not exercising at this time.  Today's visit was #: 46 Starting weight: 199 lbs Starting date: 07/26/2017 Today's weight: 181 lbs Today's date: 04/15/2020 Total lbs lost to date: 18 lbs Total lbs lost since last in-office visit: 1 lb  Interim History: Alison Davies reports that she did well with her eating on Thanksgiving.  She ate a lot of protein and portion controlled desserts.  Since then she has been doing some stress eating.  She is journaling during the week and doing Category 4 on the weekends while working.  She is on Wegovy 0.5 mg weekly.  Subjective:   1. Vitamin D deficiency Alison Davies's Vitamin D level was 21.1 on 03/24/2020. She is currently taking prescription vitamin D 50,000 IU once per week. She denies nausea, vomiting or muscle weakness.  Vitamin D level is not at goal.  She missed 4 weeks of doses prior to labs.  2. Other hyperlipidemia Alison Davies has hyperlipidemia and has been trying to improve her cholesterol levels with intensive lifestyle modification including a low saturated fat diet, exercise and weight loss. She denies any chest pain, claudication or myalgias. No medications.  Last lipid panel not at goal.    Lab Results  Component Value Date   ALT 14 03/24/2020   AST 10 03/24/2020   ALKPHOS 79 03/24/2020   BILITOT 0.4 03/24/2020   Lab Results  Component Value Date   CHOL 220 (H) 03/24/2020   HDL 43 03/24/2020   LDLCALC 159 (H) 03/24/2020   TRIG 99 03/24/2020   CHOLHDL 5.1 (H) 03/24/2020   3. At risk for heart disease Alison Davies is at a higher than average risk for cardiovascular disease due to obesity.   Assessment/Plan:   1. Vitamin D deficiency Worsening.  Discussed labs with patient  today.  Low Vitamin D level contributes to fatigue and are associated with obesity, breast, and colon cancer. She agrees to increase prescription Vitamin D @50 ,000 IU twice per week and will follow-up for routine testing of Vitamin D, at least 2-3 times per year to avoid over-replacement.  2. Other hyperlipidemia Discussed labs with patient today.  Cardiovascular risk and specific lipid/LDL goals reviewed.  We discussed several lifestyle modifications today and Alison Davies will continue to work on diet, exercise and weight loss efforts. Orders and follow up as documented in patient record.  Continue with weight loss.  PCP to review lipids.  Counseling Intensive lifestyle modifications are the first line treatment for this issue. . Dietary changes: Increase soluble fiber. Decrease simple carbohydrates. . Exercise changes: Moderate to vigorous-intensity aerobic activity 150 minutes per week if tolerated. . Lipid-lowering medications: see documented in medical record.  3. At risk for heart disease Alison Davies was given approximately 15 minutes of coronary artery disease prevention counseling today. She is 34 y.o. female and has risk factors for heart disease including obesity. We discussed intensive lifestyle modifications today with an emphasis on specific weight loss instructions and strategies.   Repetitive spaced learning was employed today to elicit superior memory formation and behavioral change.  4. Class 1 obesity with serious comorbidity and body mass index (BMI) of 31.0 to 31.9 in adult, unspecified obesity type  Alison Davies is currently in the action stage of  change. As such, her goal is to continue with weight loss efforts. She has agreed to the Category 3 Plan and keeping a food journal and adhering to recommended goals of 1400-1500 calories and 95 grams of protein.   Exercise goals: For substantial health benefits, adults should do at least 150 minutes (2 hours and 30 minutes) a week of  moderate-intensity, or 75 minutes (1 hour and 15 minutes) a week of vigorous-intensity aerobic physical activity, or an equivalent combination of moderate- and vigorous-intensity aerobic activity. Aerobic activity should be performed in episodes of at least 10 minutes, and preferably, it should be spread throughout the week.  Behavioral modification strategies: increasing lean protein intake, decreasing simple carbohydrates, planning for success and keeping a strict food journal.  Alison Davies has agreed to follow-up with our clinic in 3 weeks. She was informed of the importance of frequent follow-up visits to maximize her success with intensive lifestyle modifications for her multiple health conditions.   Objective:   Blood pressure 112/75, pulse 97, temperature 98.2 F (36.8 C), height 5\' 4"  (1.626 m), weight 181 lb (82.1 kg), SpO2 98 %. Body mass index is 31.07 kg/m.  General: Cooperative, alert, well developed, in no acute distress. HEENT: Conjunctivae and lids unremarkable. Cardiovascular: Regular rhythm.  Lungs: Normal work of breathing. Neurologic: No focal deficits.   Lab Results  Component Value Date   CREATININE 0.66 03/24/2020   BUN 14 03/24/2020   NA 141 03/24/2020   K 4.6 03/24/2020   CL 108 (H) 03/24/2020   CO2 22 03/24/2020   Lab Results  Component Value Date   ALT 14 03/24/2020   AST 10 03/24/2020   ALKPHOS 79 03/24/2020   BILITOT 0.4 03/24/2020   Lab Results  Component Value Date   HGBA1C 4.9 03/24/2020   HGBA1C 4.8 10/23/2019   HGBA1C 4.8 06/13/2019   HGBA1C 5.0 11/13/2018   HGBA1C 4.9 04/19/2018   Lab Results  Component Value Date   INSULIN 18.6 03/24/2020   INSULIN 11.1 10/23/2019   INSULIN 15.4 06/13/2019   INSULIN 14.4 11/13/2018   INSULIN 21.8 04/19/2018   Lab Results  Component Value Date   TSH 1.850 07/26/2017   Lab Results  Component Value Date   CHOL 220 (H) 03/24/2020   HDL 43 03/24/2020   LDLCALC 159 (H) 03/24/2020   TRIG 99 03/24/2020    CHOLHDL 5.1 (H) 03/24/2020   Lab Results  Component Value Date   WBC 7.1 06/12/2019   HGB 14.0 06/12/2019   HCT 41.0 06/12/2019   MCV 89.7 06/12/2019   PLT 299.0 06/12/2019   Attestation Statements:   Reviewed by clinician on day of visit: allergies, medications, problem list, medical history, surgical history, family history, social history, and previous encounter notes.  I, 08/10/2019, CMA, am acting as transcriptionist for Insurance claims handler, PA-C  I have reviewed the above documentation for accuracy and completeness, and I agree with the above. [name], PA-C

## 2020-04-23 ENCOUNTER — Other Ambulatory Visit: Payer: Self-pay | Admitting: Family Medicine

## 2020-04-23 ENCOUNTER — Emergency Department: Admit: 2020-04-23 | Discharge status: Absent without leave | Payer: Self-pay

## 2020-04-23 ENCOUNTER — Emergency Department (INDEPENDENT_AMBULATORY_CARE_PROVIDER_SITE_OTHER)
Admission: EM | Admit: 2020-04-23 | Discharge: 2020-04-23 | Disposition: A | Discharge status: Home / Self Care | Payer: No Typology Code available for payment source | Source: Home / Self Care | Attending: Family Medicine | Admitting: Family Medicine

## 2020-04-23 ENCOUNTER — Other Ambulatory Visit: Payer: Self-pay

## 2020-04-23 DIAGNOSIS — M62830 Muscle spasm of back: Secondary | ICD-10-CM | POA: Diagnosis not present

## 2020-04-23 LAB — POCT URINE PREGNANCY: Preg Test, Ur: NEGATIVE

## 2020-04-23 MED ORDER — METAXALONE 800 MG PO TABS: 800.0000 mg | ORAL_TABLET | Freq: Three times a day (TID) | ORAL | 0 refills | Status: DC

## 2020-04-23 MED FILL — METAXALONE 800 MG TABLET: 800 | 7 days supply | Qty: 21 | Fill #0

## 2020-04-23 NOTE — ED Triage Notes (Addendum)
Pt c/o back pain/spasms that started yesterday. Ibuprofen, heat and tens unit tried with little relief.  Pain most on LT side. Denies urinary sxs. Menstrual cycle is 2 weeks late which is not abnormal for pt. Agreed to urine pregnancy. Pain 7/10

## 2020-04-23 NOTE — ED Provider Notes (Signed)
[facility]   [id number] 04/23/20 Arrival Time: 1033  ASSESSMENT & PLAN:  1. Muscle spasm of back     Able to ambulate here and hemodynamically stable. No indication for imaging of back at this time given no trauma and normal neurological exam.  TENS as needed. Ibuprofen 800mg  TID with food. Ensure mobility. Xanax QHS if needed. OTC lidocaine patch. Work note provided.  Begin trial of: Meds ordered this encounter  Medications   metaxalone (SKELAXIN) 800 MG tablet    Sig: Take 1 tablet (800 mg total) by mouth 3 (three) times daily.    Dispense:  21 tablet    Refill:  0   Recommend:  Follow-up Information    [facility].   Specialty: Urgent Care Why: If worsening or failing to improve as anticipated. Contact information: [address] [place] [phone]              Reviewed expectations re: course of current medical issues. Questions answered. Outlined signs and symptoms indicating need for more acute intervention. Patient verbalized understanding. After Visit Summary given.   SUBJECTIVE: History from: patient.  Alison Davies is a 34 y.o. female who presents with complaint of abrupt left lower back pain consistent with prior muscle spasm. No trauma. Noted while bending over. No extremity sensation changes or weakness. No abd pain. History of back problems requiring medical care: occasional. Pain described as aching and with occasional sharp pains with certain movement. No radiation down leg. Normal bowel/bladder habits. Progressive LE weakness or saddle anesthesia: none. Ambulatory without difficulty. Normal PO intake without n/v. Self treatment: has tried ibuprofen, TENS unit with some relief but not much.    OBJECTIVE:  Vitals:   04/23/20 1047  BP: (!) 141/88  Pulse: 95  Resp: 17  Temp: 98.2 F (36.8 C)  TempSrc: Oral  SpO2: 99%    General appearance: alert; no  distress HEENT: [place]; AT Neck: supple with FROM; without midline tenderness Lungs: unlabored respirations; speaks full sentences without difficulty Back: poorly localized tenderness to palpation over left lumbar musculature; FROM at waist; bruising: none; without midline tenderness Extremities: without edema Skin: warm and dry Neurologic: normal gait; normal sensation and strength of LLE Psychological: alert and cooperative; normal mood and affect  Labs: Results for orders placed or performed during the hospital encounter of 04/23/20  POCT urine pregnancy  Result Value Ref Range   Preg Test, Ur Negative Negative   Labs Reviewed  POCT URINE PREGNANCY   No Known Allergies  Past Medical History:  Diagnosis Date   Anxiety    Hyperlipidemia    Leg edema    Migraines    Vaginal Pap smear, abnormal 06/12/2019   LGSIL +HPV   Social History   Socioeconomic History   Marital status: Single    Spouse name: Not on file   Number of children: Not on file   Years of education: Not on file   Highest education level: Not on file  Occupational History   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: [employer]  Tobacco Use   Smoking status: Never Smoker   Smokeless tobacco: Never Used  Theme park manager Use: Never used  Substance and Sexual Activity   Alcohol use: Yes    Comment: occasional   Drug use: Never   Sexual activity: Yes    Birth control/protection: Pill  Other Topics Concern   Not on file  Social  History Narrative   Not on file   Social Determinants of Health   Financial Resource Strain: Not on file  Food Insecurity: Not on file  Transportation Needs: Not on file  Physical Activity: Not on file  Stress: Not on file  Social Connections: Not on file  Intimate Partner Violence: Not on file   Family History  Problem Relation Age of Onset   Anxiety disorder Mother    Diabetes Father    Obesity Father    History reviewed. No pertinent surgical  history.   Mardella Layman, MD 04/23/20 [phone]

## 2020-05-05 ENCOUNTER — Ambulatory Visit (INDEPENDENT_AMBULATORY_CARE_PROVIDER_SITE_OTHER): Payer: No Typology Code available for payment source | Admitting: Registered Nurse

## 2020-05-06 ENCOUNTER — Encounter (INDEPENDENT_AMBULATORY_CARE_PROVIDER_SITE_OTHER): Payer: Self-pay | Admitting: Physician Assistant

## 2020-05-06 ENCOUNTER — Telehealth (INDEPENDENT_AMBULATORY_CARE_PROVIDER_SITE_OTHER): Payer: No Typology Code available for payment source | Admitting: Physician Assistant

## 2020-05-06 DIAGNOSIS — E559 Vitamin D deficiency, unspecified: Secondary | ICD-10-CM

## 2020-05-06 DIAGNOSIS — E669 Obesity, unspecified: Secondary | ICD-10-CM

## 2020-05-07 NOTE — Progress Notes (Unsigned)
TeleHealth Visit:  Due to the COVID-19 pandemic, this visit was completed with telemedicine (audio/video) technology to reduce patient and provider exposure as well as to preserve personal protective equipment.   Alison Davies has verbally consented to this TeleHealth visit. The patient is located at home, the provider is located at the [facility] office. The participants in this visit include the listed provider and patient. The visit was conducted today via MyChart video.   Chief Complaint: OBESITY Alison Davies is here to discuss her progress with her obesity treatment plan along with follow-up of her obesity related diagnoses. Alison Davies is on the Category 3 Plan or keeping a food journal and adhering to recommended goals of 1400-1500 calories and 95 grams of protein daily and states she is following her eating plan approximately 50% of the time. Alison Davies states she is doing 0 minutes 0 times per week.  Today's visit was #: 47 Starting weight: 199 lbs Starting date: 07/26/2017  Interim History: Alison Davies reports that she did a lot of stress eating over the holidays, especially at work. She states that she is back on track and meal planned for the week last night. She starts [place] school next week.   Subjective:   1. Vitamin D deficiency Alison Davies is on Vit D twice weekly, and she denies nausea, vomiting, or muscle weakness.  Assessment/Plan:   1. Vitamin D deficiency Low Vitamin D level contributes to fatigue and are associated with obesity, breast, and colon cancer. We will refill prescription Vitamin D for 1 month. Alison Davies will follow-up for routine testing of Vitamin D, at least 2-3 times per year to avoid over-replacement.  2. Class 1 obesity with serious comorbidity and body mass index (BMI) of 31.0 to 31.9 in adult, unspecified obesity type Alison Davies is currently in the action stage of change. As such, her goal is to continue with weight loss efforts. She has agreed to the Category 3 Plan or keeping  a food journal and adhering to recommended goals of 1500 calories and 95 grams of protein daily.   Exercise goals: No exercise has been prescribed at this time.  Behavioral modification strategies: decreasing simple carbohydrates and meal planning and cooking strategies.  Alison Davies has agreed to follow-up with our clinic in 3 weeks. She was informed of the importance of frequent follow-up visits to maximize her success with intensive lifestyle modifications for her multiple health conditions.  Objective:   VITALS: Per patient if applicable, see vitals. GENERAL: Alert and in no acute distress. CARDIOPULMONARY: No increased WOB. Speaking in clear sentences.  PSYCH: Pleasant and cooperative. Speech normal rate and rhythm. Affect is appropriate. Insight and judgement are appropriate. Attention is focused, linear, and appropriate.  NEURO: Oriented as arrived to appointment on time with no prompting.   Lab Results  Component Value Date   CREATININE 0.66 03/24/2020   BUN 14 03/24/2020   NA 141 03/24/2020   K 4.6 03/24/2020   CL 108 (H) 03/24/2020   CO2 22 03/24/2020   Lab Results  Component Value Date   ALT 14 03/24/2020   AST 10 03/24/2020   ALKPHOS 79 03/24/2020   BILITOT 0.4 03/24/2020   Lab Results  Component Value Date   HGBA1C 4.9 03/24/2020   HGBA1C 4.8 10/23/2019   HGBA1C 4.8 06/13/2019   HGBA1C 5.0 11/13/2018   HGBA1C 4.9 04/19/2018   Lab Results  Component Value Date   INSULIN 18.6 03/24/2020   INSULIN 11.1 10/23/2019   INSULIN 15.4 06/13/2019   INSULIN  14.4 11/13/2018   INSULIN 21.8 04/19/2018   Lab Results  Component Value Date   TSH 1.850 07/26/2017   Lab Results  Component Value Date   CHOL 220 (H) 03/24/2020   HDL 43 03/24/2020   LDLCALC 159 (H) 03/24/2020   TRIG 99 03/24/2020   CHOLHDL 5.1 (H) 03/24/2020   Lab Results  Component Value Date   WBC 7.1 06/12/2019   HGB 14.0 06/12/2019   HCT 41.0 06/12/2019   MCV 89.7 06/12/2019   PLT 299.0  06/12/2019   No results found for: IRON, TIBC, FERRITIN  Attestation Statements:   Reviewed by clinician on day of visit: allergies, medications, problem list, medical history, surgical history, family history, social history, and previous encounter notes.   Trude Mcburney, am acting as transcriptionist for [employer], PA-C.  I have reviewed the above documentation for accuracy and completeness, and I agree with the above. - ***

## 2020-05-14 ENCOUNTER — Other Ambulatory Visit (INDEPENDENT_AMBULATORY_CARE_PROVIDER_SITE_OTHER): Payer: Self-pay | Admitting: Physician Assistant

## 2020-05-14 DIAGNOSIS — E669 Obesity, unspecified: Secondary | ICD-10-CM

## 2020-05-14 DIAGNOSIS — Z6831 Body mass index (BMI) 31.0-31.9, adult: Secondary | ICD-10-CM

## 2020-05-14 MED FILL — VIT D2 1.25 MG (50,000 UNIT: 1.25 MG | 28 days supply | Qty: 4 | Fill #0

## 2020-05-18 ENCOUNTER — Encounter (INDEPENDENT_AMBULATORY_CARE_PROVIDER_SITE_OTHER): Payer: Self-pay | Admitting: Family Medicine

## 2020-05-18 ENCOUNTER — Telehealth (INDEPENDENT_AMBULATORY_CARE_PROVIDER_SITE_OTHER): Payer: No Typology Code available for payment source | Admitting: Family Medicine

## 2020-05-18 ENCOUNTER — Other Ambulatory Visit (HOSPITAL_BASED_OUTPATIENT_CLINIC_OR_DEPARTMENT_OTHER): Payer: Self-pay | Admitting: Family Medicine

## 2020-05-18 ENCOUNTER — Other Ambulatory Visit: Payer: Self-pay

## 2020-05-18 ENCOUNTER — Encounter (INDEPENDENT_AMBULATORY_CARE_PROVIDER_SITE_OTHER): Payer: Self-pay | Admitting: Physician Assistant

## 2020-05-18 DIAGNOSIS — E669 Obesity, unspecified: Secondary | ICD-10-CM | POA: Diagnosis not present

## 2020-05-18 DIAGNOSIS — E559 Vitamin D deficiency, unspecified: Secondary | ICD-10-CM

## 2020-05-18 DIAGNOSIS — Z9189 Other specified personal risk factors, not elsewhere classified: Secondary | ICD-10-CM | POA: Diagnosis not present

## 2020-05-18 DIAGNOSIS — Z6831 Body mass index (BMI) 31.0-31.9, adult: Secondary | ICD-10-CM | POA: Diagnosis not present

## 2020-05-18 MED ORDER — WEGOVY 0.5 MG/0.5ML ~~LOC~~ SOAJ: 0.5000 mg | SUBCUTANEOUS | 0 refills | Status: DC

## 2020-05-18 MED ORDER — ERGOCALCIFEROL 1.25 MG (50000 UT) PO CAPS: 50000.0000 [IU] | ORAL_CAPSULE | ORAL | 0 refills | Status: DC

## 2020-05-18 MED FILL — VIT D2 1.25 MG (50,000 UNIT: 1.25 MG | 28 days supply | Qty: 8 | Fill #0

## 2020-05-18 MED FILL — WEGOVY 0.5 MG/0.5ML SOAJ: 0.5 | 28 days supply | Qty: 2 | Fill #0

## 2020-05-19 ENCOUNTER — Encounter (INDEPENDENT_AMBULATORY_CARE_PROVIDER_SITE_OTHER): Payer: Self-pay | Admitting: Family Medicine

## 2020-05-20 NOTE — Progress Notes (Signed)
TeleHealth Visit:  Due to the COVID-19 pandemic, this visit was completed with telemedicine (audio/video) technology to reduce patient and provider exposure as well as to preserve personal protective equipment.   Alison Davies has verbally consented to this TeleHealth visit. The patient is located at home, the provider is located at the [facility] office. The participants in this visit include the listed provider and patient. The visit was conducted today via MyChart video.   Chief Complaint: OBESITY Alison Davies is here to discuss her progress with her obesity treatment plan along with follow-up of her obesity related diagnoses. [name] is on the Category 3 Plan or keeping a food journal and adhering to recommended goals of 1400-1500 calories and 95 grams of protein daily and states she is following her eating plan approximately 0% of the time. Charity states she is doing 0 minutes 0 times per week.  Today's visit was #: 48 Starting weight: 199 lbs Starting date: 07/26/2017  Interim History: [name] has done well maintaining her weight even over the holidays. She is doing well on Wegovy and she doesn't mention any side effects. Her hunger is mostly controlled.  Subjective:   1. Vitamin D deficiency Alison Davies is on Vit D prescription and she had been forgetting to take it regularly, but she has been doing much better taking it recently.  2. At risk for impaired metabolic function Alison Davies is at increased risk for impaired metabolic function due to decrease in protein intake over the holidays.  Assessment/Plan:   1. Vitamin D deficiency Low Vitamin D level contributes to fatigue and are associated with obesity, breast, and colon cancer. We will refill prescription Vitamin D for 1 month, and we will recheck labs in 1 month. Amry will follow-up for routine testing of Vitamin D, at least 2-3 times per year to avoid over-replacement.  - ergocalciferol (VITAMIN D2) 1.25 MG (50000 UT) capsule; Take 1  capsule (50,000 Units total) by mouth 2 (two) times a week.  Dispense: 10 capsule; Refill: 0  2. At risk for impaired metabolic function Alison Davies was given approximately 15 minutes of impaired  metabolic function prevention counseling today. We discussed intensive lifestyle modifications today with an emphasis on specific nutrition and exercise instructions and strategies.   Repetitive spaced learning was employed today to elicit superior memory formation and behavioral change.  3. Class 1 obesity with serious comorbidity and body mass index (BMI) of 31.0 to 31.9 in adult, unspecified obesity type Alison Davies is currently in the action stage of change. As such, her goal is to continue with weight loss efforts. She has agreed to keeping a food journal and adhering to recommended goals of 1500 calories and 90+ grams of protein daily.   We discussed various medication options to help Alison Davies with her weight loss efforts and we both agreed to continue [place], and we will refill for 1 month.  - Semaglutide-Weight Management (WEGOVY) 0.5 MG/0.5ML SOAJ; Inject 0.5 mg into the skin once a week.  Dispense: 2 mL; Refill: 0  Behavioral modification strategies: meal planning and cooking strategies and emotional eating strategies.  Alison Davies has agreed to follow-up with our clinic in 2 to 3 weeks. She was informed of the importance of frequent follow-up visits to maximize her success with intensive lifestyle modifications for her multiple health conditions.  Objective:   VITALS: Per patient if applicable, see vitals. GENERAL: Alert and in no acute distress. CARDIOPULMONARY: No increased WOB. Speaking in clear sentences.  PSYCH: Pleasant and cooperative. Speech normal  rate and rhythm. Affect is appropriate. Insight and judgement are appropriate. Attention is focused, linear, and appropriate.  NEURO: Oriented as arrived to appointment on time with no prompting.   Lab Results  Component Value Date   CREATININE 0.66  03/24/2020   BUN 14 03/24/2020   NA 141 03/24/2020   K 4.6 03/24/2020   CL 108 (H) 03/24/2020   CO2 22 03/24/2020   Lab Results  Component Value Date   ALT 14 03/24/2020   AST 10 03/24/2020   ALKPHOS 79 03/24/2020   BILITOT 0.4 03/24/2020   Lab Results  Component Value Date   HGBA1C 4.9 03/24/2020   HGBA1C 4.8 10/23/2019   HGBA1C 4.8 06/13/2019   HGBA1C 5.0 11/13/2018   HGBA1C 4.9 04/19/2018   Lab Results  Component Value Date   INSULIN 18.6 03/24/2020   INSULIN 11.1 10/23/2019   INSULIN 15.4 06/13/2019   INSULIN 14.4 11/13/2018   INSULIN 21.8 04/19/2018   Lab Results  Component Value Date   TSH 1.850 07/26/2017   Lab Results  Component Value Date   CHOL 220 (H) 03/24/2020   HDL 43 03/24/2020   LDLCALC 159 (H) 03/24/2020   TRIG 99 03/24/2020   CHOLHDL 5.1 (H) 03/24/2020   Lab Results  Component Value Date   WBC 7.1 06/12/2019   HGB 14.0 06/12/2019   HCT 41.0 06/12/2019   MCV 89.7 06/12/2019   PLT 299.0 06/12/2019   No results found for: IRON, TIBC, FERRITIN  Attestation Statements:   Reviewed by clinician on day of visit: allergies, medications, problem list, medical history, surgical history, family history, social history, and previous encounter notes.   I, Burt Knack, am acting as transcriptionist for Quillian Quince, MD.  I have reviewed the above documentation for accuracy and completeness, and I agree with the above. - Quillian Quince, MD

## 2020-06-04 ENCOUNTER — Other Ambulatory Visit (INDEPENDENT_AMBULATORY_CARE_PROVIDER_SITE_OTHER): Payer: Self-pay | Admitting: Physician Assistant

## 2020-06-04 ENCOUNTER — Other Ambulatory Visit: Payer: Self-pay

## 2020-06-04 ENCOUNTER — Ambulatory Visit (INDEPENDENT_AMBULATORY_CARE_PROVIDER_SITE_OTHER): Payer: No Typology Code available for payment source | Admitting: Physician Assistant

## 2020-06-04 ENCOUNTER — Encounter (INDEPENDENT_AMBULATORY_CARE_PROVIDER_SITE_OTHER): Payer: Self-pay | Admitting: Physician Assistant

## 2020-06-04 VITALS — BP 119/81 | HR 118 | Temp 98.0°F | Ht 64.0 in | Wt 179.0 lb

## 2020-06-04 DIAGNOSIS — Z9189 Other specified personal risk factors, not elsewhere classified: Secondary | ICD-10-CM

## 2020-06-04 DIAGNOSIS — E669 Obesity, unspecified: Secondary | ICD-10-CM | POA: Diagnosis not present

## 2020-06-04 DIAGNOSIS — E559 Vitamin D deficiency, unspecified: Secondary | ICD-10-CM

## 2020-06-04 DIAGNOSIS — E8881 Metabolic syndrome: Secondary | ICD-10-CM | POA: Diagnosis not present

## 2020-06-04 DIAGNOSIS — Z6830 Body mass index (BMI) 30.0-30.9, adult: Secondary | ICD-10-CM | POA: Diagnosis not present

## 2020-06-04 MED ORDER — WEGOVY 1 MG/0.5ML ~~LOC~~ SOAJ: 1.0000 mg | SUBCUTANEOUS | 0 refills | Status: DC

## 2020-06-04 MED FILL — WEGOVY 1 MG/0.5ML SOAJ: 1 | 28 days supply | Qty: 2 | Fill #0

## 2020-06-08 NOTE — Progress Notes (Signed)
Chief Complaint:   OBESITY Alison Davies is here to discuss her progress with her obesity treatment plan along with follow-up of her obesity related diagnoses. Alison Davies is on keeping a food journal and adhering to recommended goals of 1500 calories and 90+ grams of protein daily and states she is following her eating plan approximately 70% of the time. Alison Davies states she is on the treadmill and doing weights for 30 minutes 1-2 times per week.  Today's visit was #: 49 Starting weight: 199 lbs Starting date: 07/26/2017 Today's weight: 179 lbs Today's date: 06/04/2020 Total lbs lost to date: 20 Total lbs lost since last in-office visit: 2  Interim History: Alison Davies changed jobs ans she is now working days only. She is also going to [employer]. She is on 0.5 mg of Wegovy and she feels that it decreases her hunger. She is only gets nauseous if she overeats. She journals well when she is on the plan, but does not journal when she is off the plan.  Subjective:   1. Vitamin D deficiency Alison Davies is on Vit D twice weekly, and she denies nausea, vomiting, or muscle weakness.  2. Insulin resistance Alison Davies is not on medications, and she denies polyphagia.  3. At risk for hypoglycemia Alison Davies is at increased risk for hypoglycemia due to changes in diet, diagnosis of diabetes, and/or insulin use.   Assessment/Plan:   1. Vitamin D deficiency Low Vitamin D level contributes to fatigue and are associated with obesity, breast, and colon cancer. Alison Davies agreed to continue taking prescription Vitamin D 50,000 IU twice weekly and will follow-up for routine testing of Vitamin D, at least 2-3 times per year to avoid over-replacement.  2. Insulin resistance Alison Davies will continue her meal plan, and will continue to work on weight loss, exercise, and decreasing simple carbohydrates to help decrease the risk of diabetes. Alison Davies agreed to follow-up with [name] as directed to closely monitor her progress.  3. At risk for  hypoglycemia Alison Davies was given approximately 15 minutes of counseling today regarding prevention of hypoglycemia. She was advised of symptoms of hypoglycemia. Alison Davies was instructed to avoid skipping meals, eat regular protein rich meals and schedule low calorie snacks as needed.   Repetitive spaced learning was employed today to elicit superior memory formation and behavioral change  4. Class 1 obesity with serious comorbidity and body mass index (BMI) of 30.0 to 30.9 in adult, unspecified obesity type Alison Davies is currently in the action stage of change. As such, her goal is to continue with weight loss efforts. She has agreed to keeping a food journal and adhering to recommended goals of 1400 calories and 100 grams of protein daily.   We discussed various medication options to help Alison Davies with her weight loss efforts and we both agreed to increase Wegovy to 1.0 mg #4 with no refills.  Exercise goals: As is.  Behavioral modification strategies: decreasing simple carbohydrates and keeping a strict food journal.  Alison Davies has agreed to follow-up with our clinic in 3 weeks. She was informed of the importance of frequent follow-up visits to maximize her success with intensive lifestyle modifications for her multiple health conditions.   Objective:   Blood pressure 119/81, pulse (!) 118, temperature 98 F (36.7 C), temperature source Oral, height 5\' 4"  (1.626 m), weight 179 lb (81.2 kg), last menstrual period 04/24/2020, SpO2 92 %. Body mass index is 30.73 kg/m.  General: Cooperative, alert, well developed, in no acute distress. HEENT: Conjunctivae and lids unremarkable. Cardiovascular: Regular  rhythm.  Lungs: Normal work of breathing. Neurologic: No focal deficits.   Lab Results  Component Value Date   CREATININE 0.66 03/24/2020   BUN 14 03/24/2020   NA 141 03/24/2020   K 4.6 03/24/2020   CL 108 (H) 03/24/2020   CO2 22 03/24/2020   Lab Results  Component Value Date   ALT 14 03/24/2020    AST 10 03/24/2020   ALKPHOS 79 03/24/2020   BILITOT 0.4 03/24/2020   Lab Results  Component Value Date   HGBA1C 4.9 03/24/2020   HGBA1C 4.8 10/23/2019   HGBA1C 4.8 06/13/2019   HGBA1C 5.0 11/13/2018   HGBA1C 4.9 04/19/2018   Lab Results  Component Value Date   INSULIN 18.6 03/24/2020   INSULIN 11.1 10/23/2019   INSULIN 15.4 06/13/2019   INSULIN 14.4 11/13/2018   INSULIN 21.8 04/19/2018   Lab Results  Component Value Date   TSH 1.850 07/26/2017   Lab Results  Component Value Date   CHOL 220 (H) 03/24/2020   HDL 43 03/24/2020   LDLCALC 159 (H) 03/24/2020   TRIG 99 03/24/2020   CHOLHDL 5.1 (H) 03/24/2020   Lab Results  Component Value Date   WBC 7.1 06/12/2019   HGB 14.0 06/12/2019   HCT 41.0 06/12/2019   MCV 89.7 06/12/2019   PLT 299.0 06/12/2019   No results found for: IRON, TIBC, FERRITIN  Attestation Statements:   Reviewed by clinician on day of visit: allergies, medications, problem list, medical history, surgical history, family history, social history, and previous encounter notes.   Trude Mcburney, am acting as transcriptionist for [employer], PA-C.  I have reviewed the above documentation for accuracy and completeness, and I agree with the above. [name], PA-C

## 2020-06-24 ENCOUNTER — Ambulatory Visit (INDEPENDENT_AMBULATORY_CARE_PROVIDER_SITE_OTHER): Payer: No Typology Code available for payment source | Admitting: Family Medicine

## 2020-06-24 ENCOUNTER — Encounter: Payer: Self-pay | Admitting: Family Medicine

## 2020-06-24 ENCOUNTER — Other Ambulatory Visit: Payer: Self-pay

## 2020-06-24 VITALS — BP 116/78 | HR 92 | Temp 97.6°F | Ht 64.0 in | Wt 181.0 lb

## 2020-06-24 DIAGNOSIS — Z111 Encounter for screening for respiratory tuberculosis: Secondary | ICD-10-CM

## 2020-06-24 DIAGNOSIS — Z0000 Encounter for general adult medical examination without abnormal findings: Secondary | ICD-10-CM | POA: Diagnosis not present

## 2020-06-24 DIAGNOSIS — G43709 Chronic migraine without aura, not intractable, without status migrainosus: Secondary | ICD-10-CM

## 2020-06-24 DIAGNOSIS — F411 Generalized anxiety disorder: Secondary | ICD-10-CM

## 2020-06-24 DIAGNOSIS — E559 Vitamin D deficiency, unspecified: Secondary | ICD-10-CM | POA: Diagnosis not present

## 2020-06-24 DIAGNOSIS — E785 Hyperlipidemia, unspecified: Secondary | ICD-10-CM | POA: Diagnosis not present

## 2020-06-24 NOTE — Progress Notes (Signed)
Alison Davies is a 35 y.o. female  Chief Complaint  Patient presents with  . Annual Exam    CPE/labs.  Needs school physical form filled out.  Wants to discuss POCT.      HPI: Alison Davies is a 35 y.o. female seen today for annual CPE. 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 She is happy about this change.  She has labs 3 mo ago w/ [facility], including CMP, A1C, Vit D, lipid panel. She needs school physical form completed.   Last PAP: 06/2019 - LGSIL and HR HPV +, had colposcopy done with Dr. Erin Fulling and due in 07/2020 for f/u She was dx with PCOS by [facility] in college but states no testing was done. Dx was made based on irregular periods, facial hair.   Diet/Exercise: she is on wegovy, diet overall healthy; no regular exercise Dental: due  Vision: wears contacts - due for exam  Med refills needed today? none   Past Medical History:  Diagnosis Date  . Anxiety   . Hyperlipidemia   . Leg edema   . Migraines   . Vaginal Pap smear, abnormal 06/12/2019   LGSIL +HPV    History reviewed. No pertinent surgical history.  Social History   Socioeconomic History  . Marital status: Single    Spouse name: Not on file  . Number of children: Not on file  . Years of education: Not on file  . Highest education level: Not on file  Occupational History  . Occupation: [employer] manager: [employer]  Tobacco Use  . Smoking status: Never Smoker  . Smokeless tobacco: Never Used  Vaping Use  . Vaping Use: Never used  Substance and Sexual Activity  . Alcohol use: Yes    Comment: occasional  . Drug use: Never  . Sexual activity: Yes    Birth control/protection: Pill  Other Topics Concern  . Not on file  Social History Narrative  . Not on file   Social Determinants of Health   Financial Resource Strain: Not on file  Food Insecurity: Not on file  Transportation Needs: Not on file  Physical Activity:  Not on file  Stress: Not on file  Social Connections: Not on file  Intimate Partner Violence: Not on file    Family History  Problem Relation Age of Onset  . Anxiety disorder Mother   . Diabetes Father   . Obesity Father      Immunization History  Administered Date(s) Administered  . Influenza,inj,Quad PF,6+ Mos 01/16/2020  . Influenza-Unspecified 01/17/2014  . PFIZER(Purple Top)SARS-COV-2 Vaccination 05/30/2019, 06/18/2019, 02/18/2020  . PPD Test 02/24/2014, 03/06/2014, 03/27/2015, 03/28/2016  . Tdap 02/17/2014, 03/04/2018    Outpatient Encounter Medications as of 06/24/2020  Medication Sig  . ALPRAZolam (XANAX) 0.25 MG tablet Take 1 tablet (0.25 mg total) by mouth at bedtime as needed for anxiety.  . Biotin 3 MG TABS Take 2 tablets by mouth daily.  . Cyanocobalamin (VITAMIN B-12 CR) 1500 MCG TBCR Take 2 tablets by mouth 2 (two) times daily.  . ergocalciferol (VITAMIN D2) 1.25 MG (50000 UT) capsule Take 1 capsule (50,000 Units total) by mouth 2 (two) times a week.  . Melatonin 5 MG CHEW Chew 2 capsules by mouth at bedtime as needed.  . Multiple Vitamin (MULTIVITAMIN WITH MINERALS) TABS tablet Take 1 tablet by mouth daily.  . norethindrone (MICRONOR) 0.35 MG tablet TAKE 1 TABLET (0.35  MG TOTAL) BY MOUTH DAILY.  Marland Kitchen Semaglutide-Weight Management (WEGOVY) 1 MG/0.5ML SOAJ Inject 1 mg into the skin once a week.  . sertraline (ZOLOFT) 100 MG tablet Take 1 tablet (100 mg total) by mouth daily.  . SUMAtriptan (IMITREX) 25 MG tablet Take 1 tablet (25 mg total) by mouth every 2 (two) hours as needed for migraine. May repeat in 2 hours if headache persists or recurs.  . topiramate (TOPAMAX) 100 MG tablet Take 1 tablet (100 mg total) by mouth at bedtime.  . Turmeric (QC TUMERIC COMPLEX PO) Take by mouth.  . vitamin C (ASCORBIC ACID) 250 MG tablet Take 250 mg by mouth daily.   No facility-administered encounter medications on file as of 06/24/2020.     ROS: Gen: no fever, chills  Skin: no  rash, itching ENT: no ear pain, ear drainage, nasal congestion, rhinorrhea, sinus pressure, sore throat Eyes: no blurry vision, double vision Resp: no cough, wheeze,SOB CV: no CP, palpitations, LE edema,  GI: no heartburn, n/v/d/c, abd pain GU: no dysuria, urgency, frequency, hematuria MSK: no joint pain, myalgias, back pain Neuro: + migraine headaches Psych: + anxiety, depression   No Known Allergies  BP 116/78   Pulse 92   Temp 97.6 F (36.4 C) (Temporal)   Ht 5\' 4"  (1.626 m)   Wt 181 lb (82.1 kg)   SpO2 98%   BMI 31.07 kg/m   Wt Readings from Last 3 Encounters:  06/24/20 181 lb (82.1 kg)  06/04/20 179 lb (81.2 kg)  04/15/20 181 lb (82.1 kg)   Temp Readings from Last 3 Encounters:  06/24/20 97.6 F (36.4 C) (Temporal)  06/04/20 98 F (36.7 C) (Oral)  04/23/20 98.2 F (36.8 C) (Oral)   BP Readings from Last 3 Encounters:  06/24/20 116/78  06/04/20 119/81  04/23/20 (!) 141/88   Pulse Readings from Last 3 Encounters:  06/24/20 92  06/04/20 (!) 118  04/23/20 95     Physical Exam Constitutional:      General: She is not in acute distress.    Appearance: She is well-developed and well-nourished.  HENT:     Head: Normocephalic and atraumatic.     Right Ear: Tympanic membrane and ear canal normal.     Left Ear: Tympanic membrane and ear canal normal.     Nose: Nose normal.     Mouth/Throat:     Mouth: Oropharynx is clear and moist and mucous membranes are normal. Mucous membranes are moist.     Pharynx: Oropharynx is clear.  Eyes:     Conjunctiva/sclera: Conjunctivae normal.  Neck:     Thyroid: No thyromegaly.  Cardiovascular:     Rate and Rhythm: Normal rate and regular rhythm.     Pulses: Intact distal pulses.     Heart sounds: Normal heart sounds. No murmur heard.   Pulmonary:     Effort: Pulmonary effort is normal. No respiratory distress.     Breath sounds: Normal breath sounds. No wheezing or rhonchi.  Abdominal:     General: Bowel sounds are  normal. There is no distension.     Palpations: Abdomen is soft. There is no mass.     Tenderness: There is no abdominal tenderness.  Musculoskeletal:        General: No edema.     Cervical back: Neck supple.     Right lower leg: No edema.     Left lower leg: No edema.  Lymphadenopathy:     Cervical: No cervical adenopathy.  Skin:  General: Skin is warm and dry.  Neurological:     General: No focal deficit present.     Mental Status: She is alert and oriented to person, place, and time.     Motor: No abnormal muscle tone.     Coordination: Coordination normal.  Psychiatric:        Mood and Affect: Mood and affect and mood normal.        Behavior: Behavior normal.     A/P:  1. Annual physical exam - discussed importance of regular CV exercise, healthy diet, adequate sleep - immunizations UTD - due for dental and vision - labs in 03/2020 so none today - due for PAP in 07/2020 - next CPE in 1 year 2. Vitamin D deficiency - takes 50,000IU 2x/wk  3. Hyperlipidemia, unspecified hyperlipidemia type - not on med - needs to incorporate regular CV exercise and continue to work on diet  4. Chronic migraine without aura without status migrainosus, not intractable - stable, controlled - cont topamax and PRN imitrex  5. GAD (generalized anxiety disorder) - cont zoloft 100mg  daily    This visit occurred during the SARS-CoV-2 public health emergency.  Safety protocols were in place, including screening questions prior to the visit, additional usage of staff PPE, and extensive cleaning of exam room while observing appropriate contact time as indicated for disinfecting solutions.

## 2020-06-24 NOTE — Addendum Note (Signed)
Addended by: Overton Mam on: 06/24/2020 09:42 AM   Modules accepted: Orders

## 2020-06-25 ENCOUNTER — Other Ambulatory Visit (HOSPITAL_BASED_OUTPATIENT_CLINIC_OR_DEPARTMENT_OTHER): Payer: Self-pay | Admitting: Physician Assistant

## 2020-06-25 ENCOUNTER — Other Ambulatory Visit: Payer: Self-pay

## 2020-06-25 ENCOUNTER — Ambulatory Visit (INDEPENDENT_AMBULATORY_CARE_PROVIDER_SITE_OTHER): Payer: No Typology Code available for payment source | Admitting: Physician Assistant

## 2020-06-25 ENCOUNTER — Encounter (INDEPENDENT_AMBULATORY_CARE_PROVIDER_SITE_OTHER): Payer: Self-pay | Admitting: Physician Assistant

## 2020-06-25 ENCOUNTER — Telehealth: Payer: Self-pay | Admitting: General Practice

## 2020-06-25 VITALS — BP 112/75 | HR 94 | Temp 97.8°F | Ht 64.0 in | Wt 177.0 lb

## 2020-06-25 DIAGNOSIS — E785 Hyperlipidemia, unspecified: Secondary | ICD-10-CM | POA: Diagnosis not present

## 2020-06-25 DIAGNOSIS — E559 Vitamin D deficiency, unspecified: Secondary | ICD-10-CM

## 2020-06-25 DIAGNOSIS — E669 Obesity, unspecified: Secondary | ICD-10-CM

## 2020-06-25 DIAGNOSIS — Z6830 Body mass index (BMI) 30.0-30.9, adult: Secondary | ICD-10-CM | POA: Diagnosis not present

## 2020-06-25 DIAGNOSIS — Z9189 Other specified personal risk factors, not elsewhere classified: Secondary | ICD-10-CM | POA: Diagnosis not present

## 2020-06-25 MED ORDER — WEGOVY 1 MG/0.5ML ~~LOC~~ SOAJ: 1.0000 mg | SUBCUTANEOUS | 0 refills | Status: DC

## 2020-06-25 MED ORDER — ERGOCALCIFEROL 1.25 MG (50000 UT) PO CAPS: 50000.0000 [IU] | ORAL_CAPSULE | ORAL | 0 refills | Status: DC

## 2020-06-25 MED FILL — VIT D2 1.25 MG (50,000 UNIT: 1.25 MG | 35 days supply | Qty: 10 | Fill #0

## 2020-06-25 NOTE — Telephone Encounter (Signed)
Left message on VM for patient to give our office a call to schedule Annual Exam.  Pt is due in April 2022.

## 2020-06-26 LAB — QUANTIFERON-TB GOLD PLUS
Mitogen-NIL: >10 IU/mL
NIL: 0.05 IU/mL
QuantiFERON-TB Gold Plus: NEGATIVE
TB1-NIL: 0 IU/mL
TB2-NIL: 0.01 IU/mL

## 2020-06-26 MED FILL — WEGOVY 1 MG/0.5ML SOAJ: 1 | 28 days supply | Qty: 2 | Fill #0

## 2020-06-29 NOTE — Progress Notes (Signed)
Chief Complaint:   OBESITY Alison Davies is here to discuss her progress with her obesity treatment plan along with follow-up of her obesity related diagnoses. Oral is on keeping a food journal and adhering to recommended goals of 1400 calories and 100 grams of protein and states she is following her eating plan approximately 80% of the time. Alison Davies states she is doing 0 minutes 0 times per week.  Today's visit was #: 50 Starting weight: 199 lbs Starting date: 07/26/2017 Today's weight: 177 lbs Today's date: 06/25/2020 Total lbs lost to date: 22 Total lbs lost since last in-office visit: 2  Interim History: Aletha is on Wegovy 1 mg and she is tolerating it well most of the time. Her appetite has decreased but she is eating all of the food on the plan most days.  Subjective:   1. Vitamin D deficiency Alison Davies is on Vit D twice weekly. Last Vit D level was not at goal.  2. Hyperlipidemia, unspecified hyperlipidemia type Alison Davies's last lipid panel was not at goal. She is followed by her primary care physician. She is not on medications.  3. At risk for heart disease Alison Davies is at a higher than average risk for cardiovascular disease due to obesity.   Assessment/Plan:   1. Vitamin D deficiency Low Vitamin D level contributes to fatigue and are associated with obesity, breast, and colon cancer. We will refill prescription Vitamin D for 1 month. [place] will follow-up for routine testing of Vitamin D, at least 2-3 times per year to avoid over-replacement.  - ergocalciferol (VITAMIN D2) 1.25 MG (50000 UT) capsule; Take 1 capsule (50,000 Units total) by mouth 2 (two) times a week.  Dispense: 10 capsule; Refill: 0  2. Hyperlipidemia, unspecified hyperlipidemia type Cardiovascular risk and specific lipid/LDL goals reviewed. We discussed several lifestyle modifications today. Alison Davies will continue with her meal plan, and will continue to work on exercise and weight loss efforts. Orders and follow up as  documented in patient record.   Counseling Intensive lifestyle modifications are the first line treatment for this issue. . Dietary changes: Increase soluble fiber. Decrease simple carbohydrates. . Exercise changes: Moderate to vigorous-intensity aerobic activity 150 minutes per week if tolerated. . Lipid-lowering medications: see documented in medical record.  3. At risk for heart disease Alison Davies was given approximately 15 minutes of coronary artery disease prevention counseling today. She is 35 y.o. female and has risk factors for heart disease including obesity. We discussed intensive lifestyle modifications today with an emphasis on specific weight loss instructions and strategies.   Repetitive spaced learning was employed today to elicit superior memory formation and behavioral change.  4. Class 1 obesity with serious comorbidity and body mass index (BMI) of 30.0 to 30.9 in adult, unspecified obesity type Alison Davies is currently in the action stage of change. As such, her goal is to continue with weight loss efforts. She has agreed to keeping a food journal and adhering to recommended goals of 1400 calories and 100 grams of protein daily.   We discussed various medication options to help Alison Davies with her weight loss efforts and we both agreed to continue [facility], and we will refill for 1 month.  - Semaglutide-Weight Management (WEGOVY) 1 MG/0.5ML SOAJ; Inject 1 mg into the skin once a week.  Dispense: 2 mL; Refill: 0  Exercise goals: No exercise has been prescribed at this time.  Behavioral modification strategies: meal planning and cooking strategies and keeping healthy foods in the home.  Alison Davies has agreed to  follow-up with our clinic in 3 weeks. She was informed of the importance of frequent follow-up visits to maximize her success with intensive lifestyle modifications for her multiple health conditions.   Objective:   Blood pressure 112/75, pulse 94, temperature 97.8 F (36.6 C), height  5\' 4"  (1.626 m), weight 177 lb (80.3 kg), SpO2 98 %. Body mass index is 30.38 kg/m.  General: Cooperative, alert, well developed, in no acute distress. HEENT: Conjunctivae and lids unremarkable. Cardiovascular: Regular rhythm.  Lungs: Normal work of breathing. Neurologic: No focal deficits.   Lab Results  Component Value Date   CREATININE 0.66 03/24/2020   BUN 14 03/24/2020   NA 141 03/24/2020   K 4.6 03/24/2020   CL 108 (H) 03/24/2020   CO2 22 03/24/2020   Lab Results  Component Value Date   ALT 14 03/24/2020   AST 10 03/24/2020   ALKPHOS 79 03/24/2020   BILITOT 0.4 03/24/2020   Lab Results  Component Value Date   HGBA1C 4.9 03/24/2020   HGBA1C 4.8 10/23/2019   HGBA1C 4.8 06/13/2019   HGBA1C 5.0 11/13/2018   HGBA1C 4.9 04/19/2018   Lab Results  Component Value Date   INSULIN 18.6 03/24/2020   INSULIN 11.1 10/23/2019   INSULIN 15.4 06/13/2019   INSULIN 14.4 11/13/2018   INSULIN 21.8 04/19/2018   Lab Results  Component Value Date   TSH 1.850 07/26/2017   Lab Results  Component Value Date   CHOL 220 (H) 03/24/2020   HDL 43 03/24/2020   LDLCALC 159 (H) 03/24/2020   TRIG 99 03/24/2020   CHOLHDL 5.1 (H) 03/24/2020   Lab Results  Component Value Date   WBC 7.1 06/12/2019   HGB 14.0 06/12/2019   HCT 41.0 06/12/2019   MCV 89.7 06/12/2019   PLT 299.0 06/12/2019   No results found for: IRON, TIBC, FERRITIN  Attestation Statements:   Reviewed by clinician on day of visit: allergies, medications, problem list, medical history, surgical history, family history, social history, and previous encounter notes.   08/10/2019, am acting as transcriptionist for Trude Mcburney, PA-C.  I have reviewed the above documentation for accuracy and completeness, and I agree with the above. [employer], PA-C

## 2020-07-15 ENCOUNTER — Other Ambulatory Visit: Payer: Self-pay

## 2020-07-15 ENCOUNTER — Ambulatory Visit (INDEPENDENT_AMBULATORY_CARE_PROVIDER_SITE_OTHER): Payer: No Typology Code available for payment source | Admitting: Physician Assistant

## 2020-07-15 ENCOUNTER — Other Ambulatory Visit (INDEPENDENT_AMBULATORY_CARE_PROVIDER_SITE_OTHER): Payer: Self-pay | Admitting: Physician Assistant

## 2020-07-15 ENCOUNTER — Encounter (INDEPENDENT_AMBULATORY_CARE_PROVIDER_SITE_OTHER): Payer: Self-pay | Admitting: Physician Assistant

## 2020-07-15 VITALS — BP 105/70 | HR 100 | Temp 97.8°F | Ht 64.0 in | Wt 175.0 lb

## 2020-07-15 DIAGNOSIS — E8881 Metabolic syndrome: Secondary | ICD-10-CM

## 2020-07-15 DIAGNOSIS — E559 Vitamin D deficiency, unspecified: Secondary | ICD-10-CM | POA: Diagnosis not present

## 2020-07-15 DIAGNOSIS — Z6830 Body mass index (BMI) 30.0-30.9, adult: Secondary | ICD-10-CM

## 2020-07-15 DIAGNOSIS — Z9189 Other specified personal risk factors, not elsewhere classified: Secondary | ICD-10-CM | POA: Diagnosis not present

## 2020-07-15 DIAGNOSIS — E669 Obesity, unspecified: Secondary | ICD-10-CM | POA: Diagnosis not present

## 2020-07-15 MED ORDER — WEGOVY 1.7 MG/0.75ML ~~LOC~~ SOAJ
1.7000 mg | SUBCUTANEOUS | 0 refills | Status: DC
Start: 2020-07-15 — End: 2020-07-15

## 2020-07-20 NOTE — Progress Notes (Signed)
Chief Complaint:   OBESITY Alison Davies is here to discuss her progress with her obesity treatment plan along with follow-up of her obesity related diagnoses. Alison Davies is on keeping a food journal and adhering to recommended goals of 1400 calories and 100 grams of protein daily and states she is following her eating plan approximately 80% of the time. Alison Davies states she walked 1 day for 30 minutes.  Today's visit was #: 51 Starting weight: 199 lbs Starting date: 07/26/2017 Today's weight: 175 lbs Today's date: 07/15/2020 Total lbs lost to date: 24 Total lbs lost since last in-office visit: 2  Interim History: Keiley did well with weight loss. She has been very stressed with work and [employer]. She is on Wegovy 1 mg and she notes that she continues to stress eat.  Subjective:   1. Insulin resistance Alison Davies is not on medications, and she denies polyphagia.  2. Vitamin D deficiency Alison Davies is on Vit D twice weekly, and she is tolerating it well. She is due for labs.  3. At risk for diabetes mellitus Alison Davies is at higher than average risk for developing diabetes due to obesity.   Assessment/Plan:   1. Insulin resistance Alison Davies will continue her meal plan, and will continue to work on weight loss, exercise, and decreasing simple carbohydrates to help decrease the risk of diabetes. Alison Davies agreed to follow-up with Alison Davies as directed to closely monitor her progress.  2. Vitamin D deficiency Low Vitamin D level contributes to fatigue and are associated with obesity, breast, and colon cancer. Alison Davies agreed to continue taking prescription Vitamin D 50,000 IU twice weekly and will follow-up for routine testing of Vitamin D, at least 2-3 times per year to avoid over-replacement.  3. At risk for diabetes mellitus Alison Davies was given approximately 15 minutes of diabetes education and counseling today. We discussed intensive lifestyle modifications today with an emphasis on weight loss as well as increasing exercise and  decreasing simple carbohydrates in her diet. We also reviewed medication options with an emphasis on risk versus benefit of those discussed.   Repetitive spaced learning was employed today to elicit superior memory formation and behavioral change.  4. Class 1 obesity with serious comorbidity and body mass index (BMI) of 30.0 to 30.9 in adult, unspecified obesity type Alison Davies is currently in the action stage of change. As such, her goal is to continue with weight loss efforts. She has agreed to keeping a food journal and adhering to recommended goals of 1400-1500 calories and 95 grams of protein daily.   We discussed various medication options to help Alison Davies with her weight loss efforts and we both agreed to continue [facility], and she agreed to increase to 1.7 mg weekly with no refills.  - Semaglutide-Weight Management (WEGOVY) 1.7 MG/0.75ML SOAJ; Inject 1.7 mg into the skin once a week.  Dispense: 3 mL; Refill: 0  Exercise goals: No exercise has been prescribed at this time.  Behavioral modification strategies: meal planning and cooking strategies, planning for success and keeping a strict food journal.  Alison Davies has agreed to follow-up with our clinic in 3 weeks. She was informed of the importance of frequent follow-up visits to maximize her success with intensive lifestyle modifications for her multiple health conditions.   Objective:   Blood pressure 105/70, pulse 100, temperature 97.8 F (36.6 C), height 5\' 4"  (1.626 m), weight 175 lb (79.4 kg), SpO2 97 %. Body mass index is 30.04 kg/m.  General: Cooperative, alert, well developed, in no acute distress.  HEENT: Conjunctivae and lids unremarkable. Cardiovascular: Regular rhythm.  Lungs: Normal work of breathing. Neurologic: No focal deficits.   Lab Results  Component Value Date   CREATININE 0.66 03/24/2020   BUN 14 03/24/2020   NA 141 03/24/2020   K 4.6 03/24/2020   CL 108 (H) 03/24/2020   CO2 22 03/24/2020   Lab Results  Component  Value Date   ALT 14 03/24/2020   AST 10 03/24/2020   ALKPHOS 79 03/24/2020   BILITOT 0.4 03/24/2020   Lab Results  Component Value Date   HGBA1C 4.9 03/24/2020   HGBA1C 4.8 10/23/2019   HGBA1C 4.8 06/13/2019   HGBA1C 5.0 11/13/2018   HGBA1C 4.9 04/19/2018   Lab Results  Component Value Date   INSULIN 18.6 03/24/2020   INSULIN 11.1 10/23/2019   INSULIN 15.4 06/13/2019   INSULIN 14.4 11/13/2018   INSULIN 21.8 04/19/2018   Lab Results  Component Value Date   TSH 1.850 07/26/2017   Lab Results  Component Value Date   CHOL 220 (H) 03/24/2020   HDL 43 03/24/2020   LDLCALC 159 (H) 03/24/2020   TRIG 99 03/24/2020   CHOLHDL 5.1 (H) 03/24/2020   Lab Results  Component Value Date   WBC 7.1 06/12/2019   HGB 14.0 06/12/2019   HCT 41.0 06/12/2019   MCV 89.7 06/12/2019   PLT 299.0 06/12/2019   No results found for: IRON, TIBC, FERRITIN  Attestation Statements:   Reviewed by clinician on day of visit: allergies, medications, problem list, medical history, surgical history, family history, social history, and previous encounter notes.   Trude Mcburney, am acting as transcriptionist for [employer], PA-C.  I have reviewed the above documentation for accuracy and completeness, and I agree with the above. [name], PA-C

## 2020-07-24 ENCOUNTER — Encounter: Payer: Self-pay | Admitting: Family Medicine

## 2020-07-27 ENCOUNTER — Ambulatory Visit (INDEPENDENT_AMBULATORY_CARE_PROVIDER_SITE_OTHER): Payer: No Typology Code available for payment source | Admitting: Registered Nurse

## 2020-07-30 ENCOUNTER — Ambulatory Visit (INDEPENDENT_AMBULATORY_CARE_PROVIDER_SITE_OTHER): Payer: No Typology Code available for payment source | Admitting: Family Medicine

## 2020-07-30 ENCOUNTER — Other Ambulatory Visit: Payer: Self-pay

## 2020-07-30 ENCOUNTER — Encounter: Payer: Self-pay | Admitting: Family Medicine

## 2020-07-30 VITALS — BP 120/80 | HR 89 | Temp 98.0°F | Ht 64.0 in | Wt 181.6 lb

## 2020-07-30 DIAGNOSIS — S01331A Puncture wound without foreign body of right ear, initial encounter: Secondary | ICD-10-CM | POA: Diagnosis not present

## 2020-07-30 NOTE — Patient Instructions (Signed)
Hydrogen peroxide 2x/day x 1-2 wks Use topical antibiotic ointment daily x 7-10 days Follow-up if needed

## 2020-07-30 NOTE — Progress Notes (Signed)
Alison Davies is a 35 y.o. female  Chief Complaint  Patient presents with  . Acute Visit    Rt ear soreness, bump appeared 2 weeks ago after the piercing  Piercing was done in Jan.    HPI: Alison Davies is a 35 y.o. female who got a Rt ear piercing in 05/2020 and for the past 2 weeks has experienced Rt ear soreness and a red bump. She wonders if it is a keloid.    No fever, chills. No drainage.  She did 1 applications of hydrogen peroxide. She has tried washing the area with antibacterial hand soap and saline solution. She states it "stings a little" when she applies saline.  Pt admits to not cleaning the area as regularly as she should.   Past Medical History:  Diagnosis Date  . Anxiety   . Hyperlipidemia   . Leg edema   . Migraines   . Vaginal Pap smear, abnormal 06/12/2019   LGSIL +HPV    History reviewed. No pertinent surgical history.  Social History   Socioeconomic History  . Marital status: Single    Spouse name: Not on file  . Number of children: Not on file  . Years of education: Not on file  . Highest education level: Not on file  Occupational History  . Occupation: [employer] manager: [employer]  Tobacco Use  . Smoking status: Never Smoker  . Smokeless tobacco: Never Used  Vaping Use  . Vaping Use: Never used  Substance and Sexual Activity  . Alcohol use: Yes    Comment: occasional  . Drug use: Never  . Sexual activity: Yes    Birth control/protection: Pill  Other Topics Concern  . Not on file  Social History Narrative  . Not on file   Social Determinants of Health   Financial Resource Strain: Not on file  Food Insecurity: Not on file  Transportation Needs: Not on file  Physical Activity: Not on file  Stress: Not on file  Social Connections: Not on file  Intimate Partner Violence: Not on file    Family History  Problem Relation Age of Onset  . Anxiety disorder Mother   . Diabetes Father   . Obesity Father      Immunization  History  Administered Date(s) Administered  . Influenza,inj,Quad PF,6+ Mos 01/16/2020  . Influenza-Unspecified 01/17/2014  . PFIZER(Purple Top)SARS-COV-2 Vaccination 05/30/2019, 06/18/2019, 02/18/2020  . PPD Test 02/24/2014, 03/06/2014, 03/27/2015, 03/28/2016  . Tdap 02/17/2014, 03/04/2018    Outpatient Encounter Medications as of 07/30/2020  Medication Sig  . ALPRAZolam (XANAX) 0.25 MG tablet Take 1 tablet (0.25 mg total) by mouth at bedtime as needed for anxiety.  . Biotin 3 MG TABS Take 2 tablets by mouth daily.  . Cyanocobalamin (VITAMIN B-12 CR) 1500 MCG TBCR Take 2 tablets by mouth 2 (two) times daily.  . ergocalciferol (VITAMIN D2) 1.25 MG (50000 UT) capsule Take 1 capsule (50,000 Units total) by mouth 2 (two) times a week.  . Melatonin 5 MG CHEW Chew 2 capsules by mouth at bedtime as needed.  . Multiple Vitamin (MULTIVITAMIN WITH MINERALS) TABS tablet Take 1 tablet by mouth daily.  . norethindrone (MICRONOR) 0.35 MG tablet TAKE 1 TABLET (0.35 MG TOTAL) BY MOUTH DAILY.  Marland Kitchen Semaglutide-Weight Management (WEGOVY) 1.7 MG/0.75ML SOAJ Inject 1.7 mg into the skin once a week.  . sertraline (ZOLOFT) 100 MG tablet Take 1 tablet (100 mg total) by mouth daily.  . SUMAtriptan (IMITREX) 25 MG  tablet Take 1 tablet (25 mg total) by mouth every 2 (two) hours as needed for migraine. May repeat in 2 hours if headache persists or recurs.  . topiramate (TOPAMAX) 100 MG tablet Take 1 tablet (100 mg total) by mouth at bedtime.  . Turmeric (QC TUMERIC COMPLEX PO) Take by mouth.  . vitamin C (ASCORBIC ACID) 250 MG tablet Take 250 mg by mouth daily.   No facility-administered encounter medications on file as of 07/30/2020.     ROS: Pertinent positives and negatives noted in HPI. Remainder of ROS non-contributory  No Known Allergies  There were no vitals taken for this visit.  Wt Readings from Last 3 Encounters:  07/15/20 175 lb (79.4 kg)  06/25/20 177 lb (80.3 kg)  06/24/20 181 lb (82.1 kg)    Temp Readings from Last 3 Encounters:  07/15/20 97.8 F (36.6 C)  06/25/20 97.8 F (36.6 C)  06/24/20 97.6 F (36.4 C) (Temporal)   BP Readings from Last 3 Encounters:  07/15/20 105/70  06/25/20 112/75  06/24/20 116/78   Pulse Readings from Last 3 Encounters:  07/15/20 100  06/25/20 94  06/24/20 92     Physical Exam Constitutional:      General: She is not in acute distress.    Appearance: She is not ill-appearing.  HENT:     Ears:   Neurological:     Mental Status: She is alert and oriented to person, place, and time.  Psychiatric:        Behavior: Behavior normal.      A/P:  1. Complication of ear piercing, right, initial encounter - small, erythematous papule at 2 o'clock position of the anterior aspect of piercing. No drainage, does not appear to be pus-filled - remove and clean earring daily - clean ear/piercing area BID with hydrogen peroxide  - apply triple abx ointment nightly x 7-10 days - f/u if symptoms worsen or do not improve in 10-14 days  This visit occurred during the SARS-CoV-2 public health emergency.  Safety protocols were in place, including screening questions prior to the visit, additional usage of staff PPE, and extensive cleaning of exam room while observing appropriate contact time as indicated for disinfecting solutions.

## 2020-08-03 ENCOUNTER — Telehealth (INDEPENDENT_AMBULATORY_CARE_PROVIDER_SITE_OTHER): Payer: Self-pay | Admitting: Obstetrics & Gynecology

## 2020-08-03 NOTE — Telephone Encounter (Signed)
Left message for pt to call back w/last pap info

## 2020-08-09 ENCOUNTER — Encounter (INDEPENDENT_AMBULATORY_CARE_PROVIDER_SITE_OTHER): Payer: Self-pay

## 2020-08-10 ENCOUNTER — Other Ambulatory Visit (INDEPENDENT_AMBULATORY_CARE_PROVIDER_SITE_OTHER): Payer: Self-pay

## 2020-08-10 ENCOUNTER — Encounter (INDEPENDENT_AMBULATORY_CARE_PROVIDER_SITE_OTHER): Payer: Self-pay | Admitting: Physician Assistant

## 2020-08-10 ENCOUNTER — Ambulatory Visit (INDEPENDENT_AMBULATORY_CARE_PROVIDER_SITE_OTHER): Payer: No Typology Code available for payment source | Admitting: Physician Assistant

## 2020-08-10 ENCOUNTER — Other Ambulatory Visit (HOSPITAL_BASED_OUTPATIENT_CLINIC_OR_DEPARTMENT_OTHER): Payer: Self-pay

## 2020-08-10 DIAGNOSIS — E669 Obesity, unspecified: Secondary | ICD-10-CM

## 2020-08-10 MED ORDER — SEMAGLUTIDE-WEIGHT MANAGEMENT 1.7 MG/0.75ML ~~LOC~~ SOAJ
1.7000 mg | SUBCUTANEOUS | 0 refills | Status: DC
  Filled 2020-08-10: qty 3, 28d supply, fill #0

## 2020-08-10 NOTE — Telephone Encounter (Signed)
Pt was r/s on behalf of the provider. Okay to send in a refill?

## 2020-08-10 NOTE — Telephone Encounter (Signed)
OK X1 

## 2020-08-31 ENCOUNTER — Encounter (INDEPENDENT_AMBULATORY_CARE_PROVIDER_SITE_OTHER): Payer: Self-pay | Admitting: Physician Assistant

## 2020-08-31 ENCOUNTER — Ambulatory Visit (INDEPENDENT_AMBULATORY_CARE_PROVIDER_SITE_OTHER): Payer: No Typology Code available for payment source | Admitting: Physician Assistant

## 2020-08-31 ENCOUNTER — Other Ambulatory Visit: Payer: Self-pay

## 2020-08-31 ENCOUNTER — Other Ambulatory Visit (HOSPITAL_BASED_OUTPATIENT_CLINIC_OR_DEPARTMENT_OTHER): Payer: Self-pay

## 2020-08-31 VITALS — BP 114/79 | HR 85 | Temp 98.1°F | Ht 64.0 in | Wt 175.0 lb

## 2020-08-31 DIAGNOSIS — E559 Vitamin D deficiency, unspecified: Secondary | ICD-10-CM | POA: Diagnosis not present

## 2020-08-31 DIAGNOSIS — Z6830 Body mass index (BMI) 30.0-30.9, adult: Secondary | ICD-10-CM

## 2020-08-31 DIAGNOSIS — E669 Obesity, unspecified: Secondary | ICD-10-CM

## 2020-08-31 DIAGNOSIS — Z9189 Other specified personal risk factors, not elsewhere classified: Secondary | ICD-10-CM

## 2020-08-31 MED ORDER — SEMAGLUTIDE-WEIGHT MANAGEMENT 1.7 MG/0.75ML ~~LOC~~ SOAJ
1.7000 mg | SUBCUTANEOUS | 0 refills | Status: DC
  Filled 2020-08-31 – 2020-09-01 (×2): qty 3, 28d supply, fill #0

## 2020-09-01 ENCOUNTER — Other Ambulatory Visit (HOSPITAL_BASED_OUTPATIENT_CLINIC_OR_DEPARTMENT_OTHER): Payer: Self-pay

## 2020-09-01 NOTE — Progress Notes (Signed)
Chief Complaint:   OBESITY Alison Davies is here to discuss her progress with her obesity treatment plan along with follow-up of her obesity related diagnoses. Alison Davies is on keeping a food journal and adhering to recommended goals of 1400-1500 calories and 95 grams of protein daily and states she is following her eating plan approximately 60% of the time. Alison Davies states she is doing 0 minutes 0 times per week.  Today's visit was #: 52 Starting weight: 199 lbs Starting date: 07/26/2017 Today's weight: 175 lbs Today's date: 08/31/2020 Total lbs lost to date: 24 Total lbs lost since last in-office visit: 0  Interim History: Alison Davies did well with maintaining her weight. She reports she has been stressed and she has not meal prepped well. She is eating out more often. She is having more carbohydrate cravings.  Subjective:   1. Vitamin D deficiency Alison Davies is on Vit D twice weekly, and she denies nausea, vomiting, or muscle weakness.  2. At risk for osteoporosis Alison Davies is at higher risk of osteopenia and osteoporosis due to Vitamin D deficiency.   Assessment/Plan:   1. Vitamin D deficiency Low Vitamin D level contributes to fatigue and are associated with obesity, breast, and colon cancer. Alison Davies agreed to continue taking prescription Vitamin D 50,000 IU twice weekly and will follow-up for routine testing of Vitamin D, at least 2-3 times per year to avoid over-replacement.  2. At risk for osteoporosis Alison Davies was given approximately 15 minutes of osteoporosis prevention counseling today. Alison Davies is at risk for osteopenia and osteoporosis due to her Vitamin D deficiency. She was encouraged to take her Vitamin D and follow her higher calcium diet and increase strengthening exercise to help strengthen her bones and decrease her risk of osteopenia and osteoporosis.  Repetitive spaced learning was employed today to elicit superior memory formation and behavioral change.  3. Class 1 obesity with serious  comorbidity and body mass index (BMI) of 30.0 to 30.9 in adult, unspecified obesity type Alison Davies is currently in the action stage of change. As such, her goal is to continue with weight loss efforts. She has agreed to keeping a food journal and adhering to recommended goals of 1500 calories and 95 grams of protein daily.   We discussed various medication options to help Alison Davies with her weight loss efforts and we both agreed to continue [place], and we will refill for 1 month.  - Semaglutide-Weight Management 1.7 MG/0.75ML SOAJ; INJECT 1.7 MG INTO THE SKIN ONCE A WEEK.  Dispense: 3 mL; Refill: 0  Exercise goals: No exercise has been prescribed at this time.  Behavioral modification strategies: decreasing eating out, meal planning and cooking strategies and keeping healthy foods in the home.  Alison Davies has agreed to follow-up with our clinic in 3 weeks. She was informed of the importance of frequent follow-up visits to maximize her success with intensive lifestyle modifications for her multiple health conditions.   Objective:   Blood pressure 114/79, pulse 85, temperature 98.1 F (36.7 C), height 5\' 4"  (1.626 m), weight 175 lb (79.4 kg), SpO2 97 %. Body mass index is 30.04 kg/m.  General: Cooperative, alert, well developed, in no acute distress. HEENT: Conjunctivae and lids unremarkable. Cardiovascular: Regular rhythm.  Lungs: Normal work of breathing. Neurologic: No focal deficits.   Lab Results  Component Value Date   CREATININE 0.66 03/24/2020   BUN 14 03/24/2020   NA 141 03/24/2020   K 4.6 03/24/2020   CL 108 (H) 03/24/2020   CO2 22 03/24/2020  Lab Results  Component Value Date   ALT 14 03/24/2020   AST 10 03/24/2020   ALKPHOS 79 03/24/2020   BILITOT 0.4 03/24/2020   Lab Results  Component Value Date   HGBA1C 4.9 03/24/2020   HGBA1C 4.8 10/23/2019   HGBA1C 4.8 06/13/2019   HGBA1C 5.0 11/13/2018   HGBA1C 4.9 04/19/2018   Lab Results  Component Value Date   INSULIN 18.6  03/24/2020   INSULIN 11.1 10/23/2019   INSULIN 15.4 06/13/2019   INSULIN 14.4 11/13/2018   INSULIN 21.8 04/19/2018   Lab Results  Component Value Date   TSH 1.850 07/26/2017   Lab Results  Component Value Date   CHOL 220 (H) 03/24/2020   HDL 43 03/24/2020   LDLCALC 159 (H) 03/24/2020   TRIG 99 03/24/2020   CHOLHDL 5.1 (H) 03/24/2020   Lab Results  Component Value Date   WBC 7.1 06/12/2019   HGB 14.0 06/12/2019   HCT 41.0 06/12/2019   MCV 89.7 06/12/2019   PLT 299.0 06/12/2019   No results found for: IRON, TIBC, FERRITIN  Attestation Statements:   Reviewed by clinician on day of visit: allergies, medications, problem list, medical history, surgical history, family history, social history, and previous encounter notes.   Trude Mcburney, am acting as transcriptionist for [employer], PA-C.  I have reviewed the above documentation for accuracy and completeness, and I agree with the above. [name], PA-C

## 2020-09-04 ENCOUNTER — Ambulatory Visit (INDEPENDENT_AMBULATORY_CARE_PROVIDER_SITE_OTHER): Payer: No Typology Code available for payment source | Admitting: Obstetrics & Gynecology

## 2020-09-04 ENCOUNTER — Other Ambulatory Visit (HOSPITAL_BASED_OUTPATIENT_CLINIC_OR_DEPARTMENT_OTHER): Payer: Self-pay

## 2020-09-07 ENCOUNTER — Other Ambulatory Visit (HOSPITAL_BASED_OUTPATIENT_CLINIC_OR_DEPARTMENT_OTHER): Payer: Self-pay

## 2020-09-08 ENCOUNTER — Other Ambulatory Visit (HOSPITAL_BASED_OUTPATIENT_CLINIC_OR_DEPARTMENT_OTHER): Payer: Self-pay

## 2020-09-09 MED FILL — Norethindrone Tab 0.35 MG: ORAL | 84 days supply | Qty: 84 | Fill #0 | Status: CN

## 2020-09-10 ENCOUNTER — Other Ambulatory Visit: Payer: Self-pay | Admitting: Family Medicine

## 2020-09-10 ENCOUNTER — Other Ambulatory Visit (HOSPITAL_BASED_OUTPATIENT_CLINIC_OR_DEPARTMENT_OTHER): Payer: Self-pay

## 2020-09-10 MED ORDER — NORETHINDRONE 0.35 MG PO TABS
1.0000 | ORAL_TABLET | Freq: Every day | ORAL | 3 refills | Status: DC
  Filled 2020-09-10: qty 84, 84d supply, fill #0

## 2020-09-11 ENCOUNTER — Other Ambulatory Visit (HOSPITAL_BASED_OUTPATIENT_CLINIC_OR_DEPARTMENT_OTHER): Payer: Self-pay

## 2020-09-13 ENCOUNTER — Encounter (INDEPENDENT_AMBULATORY_CARE_PROVIDER_SITE_OTHER): Payer: Self-pay | Admitting: Physician Assistant

## 2020-09-14 ENCOUNTER — Other Ambulatory Visit (HOSPITAL_BASED_OUTPATIENT_CLINIC_OR_DEPARTMENT_OTHER): Payer: Self-pay

## 2020-09-14 NOTE — Telephone Encounter (Signed)
Pt last seen by Tracey Aguilar, PA-C.  

## 2020-09-24 ENCOUNTER — Ambulatory Visit (INDEPENDENT_AMBULATORY_CARE_PROVIDER_SITE_OTHER): Payer: No Typology Code available for payment source | Admitting: Physician Assistant

## 2020-09-24 ENCOUNTER — Other Ambulatory Visit (HOSPITAL_BASED_OUTPATIENT_CLINIC_OR_DEPARTMENT_OTHER): Payer: Self-pay

## 2020-09-24 ENCOUNTER — Other Ambulatory Visit: Payer: Self-pay

## 2020-09-24 VITALS — BP 110/75 | HR 72 | Temp 97.9°F | Ht 64.0 in | Wt 176.0 lb

## 2020-09-24 DIAGNOSIS — E785 Hyperlipidemia, unspecified: Secondary | ICD-10-CM | POA: Diagnosis not present

## 2020-09-24 DIAGNOSIS — E559 Vitamin D deficiency, unspecified: Secondary | ICD-10-CM

## 2020-09-24 DIAGNOSIS — E669 Obesity, unspecified: Secondary | ICD-10-CM | POA: Diagnosis not present

## 2020-09-24 DIAGNOSIS — Z6830 Body mass index (BMI) 30.0-30.9, adult: Secondary | ICD-10-CM

## 2020-09-24 DIAGNOSIS — Z9189 Other specified personal risk factors, not elsewhere classified: Secondary | ICD-10-CM | POA: Diagnosis not present

## 2020-09-24 MED ORDER — VITAMIN D (ERGOCALCIFEROL) 1.25 MG (50000 UNIT) PO CAPS
ORAL_CAPSULE | ORAL | 0 refills | Status: DC
  Filled 2020-09-24: qty 10, 35d supply, fill #0

## 2020-09-29 ENCOUNTER — Encounter: Payer: Self-pay | Admitting: Family Medicine

## 2020-09-29 ENCOUNTER — Other Ambulatory Visit (HOSPITAL_BASED_OUTPATIENT_CLINIC_OR_DEPARTMENT_OTHER): Payer: Self-pay

## 2020-09-29 ENCOUNTER — Other Ambulatory Visit: Payer: Self-pay

## 2020-09-29 DIAGNOSIS — G43709 Chronic migraine without aura, not intractable, without status migrainosus: Secondary | ICD-10-CM

## 2020-09-29 DIAGNOSIS — F411 Generalized anxiety disorder: Secondary | ICD-10-CM

## 2020-09-29 MED ORDER — SERTRALINE HCL 100 MG PO TABS
100.0000 mg | ORAL_TABLET | Freq: Every day | ORAL | 0 refills | Status: DC
  Filled 2020-09-29: qty 30, 30d supply, fill #0

## 2020-09-29 MED ORDER — TOPIRAMATE 100 MG PO TABS
100.0000 mg | ORAL_TABLET | Freq: Every day | ORAL | 0 refills | Status: DC
  Filled 2020-09-29: qty 30, 30d supply, fill #0

## 2020-09-29 NOTE — Progress Notes (Signed)
Chief Complaint:   OBESITY Alison Davies is here to discuss her progress with her obesity treatment plan along with follow-up of her obesity related diagnoses. Alison Davies is on keeping a food journal and adhering to recommended goals of 1500 calories and 95 grams of protein daily and states she is following her eating plan approximately 60% of the time. Alison Davies states she is doing 0 minutes 0 times per week.  Today's visit was #: 53 Starting weight: 199 lbs Starting date: 07/26/2017 Today's weight: 176 lbs Today's date: 09/24/2020 Total lbs lost to date: 23 Total lbs lost since last in-office visit: 0  Interim History: Alison Davies has been very busy with [employer] school and working. She is feeling "burned out". She has been ordering out more often but she is trying to make healthy choices.  Subjective:   1. Vitamin D deficiency [name] is on Vit D 2 times per week, and she is tolerating it well.  2. Hyperlipidemia, unspecified hyperlipidemia type Alison Davies is not on medications and she denies chest pain. She is not exercising.  3. At risk for heart disease Alison Davies is at a higher than average risk for cardiovascular disease due to obesity.   Assessment/Plan:   1. Vitamin D deficiency Low Vitamin D level contributes to fatigue and are associated with obesity, breast, and colon cancer. We will refill prescription Vitamin D for 1 month. Raquell will follow-up for routine testing of Vitamin D, at least 2-3 times per year to avoid over-replacement.  - Vitamin D, Ergocalciferol, (DRISDOL) 1.25 MG (50000 UNIT) CAPS capsule; TAKE 1 CAPSULE (50,000 UNITS TOTAL) BY MOUTH 2 (TWO) TIMES A WEEK.  Dispense: 10 capsule; Refill: 0  2. Hyperlipidemia, unspecified hyperlipidemia type Cardiovascular risk and specific lipid/LDL goals reviewed. We discussed several lifestyle modifications today. Alison Davies will continue her meal plan, and will increase walking with the dogs. Orders and follow up as documented in patient record.    Counseling Intensive lifestyle modifications are the first line treatment for this issue. . Dietary changes: Increase soluble fiber. Decrease simple carbohydrates. . Exercise changes: Moderate to vigorous-intensity aerobic activity 150 minutes per week if tolerated. . Lipid-lowering medications: see documented in medical record.  3. At risk for heart disease Alison Davies was given approximately 15 minutes of coronary artery disease prevention counseling today. She is 35 y.o. female and has risk factors for heart disease including obesity. We discussed intensive lifestyle modifications today with an emphasis on specific weight loss instructions and strategies.   Repetitive spaced learning was employed today to elicit superior memory formation and behavioral change.  4. Class 1 obesity with serious comorbidity and body mass index (BMI) of 30.0 to 30.9 in adult, unspecified obesity type Alison Davies is currently in the action stage of change. As such, her goal is to continue with weight loss efforts. She has agreed to keeping a food journal and adhering to recommended goals of 1500 calories and 95 grams of protein daily.   We discussed various medication options to help Alison Davies with her weight loss efforts and we both agreed to continue Wegovy 1.7 mg.  Exercise goals: No exercise has been prescribed at this time.  Behavioral modification strategies: meal planning and cooking strategies and keeping healthy foods in the home.  Alison Davies has agreed to follow-up with our clinic in 4 weeks. She was informed of the importance of frequent follow-up visits to maximize her success with intensive lifestyle modifications for her multiple health conditions.   Objective:   Blood pressure 110/75, pulse 72,  temperature 97.9 F (36.6 C), height 5\' 4"  (1.626 m), weight 176 lb (79.8 kg), SpO2 98 %. Body mass index is 30.21 kg/m.  General: Cooperative, alert, well developed, in no acute distress. HEENT: Conjunctivae and  lids unremarkable. Cardiovascular: Regular rhythm.  Lungs: Normal work of breathing. Neurologic: No focal deficits.   Lab Results  Component Value Date   CREATININE 0.66 03/24/2020   BUN 14 03/24/2020   NA 141 03/24/2020   K 4.6 03/24/2020   CL 108 (H) 03/24/2020   CO2 22 03/24/2020   Lab Results  Component Value Date   ALT 14 03/24/2020   AST 10 03/24/2020   ALKPHOS 79 03/24/2020   BILITOT 0.4 03/24/2020   Lab Results  Component Value Date   HGBA1C 4.9 03/24/2020   HGBA1C 4.8 10/23/2019   HGBA1C 4.8 06/13/2019   HGBA1C 5.0 11/13/2018   HGBA1C 4.9 04/19/2018   Lab Results  Component Value Date   INSULIN 18.6 03/24/2020   INSULIN 11.1 10/23/2019   INSULIN 15.4 06/13/2019   INSULIN 14.4 11/13/2018   INSULIN 21.8 04/19/2018   Lab Results  Component Value Date   TSH 1.850 07/26/2017   Lab Results  Component Value Date   CHOL 220 (H) 03/24/2020   HDL 43 03/24/2020   LDLCALC 159 (H) 03/24/2020   TRIG 99 03/24/2020   CHOLHDL 5.1 (H) 03/24/2020   Lab Results  Component Value Date   WBC 7.1 06/12/2019   HGB 14.0 06/12/2019   HCT 41.0 06/12/2019   MCV 89.7 06/12/2019   PLT 299.0 06/12/2019   No results found for: IRON, TIBC, FERRITIN  Attestation Statements:   Reviewed by clinician on day of visit: allergies, medications, problem list, medical history, surgical history, family history, social history, and previous encounter notes.   08/10/2019, am acting as transcriptionist for Trude Mcburney, PA-C.  I have reviewed the above documentation for accuracy and completeness, and I agree with the above. -  *[employer], PA-C

## 2020-10-03 ENCOUNTER — Encounter: Payer: Self-pay | Admitting: Family Medicine

## 2020-10-08 ENCOUNTER — Other Ambulatory Visit: Payer: Self-pay

## 2020-10-09 ENCOUNTER — Other Ambulatory Visit (HOSPITAL_COMMUNITY)
Admission: RE | Admit: 2020-10-09 | Discharge: 2020-10-09 | Disposition: A | Discharge status: Home / Self Care | Payer: No Typology Code available for payment source | Source: Ambulatory Visit | Attending: Obstetrics & Gynecology | Admitting: Obstetrics & Gynecology

## 2020-10-09 ENCOUNTER — Other Ambulatory Visit (HOSPITAL_BASED_OUTPATIENT_CLINIC_OR_DEPARTMENT_OTHER): Payer: Self-pay

## 2020-10-09 ENCOUNTER — Ambulatory Visit (INDEPENDENT_AMBULATORY_CARE_PROVIDER_SITE_OTHER): Payer: No Typology Code available for payment source | Admitting: Obstetrics & Gynecology

## 2020-10-09 ENCOUNTER — Encounter: Payer: Self-pay | Admitting: Obstetrics & Gynecology

## 2020-10-09 VITALS — BP 117/73 | HR 90 | Ht 64.0 in | Wt 179.0 lb

## 2020-10-09 DIAGNOSIS — Z01419 Encounter for gynecological examination (general) (routine) without abnormal findings: Secondary | ICD-10-CM | POA: Diagnosis not present

## 2020-10-09 DIAGNOSIS — Z113 Encounter for screening for infections with a predominantly sexual mode of transmission: Secondary | ICD-10-CM

## 2020-10-09 DIAGNOSIS — R102 Pelvic and perineal pain: Secondary | ICD-10-CM

## 2020-10-09 DIAGNOSIS — Z3009 Encounter for other general counseling and advice on contraception: Secondary | ICD-10-CM | POA: Diagnosis not present

## 2020-10-09 LAB — POCT URINALYSIS DIPSTICK
Appearance: ABNORMAL
Bilirubin, UA: NEGATIVE
Blood, UA: NEGATIVE
Glucose, UA: NEGATIVE
Ketones, UA: NEGATIVE
Nitrite, UA: NEGATIVE
Protein, UA: NEGATIVE
Spec Grav, UA: >=1.03 — AB (ref 1.010–1.025)
Urobilinogen, UA: 0.2 E.U./dL
pH, UA: 5 (ref 5.0–8.0)

## 2020-10-09 MED ORDER — SULFAMETHOXAZOLE-TRIMETHOPRIM 800-160 MG PO TABS
1.0000 | ORAL_TABLET | Freq: Two times a day (BID) | ORAL | 1 refills | Status: DC
  Filled 2020-10-09: qty 14, 7d supply, fill #0

## 2020-10-09 MED ORDER — NORETHINDRONE 0.35 MG PO TABS
1.0000 | ORAL_TABLET | Freq: Every day | ORAL | 3 refills | Status: DC
  Filled 2020-10-09 – 2021-04-27 (×2): qty 84, 84d supply, fill #0
  Filled 2021-09-12: qty 84, 84d supply, fill #1

## 2020-10-09 MED ORDER — DOXYCYCLINE HYCLATE 100 MG PO CAPS
100.0000 mg | ORAL_CAPSULE | Freq: Two times a day (BID) | ORAL | 0 refills | Status: DC
  Filled 2020-10-09: qty 14, 7d supply, fill #0

## 2020-10-09 NOTE — Progress Notes (Signed)
Subjective:     Alison Davies is a 35 y.o. female here for a routine exam. She has had a LGSIL Pap. She is s/p colpo with bx. Current complaints: Pt reports pelvic pain in midline for 1 week. She was having intercourse and started having pain and had to stop. She denies dysuria but, does feel midline pressure. She is worried about ovarian cysts which she says run in her family. She is sexually active. Unmarried. She does report that it is possible that she has a STI. She uses withdrawal for contraception. She was on OCPs but, stooped them. She reports that the biggest issues was remembering to take the pills. She reports HA with aura.     Gynecologic History Patient's last menstrual period was 09/10/2020 (approximate). Contraception: none Last Pap: 08/2019. Results were: abnormal Last mammogram: n/a.   Obstetric History OB History  Gravida Para Term Preterm AB Living  0 0 0 0 0 0  SAB IAB Ectopic Multiple Live Births  0 0 0 0 0   The following portions of the patient's history were reviewed and updated as appropriate: allergies, current medications, past family history, past medical history, past social history, past surgical history, and problem list.  Review of Systems Pertinent items are noted in HPI.    Objective:  BP 117/73 (BP Location: Right Arm, Patient Position: Sitting, Cuff Size: Normal)   Pulse 90   Ht 5\' 4"  (1.626 m)   Wt 179 lb (81.2 kg)   LMP 09/10/2020 (Approximate)   BMI 30.73 kg/m  General Appearance:    Alert, cooperative, no distress, appears stated age  Head:    Normocephalic, without obvious abnormality, atraumatic  Eyes:    conjunctiva/corneas clear, EOM's intact, both eyes  Ears:    Normal external ear canals, both ears  Nose:   Nares normal, septum midline, mucosa normal, no drainage    or sinus tenderness  Throat:   Lips, mucosa, and tongue normal; teeth and gums normal  Neck:   Supple, symmetrical, trachea midline, no adenopathy;    thyroid:  no  enlargement/tenderness/nodules  Back:     Symmetric, no curvature, ROM normal, no CVA tenderness  Lungs:     respirations unlabored  Chest Wall:    No tenderness or deformity   Heart:    Regular rate and rhythm  Breast Exam:    No tenderness, masses, or nipple abnormality  Abdomen:     Soft, non-tender, bowel sounds active all four quadrants,    no masses, no organomegaly  Genitalia:    Normal female without lesion, discharge or tenderness   +CMT and bilateral adnexal tenderness. No masses palpated.   Extremities:   Extremities normal, atraumatic, no cyanosis or edema  Pulses:   2+ and symmetric all extremities  Skin:   Skin color, texture, turgor normal, no rashes or lesions; tattoo on right arm of skull.     4/7//2021 CERVIX, BIOPSY:  - Low-grade squamous intraepithelial lesion (CIN1, low grade dysplasia)   UA: + leuk no nitrates.  Assessment:    Healthy female exam.  Pelvic pain- r/o STI vs UTI. Pt given option of awaiting results vs treating now and th she opts for treatment now. Will send cervical cx and urine cx.   Contraception counseling. Pt wants to try POPs again. She will work on a system to remember her pills.  LGSIL- review the potential course of action. It the LGSIL persists at this point, we will recommend LEEP.  Plan:   Missey was seen today for gynecologic exam.  Diagnoses and all orders for this visit:  Well female exam with routine gynecological exam -     Cytology - PAP -     Cervicovaginal ancillary only( [place]) -     POCT urinalysis dipstick  Pelvic pain in female -     Urine Culture -     doxycycline (VIBRAMYCIN) 100 MG capsule; Take 1 capsule (100 mg total) by mouth 2 (two) times daily. -     sulfamethoxazole-trimethoprim (BACTRIM DS) 800-160 MG tablet; Take 1 tablet by mouth 2 (two) times daily.  Routine screening for STI (sexually transmitted infection)  Encounter for other general counseling or advice on contraception -      norethindrone (MICRONOR) 0.35 MG tablet; TAKE 1 TABLET BY MOUTH ONCE DAILY   If cx are neg and sx persists, rec [name].  F/u in 1 year or sooner prn   Aldair Rickel L. Harraway-Smith, M.D., [place]

## 2020-10-09 NOTE — Progress Notes (Signed)
Oct Pt presents today for yearly exam. LMP: 09/10/20. BCM: OCP Last pap: 06/12/19 LGSIL, Colpo: 4/21 LGSIL. Pt c/o pelvic pain.

## 2020-10-11 LAB — CERVICOVAGINAL ANCILLARY ONLY
Bacterial Vaginitis (gardnerella): POSITIVE — AB
Candida Glabrata: NEGATIVE
Candida Vaginitis: NEGATIVE
Chlamydia: NEGATIVE
Comment: NEGATIVE
Comment: NEGATIVE
Comment: NEGATIVE
Comment: NEGATIVE
Comment: NEGATIVE
Comment: NORMAL
Neisseria Gonorrhea: NEGATIVE
Trichomonas: NEGATIVE

## 2020-10-12 ENCOUNTER — Other Ambulatory Visit: Payer: Self-pay | Admitting: Obstetrics & Gynecology

## 2020-10-12 ENCOUNTER — Other Ambulatory Visit (HOSPITAL_BASED_OUTPATIENT_CLINIC_OR_DEPARTMENT_OTHER): Payer: Self-pay

## 2020-10-12 DIAGNOSIS — B9689 Other specified bacterial agents as the cause of diseases classified elsewhere: Secondary | ICD-10-CM

## 2020-10-12 DIAGNOSIS — N760 Acute vaginitis: Secondary | ICD-10-CM

## 2020-10-12 MED ORDER — METRONIDAZOLE 500 MG PO TABS
500.0000 mg | ORAL_TABLET | Freq: Two times a day (BID) | ORAL | 0 refills | Status: DC
  Filled 2020-10-12: qty 14, 7d supply, fill #0

## 2020-10-13 LAB — CYTOLOGY - PAP
Comment: NEGATIVE
High risk HPV: POSITIVE — AB

## 2020-10-13 LAB — URINE CULTURE

## 2020-10-16 ENCOUNTER — Telehealth: Payer: Self-pay

## 2020-10-16 NOTE — Telephone Encounter (Signed)
Called Pt to inform of Pap Smear results of LGSIL+HPV and needing a Colposcopy. Advised someone from the front desk will call her to make an appoint.Pt verbalized understanding. Will send chart to the [facility] office for scheduling.

## 2020-10-16 NOTE — Telephone Encounter (Signed)
-----   Message from Willodean Rosenthal, MD sent at 10/16/2020 11:28 AM EDT ----- Please call pt. She has a LGSIL pap. She will need a colposcopy.  Nursing: Please call pt to notify her and arrange colposcopy. .    Admin: please schedule colposcopy.   Please confirm completion with response to this note.   Thanks,   Cl[facility]-S

## 2020-10-22 ENCOUNTER — Encounter: Payer: Self-pay | Admitting: Family Medicine

## 2020-10-27 ENCOUNTER — Other Ambulatory Visit: Payer: Self-pay

## 2020-10-27 ENCOUNTER — Ambulatory Visit (INDEPENDENT_AMBULATORY_CARE_PROVIDER_SITE_OTHER): Payer: No Typology Code available for payment source | Admitting: Physician Assistant

## 2020-10-27 ENCOUNTER — Other Ambulatory Visit (HOSPITAL_BASED_OUTPATIENT_CLINIC_OR_DEPARTMENT_OTHER): Payer: Self-pay

## 2020-10-27 VITALS — BP 107/77 | HR 88 | Temp 98.2°F | Ht 64.0 in | Wt 178.0 lb

## 2020-10-27 DIAGNOSIS — Z9189 Other specified personal risk factors, not elsewhere classified: Secondary | ICD-10-CM

## 2020-10-27 DIAGNOSIS — E8881 Metabolic syndrome: Secondary | ICD-10-CM | POA: Diagnosis not present

## 2020-10-27 DIAGNOSIS — E559 Vitamin D deficiency, unspecified: Secondary | ICD-10-CM

## 2020-10-27 DIAGNOSIS — E785 Hyperlipidemia, unspecified: Secondary | ICD-10-CM

## 2020-10-27 DIAGNOSIS — E669 Obesity, unspecified: Secondary | ICD-10-CM

## 2020-10-27 DIAGNOSIS — Z6830 Body mass index (BMI) 30.0-30.9, adult: Secondary | ICD-10-CM

## 2020-10-27 MED ORDER — SEMAGLUTIDE-WEIGHT MANAGEMENT 1.7 MG/0.75ML ~~LOC~~ SOAJ
1.7000 mg | SUBCUTANEOUS | 0 refills | Status: DC
  Filled 2020-10-27: qty 3, 28d supply, fill #0

## 2020-10-28 ENCOUNTER — Encounter: Payer: Self-pay | Admitting: Family Medicine

## 2020-10-28 ENCOUNTER — Other Ambulatory Visit: Payer: Self-pay

## 2020-10-28 ENCOUNTER — Other Ambulatory Visit (HOSPITAL_BASED_OUTPATIENT_CLINIC_OR_DEPARTMENT_OTHER): Payer: Self-pay

## 2020-10-28 ENCOUNTER — Ambulatory Visit (INDEPENDENT_AMBULATORY_CARE_PROVIDER_SITE_OTHER): Payer: No Typology Code available for payment source | Admitting: Family Medicine

## 2020-10-28 VITALS — BP 114/78 | HR 96 | Temp 97.8°F | Ht 64.0 in | Wt 181.2 lb

## 2020-10-28 DIAGNOSIS — F411 Generalized anxiety disorder: Secondary | ICD-10-CM | POA: Diagnosis not present

## 2020-10-28 DIAGNOSIS — F339 Major depressive disorder, recurrent, unspecified: Secondary | ICD-10-CM | POA: Diagnosis not present

## 2020-10-28 LAB — LIPID PANEL
Chol/HDL Ratio: 4 ratio (ref 0.0–4.4)
Cholesterol, Total: 247 mg/dL — ABNORMAL HIGH (ref 100–199)
HDL: 62 mg/dL (ref >39)
LDL Chol Calc (NIH): 166 mg/dL — ABNORMAL HIGH (ref 0–99)
Triglycerides: 108 mg/dL (ref 0–149)
VLDL Cholesterol Cal: 19 mg/dL (ref 5–40)

## 2020-10-28 LAB — COMPREHENSIVE METABOLIC PANEL
ALT: 24 IU/L (ref 0–32)
AST: 20 IU/L (ref 0–40)
Albumin/Globulin Ratio: 2 (ref 1.2–2.2)
Albumin: 4.7 g/dL (ref 3.8–4.8)
Alkaline Phosphatase: 69 IU/L (ref 44–121)
BUN/Creatinine Ratio: 12 (ref 9–23)
BUN: 8 mg/dL (ref 6–20)
Bilirubin Total: 0.3 mg/dL (ref 0.0–1.2)
CO2: 23 mmol/L (ref 20–29)
Calcium: 9.5 mg/dL (ref 8.7–10.2)
Chloride: 100 mmol/L (ref 96–106)
Creatinine, Ser: 0.65 mg/dL (ref 0.57–1.00)
Globulin, Total: 2.4 g/dL (ref 1.5–4.5)
Glucose: 81 mg/dL (ref 65–99)
Potassium: 4.1 mmol/L (ref 3.5–5.2)
Sodium: 138 mmol/L (ref 134–144)
Total Protein: 7.1 g/dL (ref 6.0–8.5)
eGFR: 119 mL/min/{1.73_m2} (ref >59)

## 2020-10-28 LAB — VITAMIN D 25 HYDROXY (VIT D DEFICIENCY, FRACTURES): Vit D, 25-Hydroxy: 27.6 ng/mL — ABNORMAL LOW (ref 30.0–100.0)

## 2020-10-28 LAB — INSULIN, RANDOM: INSULIN: 12.6 u[IU]/mL (ref 2.6–24.9)

## 2020-10-28 MED ORDER — SERTRALINE HCL 100 MG PO TABS
150.0000 mg | ORAL_TABLET | Freq: Every day | ORAL | 1 refills | Status: DC
  Filled 2020-10-28: qty 135, 90d supply, fill #0
  Filled 2021-04-27: qty 135, 90d supply, fill #1

## 2020-10-28 NOTE — Progress Notes (Signed)
Chief Complaint  Patient presents with   Follow-up    Pt would like to discuss increasing medication for depression.     HPI: Alison Davies is a 35 y.o. female here to discuss concerns about anxiety/depression. Pt is on zoloft 100mg  daily. She states she has trouble being "consistent" with medication. She estimates she takes it 5-6 days per week.  Pt is working at and is also in school but is almost done. She feels overwhelmed at times. She has financial concerns - housing, gas, groceries. She feels like she is letting her home go - feels messy, cluttered.   Psychiatrist or therapist: no Diet: on wegovy, feels she is paying attention to hunger signals Exercise: low - blames schedule  Depression screen [facility] 2/9 06/24/2020 01/16/2020 06/12/2019  Decreased Interest 1 3 2   Down, Depressed, Hopeless 2 3 2   PHQ - 2 Score 3 6 4   Altered sleeping 3 3 1   Tired, decreased energy 2 2 1   Change in appetite 0 3 0  Feeling bad or failure about yourself  1 2 0  Trouble concentrating 1 1 0  Moving slowly or fidgety/restless 0 2 0  Suicidal thoughts 0 1 0  PHQ-9 Score 10 20 6   Difficult doing work/chores Somewhat difficult Extremely dIfficult Very difficult  Some encounter information is confidential and restricted. Go to Review Flowsheets activity to see all data.  Some recent data might be hidden    GAD 7 : Generalized Anxiety Score 06/24/2020 01/16/2020 06/12/2019 08/03/2018  Nervous, Anxious, on Edge 1 2 0 3  Control/stop worrying 1 3 0 3  Worry too much - different things 1 3 2 3   Trouble relaxing 2 3 0 3  Restless 2 2 0 3  Easily annoyed or irritable 1 3 1 3   Afraid - awful might happen 0 1 0 3  Total GAD 7 Score 8 17 3 21   Anxiety Difficulty Somewhat difficult Extremely difficult Somewhat difficult -  Some encounter information is confidential and restricted. Go to Review Flowsheets activity to see all data.      Past Medical History:  Diagnosis Date   Anxiety     Hyperlipidemia    Leg edema    Migraines    Vaginal Pap smear, abnormal 06/12/2019   LGSIL +HPV    Past Surgical History:  Procedure Laterality Date   COLPOSCOPY N/A     Social History   Socioeconomic History   Marital status: Single    Spouse name: Not on file   Number of children: Not on file   Years of education: Not on file   Highest education level: Not on file  Occupational History   Occupation: Rad : [employer]  Tobacco Use   Smoking status: Never   Smokeless tobacco: Never  Vaping Use   Vaping Use: Never used  Substance and Sexual Activity   Alcohol use: Yes    Comment: occasional   Drug use: Never   Sexual activity: Yes    Birth control/protection: Pill  Other Topics Concern   Not on file  Social History Narrative   Not on file   Social Determinants of Health   Financial Resource Strain: Not on file  Food Insecurity: Not on file  Transportation Needs: Not on file  Physical Activity: Not on file  Stress: Not on file  Social Connections: Not on file  Intimate Partner Violence: Not on file    Family History  Problem Relation Age of Onset   Anxiety disorder Mother    Diabetes Father    Obesity Father      Immunization History  Administered Date(s) Administered   Influenza,inj,Quad PF,6+ Mos 01/16/2020   Influenza-Unspecified 01/17/2014   PFIZER(Purple Top)SARS-COV-2 Vaccination 05/30/2019, 06/18/2019, 02/18/2020   PPD Test 02/24/2014, 03/06/2014, 03/27/2015, 03/28/2016   Tdap 02/17/2014, 03/04/2018    Outpatient Encounter Medications as of 10/28/2020  Medication Sig   Biotin 3 MG TABS Take 2 tablets by mouth daily.   Cyanocobalamin (VITAMIN B-12 CR) 1500 MCG TBCR Take 2 tablets by mouth 2 (two) times daily.   ergocalciferol (VITAMIN D2) 1.25 MG (50000 UT) capsule Take 1 capsule (50,000 Units total) by mouth 2 (two) times a week.   Melatonin 5 MG CHEW Chew 2 capsules by mouth at bedtime as needed.   metroNIDAZOLE (FLAGYL) 500  MG tablet Take 1 tablet (500 mg total) by mouth 2 (two) times daily.   Multiple Vitamin (MULTIVITAMIN WITH MINERALS) TABS tablet Take 1 tablet by mouth daily.   norethindrone (MICRONOR) 0.35 MG tablet TAKE 1 TABLET BY MOUTH ONCE DAILY   Semaglutide-Weight Management 1.7 MG/0.75ML SOAJ INJECT 1.7 MG INTO THE SKIN ONCE A WEEK.   sertraline (ZOLOFT) 100 MG tablet Take 1 tablet (100 mg total) by mouth daily. **Needs appt before anymore refills given**   SUMAtriptan (IMITREX) 25 MG tablet Take 1 tablet (25 mg total) by mouth every 2 (two) hours as needed for migraine. May repeat in 2 hours if headache persists or recurs.   topiramate (TOPAMAX) 100 MG tablet Take 1 tablet (100 mg total) by mouth at bedtime. **Needs appt before anymore refills given**   Turmeric (QC TUMERIC COMPLEX PO) Take by mouth.   vitamin C (ASCORBIC ACID) 250 MG tablet Take 250 mg by mouth daily.   Vitamin D, Ergocalciferol, (DRISDOL) 1.25 MG (50000 UNIT) CAPS capsule TAKE 1 CAPSULE (50,000 UNITS TOTAL) BY MOUTH 2 (TWO) TIMES A WEEK.   No facility-administered encounter medications on file as of 10/28/2020.     ROS: Pertinent positives and negatives noted in HPI. Remainder of ROS non-contributory   No Known Allergies  BP 114/78 (BP Location: Right Arm, Patient Position: Sitting, Cuff Size: Normal)   Pulse 96   Temp 97.8 F (36.6 C) (Temporal)   Ht 5\' 4"  (1.626 m)   Wt 181 lb 3.2 oz (82.2 kg)   LMP 10/17/2020   SpO2 99%   BMI 31.10 kg/m   Physical Exam Constitutional:      General: She is not in acute distress.    Appearance: Normal appearance. She is not ill-appearing.  Pulmonary:     Effort: No respiratory distress.  Neurological:     Mental Status: She is alert and oriented to person, place, and time.  Psychiatric:        Mood and Affect: Mood normal.        Behavior: Behavior normal.     A/P:  1. GAD (generalized anxiety disorder) 2. Depression, recurrent (HCC) - PHQ-9 = 17, GAD-7 = 15 - recommend  BH counseling, info included in AVS Increase: - sertraline (ZOLOFT) 100 MG tablet; Take 1.5 tablets (150 mg total) by mouth daily.  Dispense: 135 tablet; Refill: 1 - cont to work to increase exercise, cont to improve diet - f/u PRN Discussed plan and reviewed medications with patient, including risks, benefits, and potential side effects. Pt expressed understand. All questions answered.    This visit occurred during the SARS-CoV-2 public health emergency.  Safety protocols were in place, including screening questions prior to the visit, additional usage of staff PPE, and extensive cleaning of exam room while observing appropriate contact time as indicated for disinfecting solutions.

## 2020-10-28 NOTE — Patient Instructions (Addendum)
[facility]: [email or web address] Call to make an appt!!!  [facility] [email or web address]  [email or web address]  [facility] [email or web address]  [email or web address]

## 2020-10-29 NOTE — Progress Notes (Signed)
Chief Complaint:   OBESITY Alison Davies is here to discuss her progress with her obesity treatment plan along with follow-up of her obesity related diagnoses. Alison Davies is on keeping a food journal and adhering to recommended goals of 1500 calories and 95 grams of protein daily and states she is following her eating plan approximately 20% of the time. Alison Davies states she is walking the dogs for 60 minutes 1 time per week.  Today's visit was #: 54 Starting weight: 199 lbs Starting date: 07/26/2017 Today's weight: 178 lbs Today's date: 10/27/2020 Total lbs lost to date: 21 Total lbs lost since last in-office visit: 0  Interim History: Alison Davies is eating out more often due to being in [place] school, working, and studying a lot. She is only eating 1-2 meals daily. She is not journaling.  Subjective:   1. Insulin resistance Alison Davies denies polyphagia while on Wegovy.  2. Vitamin D deficiency Alison Davies is on Vit D 2 times per week, and she is tolerating it well.  3. Hyperlipidemia, unspecified hyperlipidemia type Alison Davies is not on medications, and she is walking the dogs for exercise.  4. At risk for diabetes mellitus Alison Davies is at higher than average risk for developing diabetes due to obesity.   Assessment/Plan:   1. Insulin resistance Alison Davies will continue to work on weight loss, exercise, and decreasing simple carbohydrates to help decrease the risk of diabetes. We will check labs today. Arneisha agreed to follow-up with [name] as directed to closely monitor her progress.  - Comprehensive metabolic panel - Insulin, random  2. Vitamin D deficiency Low Vitamin D level contributes to fatigue and are associated with obesity, breast, and colon cancer. We will check labs today. Alison Davies agreed to continue taking prescription Vitamin D 50,000 IU twice weekly and will follow-up for routine testing of Vitamin D, at least 2-3 times per year to avoid over-replacement.  - VITAMIN D 25 Hydroxy (Vit-D Deficiency, Fractures)  3.  Hyperlipidemia, unspecified hyperlipidemia type Cardiovascular risk and specific lipid/LDL goals reviewed. We discussed several lifestyle modifications today. We will check labs today. Alison Davies will continue to work on diet, exercise and weight loss efforts. Orders and follow up as documented in patient record.   Counseling Intensive lifestyle modifications are the first line treatment for this issue. Dietary changes: Increase soluble fiber. Decrease simple carbohydrates. Exercise changes: Moderate to vigorous-intensity aerobic activity 150 minutes per week if tolerated. Lipid-lowering medications: see documented in medical record.  - Lipid panel  4. At risk for diabetes mellitus Alison Davies was given approximately 15 minutes of diabetes education and counseling today. We discussed intensive lifestyle modifications today with an emphasis on weight loss as well as increasing exercise and decreasing simple carbohydrates in her diet. We also reviewed medication options with an emphasis on risk versus benefit of those discussed.   Repetitive spaced learning was employed today to elicit superior memory formation and behavioral change.  5. Class 1 obesity with serious comorbidity and body mass index (BMI) of 30.0 to 30.9 in adult, unspecified obesity type Alison Davies is currently in the action stage of change. As such, her goal is to continue with weight loss efforts. She has agreed to the Category 3 Plan.   We discussed various medication options to help Alison Davies with her weight loss efforts and we both agreed to continue [place], and we will refill for 1 month.  - Semaglutide-Weight Management 1.7 MG/0.75ML SOAJ; INJECT 1.7 MG INTO THE SKIN ONCE A WEEK.  Dispense: 3 mL; Refill: 0  Exercise goals: As is.  Behavioral modification strategies: meal planning and cooking strategies and keeping healthy foods in the home.  Alison Davies has agreed to follow-up with our clinic in 3 weeks. She was informed of the importance of  frequent follow-up visits to maximize her success with intensive lifestyle modifications for her multiple health conditions.   Alison Davies was informed we would discuss her lab results at her next visit unless there is a critical issue that needs to be addressed sooner. Alison Davies agreed to keep her next visit at the agreed upon time to discuss these results.  Objective:   Blood pressure 107/77, pulse 88, temperature 98.2 F (36.8 C), height 5\' 4"  (1.626 m), weight 178 lb (80.7 kg), last menstrual period 10/17/2020, SpO2 99 %. Body mass index is 30.55 kg/m.  General: Cooperative, alert, well developed, in no acute distress. HEENT: Conjunctivae and lids unremarkable. Cardiovascular: Regular rhythm.  Lungs: Normal work of breathing. Neurologic: No focal deficits.   Lab Results  Component Value Date   CREATININE 0.65 10/27/2020   BUN 8 10/27/2020   NA 138 10/27/2020   K 4.1 10/27/2020   CL 100 10/27/2020   CO2 23 10/27/2020   Lab Results  Component Value Date   ALT 24 10/27/2020   AST 20 10/27/2020   ALKPHOS 69 10/27/2020   BILITOT 0.3 10/27/2020   Lab Results  Component Value Date   HGBA1C 4.9 03/24/2020   HGBA1C 4.8 10/23/2019   HGBA1C 4.8 06/13/2019   HGBA1C 5.0 11/13/2018   HGBA1C 4.9 04/19/2018   Lab Results  Component Value Date   INSULIN 12.6 10/27/2020   INSULIN 18.6 03/24/2020   INSULIN 11.1 10/23/2019   INSULIN 15.4 06/13/2019   INSULIN 14.4 11/13/2018   Lab Results  Component Value Date   TSH 1.850 07/26/2017   Lab Results  Component Value Date   CHOL 247 (H) 10/27/2020   HDL 62 10/27/2020   LDLCALC 166 (H) 10/27/2020   TRIG 108 10/27/2020   CHOLHDL 4.0 10/27/2020   Lab Results  Component Value Date   VD25OH 27.6 (L) 10/27/2020   VD25OH 21.1 (L) 03/24/2020   VD25OH 30.9 10/23/2019   Lab Results  Component Value Date   WBC 7.1 06/12/2019   HGB 14.0 06/12/2019   HCT 41.0 06/12/2019   MCV 89.7 06/12/2019   PLT 299.0 06/12/2019   No results found  for: IRON, TIBC, FERRITIN  Attestation Statements:   Reviewed by clinician on day of visit: allergies, medications, problem list, medical history, surgical history, family history, social history, and previous encounter notes.   08/10/2019, am acting as transcriptionist for Trude Mcburney, PA-C.  I have reviewed the above documentation for accuracy and completeness, and I agree with the above. [employer], PA-C

## 2020-11-16 ENCOUNTER — Ambulatory Visit (INDEPENDENT_AMBULATORY_CARE_PROVIDER_SITE_OTHER): Payer: No Typology Code available for payment source | Admitting: Physician Assistant

## 2020-11-30 ENCOUNTER — Other Ambulatory Visit: Payer: Self-pay

## 2020-11-30 ENCOUNTER — Ambulatory Visit (INDEPENDENT_AMBULATORY_CARE_PROVIDER_SITE_OTHER): Payer: No Typology Code available for payment source | Admitting: Physician Assistant

## 2020-11-30 ENCOUNTER — Encounter (INDEPENDENT_AMBULATORY_CARE_PROVIDER_SITE_OTHER): Payer: Self-pay | Admitting: Physician Assistant

## 2020-11-30 ENCOUNTER — Other Ambulatory Visit (HOSPITAL_BASED_OUTPATIENT_CLINIC_OR_DEPARTMENT_OTHER): Payer: Self-pay

## 2020-11-30 VITALS — BP 110/71 | HR 92 | Temp 98.7°F | Ht 64.0 in | Wt 178.0 lb

## 2020-11-30 DIAGNOSIS — E669 Obesity, unspecified: Secondary | ICD-10-CM | POA: Diagnosis not present

## 2020-11-30 DIAGNOSIS — E559 Vitamin D deficiency, unspecified: Secondary | ICD-10-CM | POA: Diagnosis not present

## 2020-11-30 DIAGNOSIS — Z6830 Body mass index (BMI) 30.0-30.9, adult: Secondary | ICD-10-CM

## 2020-11-30 DIAGNOSIS — Z9189 Other specified personal risk factors, not elsewhere classified: Secondary | ICD-10-CM | POA: Diagnosis not present

## 2020-11-30 MED ORDER — SEMAGLUTIDE-WEIGHT MANAGEMENT 1.7 MG/0.75ML ~~LOC~~ SOAJ
1.7000 mg | SUBCUTANEOUS | 0 refills | Status: DC
  Filled 2020-11-30: qty 3, 28d supply, fill #0

## 2020-11-30 MED ORDER — VITAMIN D (ERGOCALCIFEROL) 1.25 MG (50000 UNIT) PO CAPS
ORAL_CAPSULE | ORAL | 0 refills | Status: DC
  Filled 2020-11-30: qty 10, 30d supply, fill #0

## 2020-12-01 NOTE — Progress Notes (Signed)
Chief Complaint:   OBESITY Chestine is here to discuss her progress with her obesity treatment plan along with follow-up of her obesity related diagnoses. Marisa is on the Category 3 Plan and states she is following her eating plan approximately 40% of the time. Gladine states she is hiking and walking 0 minutes 0 times per week.  Today's visit was #: 55 Starting weight: 199 lbs Starting date: 07/26/2017 Today's weight: 178 lbs Today's date: 11/30/2020 Total lbs lost to date: 21 lbs Total lbs lost since last in-office visit: 0  Interim History: Media just finished classes and she is going to be studying for [place] certification. Her grandmother was in town the last few weeks and she feels that she didn't go "completely off the rails" with the plan.  Subjective:   1. Vitamin D deficiency Toini is on Vitamin D weekly but has missed numerous doses. We discussed labs today.   2. At risk for osteoporosis Shameika is at risk for osteoporosis due to Vitamin D deficiency.  Assessment/Plan:   1. Vitamin D deficiency Low Vitamin D level contributes to fatigue and are associated with obesity, breast, and colon cancer. We will refill Vitamin D with no refills. Vermell agrees to continue to take prescription Vitamin D 50,000 IU.  She will follow-up for routine testing of Vitamin D, at least 2-3 times per year to avoid over-replacement.  - Vitamin D, Ergocalciferol, (DRISDOL) 1.25 MG (50000 UNIT) CAPS capsule; TAKE 1 CAPSULE (50,000 UNITS TOTAL) BY MOUTH 2 (TWO) TIMES A WEEK.  Dispense: 10 capsule; Refill: 0  2. At risk for osteoporosis Naesha was given approximately 15 minutes of osteoporosis prevention counseling today. Keira is at risk for osteopenia and osteoporosis due to her Vitamin D deficiency. She was encouraged to take her Vitamin D and follow her higher calcium diet and increase strengthening exercise to help strengthen her bones and decrease her risk of osteopenia and osteoporosis.  Repetitive  spaced learning was employed today to elicit superior memory formation and behavioral change.   3. Class 1 obesity with serious comorbidity and body mass index (BMI) of 30.0 to 30.9 in adult, unspecified obesity type, current bmi 30.54  - Semaglutide-Weight Management 1.7 MG/0.75ML SOAJ; INJECT 1.7 MG INTO THE SKIN ONCE A WEEK.  Dispense: 3 mL; Refill: 0  Levina is currently in the action stage of change. As such, her goal is to continue with weight loss efforts. She has agreed to the Category 3 Plan.   We will refill Angelis's Wegovy 1.7 mg for 1 month with no refills.  Exercise goals: No exercise has been prescribed at this time.  Behavioral modification strategies: meal planning and cooking strategies and keeping healthy foods in the home.  Emmajean has agreed to follow-up with our clinic in 3 weeks. She was informed of the importance of frequent follow-up visits to maximize her success with intensive lifestyle modifications for her multiple health conditions.   Objective:   Blood pressure 110/71, pulse 92, temperature 98.7 F (37.1 C), height 5\' 4"  (1.626 m), weight 178 lb (80.7 kg), SpO2 98 %. Body mass index is 30.55 kg/m.  General: Cooperative, alert, well developed, in no acute distress. HEENT: Conjunctivae and lids unremarkable. Cardiovascular: Regular rhythm.  Lungs: Normal work of breathing. Neurologic: No focal deficits.   Lab Results  Component Value Date   CREATININE 0.65 10/27/2020   BUN 8 10/27/2020   NA 138 10/27/2020   K 4.1 10/27/2020   CL 100 10/27/2020   CO2  23 10/27/2020   Lab Results  Component Value Date   ALT 24 10/27/2020   AST 20 10/27/2020   ALKPHOS 69 10/27/2020   BILITOT 0.3 10/27/2020   Lab Results  Component Value Date   HGBA1C 4.9 03/24/2020   HGBA1C 4.8 10/23/2019   HGBA1C 4.8 06/13/2019   HGBA1C 5.0 11/13/2018   HGBA1C 4.9 04/19/2018   Lab Results  Component Value Date   INSULIN 12.6 10/27/2020   INSULIN 18.6 03/24/2020   INSULIN  11.1 10/23/2019   INSULIN 15.4 06/13/2019   INSULIN 14.4 11/13/2018   Lab Results  Component Value Date   TSH 1.850 07/26/2017   Lab Results  Component Value Date   CHOL 247 (H) 10/27/2020   HDL 62 10/27/2020   LDLCALC 166 (H) 10/27/2020   TRIG 108 10/27/2020   CHOLHDL 4.0 10/27/2020   Lab Results  Component Value Date   VD25OH 27.6 (L) 10/27/2020   VD25OH 21.1 (L) 03/24/2020   VD25OH 30.9 10/23/2019   Lab Results  Component Value Date   WBC 7.1 06/12/2019   HGB 14.0 06/12/2019   HCT 41.0 06/12/2019   MCV 89.7 06/12/2019   PLT 299.0 06/12/2019   No results found for: IRON, TIBC, FERRITIN  Attestation Statements:   Reviewed by clinician on day of visit: allergies, medications, problem list, medical history, surgical history, family history, social history, and previous encounter notes.  I, Sindy Messing, am acting as Energy manager for [employer], PA-C.   I have reviewed the above documentation for accuracy and completeness, and I agree with the above. [name], PA-C

## 2020-12-08 ENCOUNTER — Other Ambulatory Visit (HOSPITAL_BASED_OUTPATIENT_CLINIC_OR_DEPARTMENT_OTHER): Payer: Self-pay

## 2020-12-08 ENCOUNTER — Ambulatory Visit
Admission: RE | Admit: 2020-12-08 | Discharge: 2020-12-08 | Disposition: A | Discharge status: Home / Self Care | Payer: No Typology Code available for payment source | Source: Ambulatory Visit | Attending: Emergency Medicine | Admitting: Emergency Medicine

## 2020-12-08 ENCOUNTER — Other Ambulatory Visit: Payer: Self-pay

## 2020-12-08 VITALS — BP 123/88 | HR 82 | Temp 98.2°F | Resp 18

## 2020-12-08 DIAGNOSIS — J069 Acute upper respiratory infection, unspecified: Secondary | ICD-10-CM | POA: Diagnosis not present

## 2020-12-08 LAB — POCT RAPID STREP A (OFFICE): Rapid Strep A Screen: NEGATIVE

## 2020-12-08 MED ORDER — AMOXICILLIN-POT CLAVULANATE 875-125 MG PO TABS
1.0000 | ORAL_TABLET | Freq: Two times a day (BID) | ORAL | 0 refills | Status: DC
  Filled 2020-12-08: qty 14, 7d supply, fill #0

## 2020-12-08 MED ORDER — FLUTICASONE PROPIONATE 50 MCG/ACT NA SUSP
1.0000 | Freq: Every day | NASAL | 0 refills | Status: DC
  Filled 2020-12-08: qty 16, 30d supply, fill #0

## 2020-12-08 MED ORDER — BENZONATATE 200 MG PO CAPS
200.0000 mg | ORAL_CAPSULE | Freq: Three times a day (TID) | ORAL | 0 refills | Status: AC | PRN
  Filled 2020-12-08: qty 28, 10d supply, fill #0

## 2020-12-08 NOTE — ED Triage Notes (Signed)
Patient c/o sore throat and productive cough w/ "green" sputum x 4 days.   Patient denies fever.   Patient took an at home COVID test with negative test results.   Patient denies headache or ear pain.   Patient endorses sinus pressure.   Patient has used Mucinex with no relief of symptoms.

## 2020-12-08 NOTE — ED Provider Notes (Signed)
[facility]    CSN: [id number] Arrival date & time: 12/08/20  0820      History   Chief Complaint Chief Complaint  Patient presents with   Sore Throat   Cough    HPI SHERAH LUND is a 35 y.o. female presenting today for evaluation of URI symptoms.  Reports associated sore throat and congestion.  Reports 4 to 5 days of cough and congestion.  Reports thick green mucus production throughout the day.  A lot of sinus discomfort and pressure.  Denies fevers.  At home COVID test negative.  Using Mucinex without full relief.  HPI  Past Medical History:  Diagnosis Date   Anxiety    Hyperlipidemia    Leg edema    Migraines    Vaginal Pap smear, abnormal 06/12/2019   LGSIL +HPV    Patient Active Problem List   Diagnosis Date Noted   Left knee pain 04/08/2019   Pain of right heel 01/16/2018   Low back pain 12/07/2017   HLD (hyperlipidemia) 10/05/2017   GAD (generalized anxiety disorder) 10/05/2017   Migraine headache 11/16/2012    Past Surgical History:  Procedure Laterality Date   COLPOSCOPY N/A     OB History     Gravida  0   Para  0   Term  0   Preterm  0   AB  0   Living  0      SAB  0   IAB  0   Ectopic  0   Multiple  0   Live Births  0            Home Medications    Prior to Admission medications   Medication Sig Start Date End Date Taking? Authorizing Provider  amoxicillin-clavulanate (AUGMENTIN) 875-125 MG tablet Take 1 tablet by mouth every 12 (twelve) hours. 12/08/20  Yes Sydnei Ohaver C, PA-C  benzonatate (TESSALON) 200 MG capsule Take 1 capsule (200 mg total) by mouth 3 (three) times daily as needed for up to 7 days for cough. 12/08/20 12/18/20 Yes Jayquan Bradsher C, PA-C  fluticasone (FLONASE) 50 MCG/ACT nasal spray Place 1-2 sprays into both nostrils daily. 12/08/20  Yes Najeh Credit C, PA-C  norethindrone (MICRONOR) 0.35 MG tablet TAKE 1 TABLET BY MOUTH ONCE DAILY 10/09/20 10/09/21 Yes Harraway-Smith, Eber Jones, MD   sertraline (ZOLOFT) 100 MG tablet Take 1&1/2 tablets (150 mg total) by mouth daily. 10/28/20  Yes Cirigliano, Mary K, DO  topiramate (TOPAMAX) 100 MG tablet Take 1 tablet (100 mg total) by mouth at bedtime. **Needs appt before anymore refills given** 09/29/20  Yes Cirigliano, Jearld Lesch, DO  Biotin 3 MG TABS Take 2 tablets by mouth daily.    [provider]  Cyanocobalamin (VITAMIN B-12 CR) 1500 MCG TBCR Take 2 tablets by mouth 2 (two) times daily.    [provider]  Melatonin 5 MG CHEW Chew 2 capsules by mouth at bedtime as needed.    [provider]  Multiple Vitamin (MULTIVITAMIN WITH MINERALS) TABS tablet Take 1 tablet by mouth daily.    [provider]  Semaglutide-Weight Management 1.7 MG/0.75ML SOAJ INJECT 1.7 MG INTO THE SKIN ONCE A WEEK. 11/30/20 11/30/21  [name], PA-C  SUMAtriptan (IMITREX) 25 MG tablet Take 1 tablet (25 mg total) by mouth every 2 (two) hours as needed for migraine. May repeat in 2 hours if headache persists or recurs. 10/05/17   Dianne Dun, MD  Turmeric (QC TUMERIC COMPLEX PO) Take by mouth.  [provider]  vitamin C (ASCORBIC ACID) 250 MG tablet Take 250 mg by mouth daily.    [provider]  Vitamin D, Ergocalciferol, (DRISDOL) 1.25 MG (50000 UNIT) CAPS capsule TAKE 1 CAPSULE (50,000 UNITS TOTAL) BY MOUTH 2 (TWO) TIMES A WEEK. 11/30/20 11/30/21  [name], PA-C    Family History Family History  Problem Relation Age of Onset   Anxiety disorder Mother    Diabetes Father    Obesity Father     Social History Social History   Tobacco Use   Smoking status: Never   Smokeless tobacco: Never  Vaping Use   Vaping Use: Never used  Substance Use Topics   Alcohol use: Yes    Comment: occasional   Drug use: Never     Allergies   Patient has no known allergies.   Review of Systems Review of Systems  Constitutional:  Negative for activity change, appetite change, chills, fatigue and fever.  HENT:   Positive for congestion and sore throat. Negative for ear pain, rhinorrhea, sinus pressure and trouble swallowing.   Eyes:  Negative for discharge and redness.  Respiratory:  Positive for cough. Negative for chest tightness and shortness of breath.   Cardiovascular:  Negative for chest pain.  Gastrointestinal:  Negative for abdominal pain, diarrhea, nausea and vomiting.  Musculoskeletal:  Negative for myalgias.  Skin:  Negative for rash.  Neurological:  Negative for dizziness, light-headedness and headaches.    Physical Exam Triage Vital Signs ED Triage Vitals  Enc Vitals Group     BP      Pulse      Resp      Temp      Temp src      SpO2      Weight      Height      Head Circumference      Peak Flow      Pain Score      Pain Loc      Pain Edu?      Excl. in GC?    No data found.  Updated Vital Signs BP 123/88 (BP Location: Left Arm)   Pulse 82   Temp 98.2 F (36.8 C) (Oral)   Resp 18   LMP 11/03/2020 (Approximate)   SpO2 98%   Visual Acuity Right Eye Distance:   Left Eye Distance:   Bilateral Distance:    Right Eye Near:   Left Eye Near:    Bilateral Near:     Physical Exam Vitals and nursing note reviewed.  Constitutional:      Appearance: She is well-developed.     Comments: No acute distress  HENT:     Head: Normocephalic and atraumatic.     Ears:     Comments: Bilateral ears without tenderness to palpation of external auricle, tragus and mastoid, EAC's without erythema or swelling, TM's with good bony landmarks and cone of light. Non erythematous.      Nose: Nose normal.     Mouth/Throat:     Comments: Oral mucosa pink and moist, no tonsillar enlargement or exudate. Posterior pharynx patent and nonerythematous, no uvula deviation or swelling. Normal phonation.  Eyes:     Conjunctiva/sclera: Conjunctivae normal.  Cardiovascular:     Rate and Rhythm: Normal rate.  Pulmonary:     Effort: Pulmonary effort is normal. No respiratory distress.      Comments: Breathing comfortably at rest, CTABL, no wheezing, rales or other adventitious sounds auscultated  Thick green  mucus noted with cough Abdominal:     General: There is no distension.  Musculoskeletal:        General: Normal range of motion.     Cervical back: Neck supple.  Skin:    General: Skin is warm and dry.  Neurological:     Mental Status: She is alert and oriented to person, place, and time.     UC Treatments / Results  Labs (all labs ordered are listed, but only abnormal results are displayed) Labs Reviewed  CULTURE, GROUP A STREP ([facility])  NOVEL CORONAVIRUS, NAA  POCT RAPID STREP A (OFFICE)    EKG   Radiology No results found.  Procedures Procedures (including critical care time)  Medications Ordered in UC Medications - No data to display  Initial Impression / Assessment and Plan / UC Course  I have reviewed the triage vital signs and the nursing notes.  Pertinent labs & imaging results that were available during my care of the patient were reviewed by me and considered in my medical decision making (see chart for details).     URI symptoms x4 to 5 days, strep test negative, COVID test pending, discussed with patient typical course of URI symptoms, but given discolored mucus and patient's reported symptoms we will go ahead and cover with Augmentin for sinusitis, continue symptomatic and supportive care, recommendations provided.  Discussed strict return precautions. Patient verbalized understanding and is agreeable with plan.  Final Clinical Impressions(s) / UC Diagnoses   Final diagnoses:  URI with cough and congestion     Discharge Instructions      Augmentin twice daily x1 week to cover sinus infection Continue Mucinex Tessalon every 8 hours for cough Flonase nasal spray 1 to 2 spray in each nostril daily Drink plenty of fluids Tylenol and ibuprofen as needed Follow-up if not improving or worsening     ED Prescriptions      Medication Sig Dispense Auth. Provider   amoxicillin-clavulanate (AUGMENTIN) 875-125 MG tablet Take 1 tablet by mouth every 12 (twelve) hours. 14 tablet Keoshia Steinmetz C, PA-C   benzonatate (TESSALON) 200 MG capsule Take 1 capsule (200 mg total) by mouth 3 (three) times daily as needed for up to 7 days for cough. 28 capsule Haidar Muse C, PA-C   fluticasone (FLONASE) 50 MCG/ACT nasal spray Place 1-2 sprays into both nostrils daily. 16 g Schwanda Zima, Sangrey C, PA-C      PDMP not reviewed this encounter.   Jamire Shabazz, Ben Bolt C, PA-C 12/08/20 1001

## 2020-12-08 NOTE — Discharge Instructions (Addendum)
Augmentin twice daily x1 week to cover sinus infection Continue Mucinex Tessalon every 8 hours for cough Flonase nasal spray 1 to 2 spray in each nostril daily Drink plenty of fluids Tylenol and ibuprofen as needed Follow-up if not improving or worsening

## 2020-12-09 ENCOUNTER — Encounter (INDEPENDENT_AMBULATORY_CARE_PROVIDER_SITE_OTHER): Payer: Self-pay

## 2020-12-09 LAB — SARS-COV-2, NAA 2 DAY TAT

## 2020-12-09 LAB — NOVEL CORONAVIRUS, NAA: SARS-CoV-2, NAA: NOT DETECTED

## 2020-12-10 LAB — CULTURE, GROUP A STREP (THRC)

## 2020-12-21 ENCOUNTER — Ambulatory Visit (INDEPENDENT_AMBULATORY_CARE_PROVIDER_SITE_OTHER): Payer: No Typology Code available for payment source | Admitting: Physician Assistant

## 2020-12-21 ENCOUNTER — Ambulatory Visit
Admission: RE | Admit: 2020-12-21 | Discharge: 2020-12-21 | Disposition: A | Discharge status: Home / Self Care | Payer: No Typology Code available for payment source | Source: Ambulatory Visit | Attending: Emergency Medicine | Admitting: Emergency Medicine

## 2020-12-21 ENCOUNTER — Other Ambulatory Visit (HOSPITAL_BASED_OUTPATIENT_CLINIC_OR_DEPARTMENT_OTHER): Payer: Self-pay

## 2020-12-21 ENCOUNTER — Other Ambulatory Visit: Payer: Self-pay

## 2020-12-21 VITALS — BP 142/100 | HR 94 | Temp 99.1°F | Resp 18

## 2020-12-21 DIAGNOSIS — J019 Acute sinusitis, unspecified: Secondary | ICD-10-CM | POA: Diagnosis not present

## 2020-12-21 DIAGNOSIS — H1031 Unspecified acute conjunctivitis, right eye: Secondary | ICD-10-CM

## 2020-12-21 MED ORDER — CIPROFLOXACIN HCL 0.3 % OP SOLN
1.0000 [drp] | OPHTHALMIC | 0 refills | Status: DC
  Filled 2020-12-21: qty 5, 9d supply, fill #0

## 2020-12-21 MED ORDER — DOXYCYCLINE HYCLATE 100 MG PO CAPS
100.0000 mg | ORAL_CAPSULE | Freq: Two times a day (BID) | ORAL | 0 refills | Status: AC
Start: 2020-12-21 — End: 2020-12-31
  Filled 2020-12-21: qty 20, 10d supply, fill #0

## 2020-12-21 MED ORDER — GUAIFENESIN-CODEINE 100-10 MG/5ML PO SOLN
5.0000 mL | Freq: Every evening | ORAL | 0 refills | Status: DC | PRN
  Filled 2020-12-21: qty 120, 12d supply, fill #0

## 2020-12-21 NOTE — ED Provider Notes (Signed)
[facility]    CSN: [id number] Arrival date & time: 12/21/20  1403      History   Chief Complaint Chief Complaint  Patient presents with   Eye Drainage   Nasal Congestion    HPI FRANK PILGER is a 35 y.o. female history of migraines presenting today for evaluation of nasal congestion and right eye redness and drainage.  Patient was seen here approximately 2 weeks ago and treated for sinusitis at the time with Augmentin, completed antibiotics, but over the past week patient has had continued purulent drainage from nose reports thick discolored mucus along with now in the past 1 to 2 days has developed right eye redness and drainage.  Does wear contacts, but is not wearing at presentation.  She continues to have cough and congestion, but denies any chest pain or shortness of breath.  Denies any recent fevers.  HPI  Past Medical History:  Diagnosis Date   Anxiety    Hyperlipidemia    Leg edema    Migraines    Vaginal Pap smear, abnormal 06/12/2019   LGSIL +HPV    Patient Active Problem List   Diagnosis Date Noted   Left knee pain 04/08/2019   Pain of right heel 01/16/2018   Low back pain 12/07/2017   HLD (hyperlipidemia) 10/05/2017   GAD (generalized anxiety disorder) 10/05/2017   Migraine headache 11/16/2012    Past Surgical History:  Procedure Laterality Date   COLPOSCOPY N/A     OB History     Gravida  0   Para  0   Term  0   Preterm  0   AB  0   Living  0      SAB  0   IAB  0   Ectopic  0   Multiple  0   Live Births  0            Home Medications    Prior to Admission medications   Medication Sig Start Date End Date Taking? Authorizing Provider  ciprofloxacin (CILOXAN) 0.3 % ophthalmic solution Place 1 drop into the right eye every 2 hours, while awake, for 2 days. Then 1 drop every 4 hours, while awake, for the next 5 days. 12/21/20  Yes Marinell Igarashi C, PA-C  doxycycline (VIBRAMYCIN) 100 MG capsule Take 1 capsule (100  mg total) by mouth 2 (two) times daily for 10 days. 12/21/20 12/31/20 Yes Donnavin Vandenbrink C, PA-C  guaiFENesin-codeine 100-10 MG/5ML syrup Take 5-10 mLs by mouth at bedtime as needed for cough. 12/21/20  Yes Adolf Ormiston C, PA-C  Biotin 3 MG TABS Take 2 tablets by mouth daily.    [provider]  Cyanocobalamin (VITAMIN B-12 CR) 1500 MCG TBCR Take 2 tablets by mouth 2 (two) times daily.    [provider]  fluticasone (FLONASE) 50 MCG/ACT nasal spray Place 1-2 sprays into both nostrils daily. 12/08/20   Helaman Mecca C, PA-C  Melatonin 5 MG CHEW Chew 2 capsules by mouth at bedtime as needed.    [provider]  Multiple Vitamin (MULTIVITAMIN WITH MINERALS) TABS tablet Take 1 tablet by mouth daily.    [provider]  norethindrone (MICRONOR) 0.35 MG tablet TAKE 1 TABLET BY MOUTH ONCE DAILY 10/09/20 10/09/21  Willodean Rosenthal, MD  Semaglutide-Weight Management 1.7 MG/0.75ML SOAJ INJECT 1.7 MG INTO THE SKIN ONCE A WEEK. 11/30/20 11/30/21  [name], PA-C  sertraline (ZOLOFT) 100 MG tablet Take 1&1/2 tablets (150 mg total) by mouth daily.  10/28/20   Cirigliano, Jearld Lesch, DO  SUMAtriptan (IMITREX) 25 MG tablet Take 1 tablet (25 mg total) by mouth every 2 (two) hours as needed for migraine. May repeat in 2 hours if headache persists or recurs. 10/05/17   Dianne Dun, MD  topiramate (TOPAMAX) 100 MG tablet Take 1 tablet (100 mg total) by mouth at bedtime. **Needs appt before anymore refills given** 09/29/20   Cirigliano, Jearld Lesch, DO  Turmeric (QC TUMERIC COMPLEX PO) Take by mouth.    [provider]  vitamin C (ASCORBIC ACID) 250 MG tablet Take 250 mg by mouth daily.    [provider]  Vitamin D, Ergocalciferol, (DRISDOL) 1.25 MG (50000 UNIT) CAPS capsule TAKE 1 CAPSULE (50,000 UNITS TOTAL) BY MOUTH 2 (TWO) TIMES A WEEK. 11/30/20 11/30/21  [name], PA-C    Family History Family History  Problem Relation Age of Onset   Anxiety disorder Mother     Diabetes Father    Obesity Father     Social History Social History   Tobacco Use   Smoking status: Never   Smokeless tobacco: Never  Vaping Use   Vaping Use: Never used  Substance Use Topics   Alcohol use: Yes    Comment: occasional   Drug use: Never     Allergies   Patient has no known allergies.   Review of Systems Review of Systems  Constitutional:  Negative for activity change, appetite change, chills, fatigue and fever.  HENT:  Positive for congestion and rhinorrhea. Negative for ear pain, sinus pressure, sore throat and trouble swallowing.   Eyes:  Positive for discharge and redness.  Respiratory:  Positive for cough. Negative for chest tightness and shortness of breath.   Cardiovascular:  Negative for chest pain.  Gastrointestinal:  Negative for abdominal pain, diarrhea, nausea and vomiting.  Musculoskeletal:  Negative for myalgias.  Skin:  Negative for rash.  Neurological:  Negative for dizziness, light-headedness and headaches.    Physical Exam Triage Vital Signs ED Triage Vitals  Enc Vitals Group     BP 12/21/20 1425 (!) 142/100     Pulse Rate 12/21/20 1425 94     Resp 12/21/20 1425 18     Temp 12/21/20 1425 99.1 F (37.3 C)     Temp Source 12/21/20 1425 Oral     SpO2 12/21/20 1425 97 %     Weight --      Height --      Head Circumference --      Peak Flow --      Pain Score 12/21/20 1428 5     Pain Loc --      Pain Edu? --      Excl. in GC? --    No data found.  Updated Vital Signs BP (!) 142/100 (BP Location: Left Arm)   Pulse 94   Temp 99.1 F (37.3 C) (Oral)   Resp 18   SpO2 97%   Visual Acuity Right Eye Distance:   Left Eye Distance:   Bilateral Distance:    Right Eye Near:   Left Eye Near:    Bilateral Near:     Physical Exam Vitals and nursing note reviewed.  Constitutional:      Appearance: She is well-developed.     Comments: No acute distress  HENT:     Head: Normocephalic and atraumatic.     Ears:      Comments: Bilateral ears without tenderness to palpation of external auricle, tragus and mastoid, EAC's  without erythema or swelling, TM's with good bony landmarks and cone of light. Non erythematous.      Nose: Nose normal.     Mouth/Throat:     Comments: Oral mucosa pink and moist, no tonsillar enlargement or exudate. Posterior pharynx patent and nonerythematous, no uvula deviation or swelling. Normal phonation.  Eyes:     Comments: Right eye with diffuse conjunctival erythema, limbus clear, anterior chamber clear, no photophobia with exam  Cardiovascular:     Rate and Rhythm: Normal rate.  Pulmonary:     Effort: Pulmonary effort is normal. No respiratory distress.  Abdominal:     General: There is no distension.  Musculoskeletal:        General: Normal range of motion.     Cervical back: Neck supple.  Skin:    General: Skin is warm and dry.  Neurological:     Mental Status: She is alert and oriented to person, place, and time.     UC Treatments / Results  Labs (all labs ordered are listed, but only abnormal results are displayed) Labs Reviewed - No data to display  EKG   Radiology No results found.  Procedures Procedures (including critical care time)  Medications Ordered in UC Medications - No data to display  Initial Impression / Assessment and Plan / UC Course  I have reviewed the triage vital signs and the nursing notes.  Pertinent labs & imaging results that were available during my care of the patient were reviewed by me and considered in my medical decision making (see chart for details).     Treating for sinusitis/conjunctivitis-Doxy as alternative to Augmentin, Cipro eyedrops, Robitussin with codeine for cough at bedtime and continue with OTC meds for cough/congestion during the day.  Rest and fluids.  Discussed strict return precautions. Patient verbalized understanding and is agreeable with plan.  Final Clinical Impressions(s) / UC Diagnoses    Final diagnoses:  Acute sinusitis with symptoms > 10 days  Acute bacterial conjunctivitis of right eye     Discharge Instructions      Please begin Cipro eyedrops as prescribed Doxycycline twice daily for 10 days Robitussin with codeine at bedtime Continue with over-the-counter medicine for congestion Drink plenty of fluids Follow-up if not improving or worsening     ED Prescriptions     Medication Sig Dispense Auth. Provider   ciprofloxacin (CILOXAN) 0.3 % ophthalmic solution Place 1 drop into the right eye every 2 hours, while awake, for 2 days. Then 1 drop every 4 hours, while awake, for the next 5 days. 5 mL Vicente Weidler C, PA-C   doxycycline (VIBRAMYCIN) 100 MG capsule Take 1 capsule (100 mg total) by mouth 2 (two) times daily for 10 days. 20 capsule Levora Werden C, PA-C   guaiFENesin-codeine 100-10 MG/5ML syrup Take 5-10 mLs by mouth at bedtime as needed for cough. 120 mL Vontae Court, [place] C, PA-C      PDMP not reviewed this encounter.   Lew Dawes, [place] 12/21/20 1558

## 2020-12-21 NOTE — Discharge Instructions (Addendum)
Please begin Cipro eyedrops as prescribed Doxycycline twice daily for 10 days Robitussin with codeine at bedtime Continue with over-the-counter medicine for congestion Drink plenty of fluids Follow-up if not improving or worsening

## 2020-12-21 NOTE — ED Triage Notes (Signed)
Patient presents to [facility] for evaluation on continued sore throat and nasal drainage, turning yellow Thursday of last week.  States she also woke up with a sore neck and headache and is concerned for meningitis, or "my frontal sinuses getting infected".  Also had right eye redness yesterday, today woke up with drainage from both eyes, blurry vision (not wearing contacts at this time because of it)

## 2020-12-22 ENCOUNTER — Other Ambulatory Visit (HOSPITAL_BASED_OUTPATIENT_CLINIC_OR_DEPARTMENT_OTHER): Payer: Self-pay

## 2020-12-30 ENCOUNTER — Other Ambulatory Visit (HOSPITAL_COMMUNITY)
Admission: RE | Admit: 2020-12-30 | Discharge: 2020-12-30 | Disposition: A | Discharge status: Home / Self Care | Payer: No Typology Code available for payment source | Source: Ambulatory Visit | Attending: Obstetrics & Gynecology | Admitting: Obstetrics & Gynecology

## 2020-12-30 ENCOUNTER — Ambulatory Visit: Payer: No Typology Code available for payment source | Admitting: Family Medicine

## 2020-12-30 ENCOUNTER — Other Ambulatory Visit: Payer: Self-pay

## 2020-12-30 ENCOUNTER — Encounter: Payer: Self-pay | Admitting: Family Medicine

## 2020-12-30 ENCOUNTER — Encounter: Payer: Self-pay | Admitting: General Practice

## 2020-12-30 VITALS — BP 119/76 | HR 84 | Wt 182.0 lb

## 2020-12-30 DIAGNOSIS — R87612 Low grade squamous intraepithelial lesion on cytologic smear of cervix (LGSIL): Secondary | ICD-10-CM | POA: Diagnosis not present

## 2020-12-30 NOTE — Progress Notes (Signed)
Patient Name: Alison Davies, female   DOB: 01/25/1986, 35 y.o.  MRN: [id number]  Colposcopy Procedure Note:  G0P0000 Pregnancy status: Unknown Lab Results  Component Value Date   DIAGPAP - Low grade squamous intraepithelial lesion (LSIL) (A) 10/09/2020   DIAGPAP - Low grade squamous intraepithelial lesion (LSIL) (A) 06/12/2019   HPVHIGH Positive (A) 10/09/2020   HPVHIGH Positive (A) 06/12/2019    Cervical History: Previous Colposcopy: 2021 - CIN1 Previous LEEP or Cryo: none  Smoking: Never Smoked Hysterectomy: No Other History:   Patient given informed consent, signed copy in the chart, time out was performed.    Exam: Vulva and Vagina grossly normal.  Cervix viewed with speculum and colposcope after application of acetic acid:  Cervix Fully Visualized Squamocolumnar Junction Visibility: Fully visualized  Acetowhite lesions: 5 and 12 o'clock  Other Lesions: None Punctation: Not present  Mosaicism: Not present Abnormal vasculature: No   Biopsies: 5 and 12 o'clock ECC: Yes - Curette and Brush  Hemostasis achieved with:  Monsel's Solution  Colposcopy Impression:  CIN1   Patient was given post procedure instructions.  Will call patient with results.

## 2020-12-31 HISTORY — PX: CRYOTHERAPY: SHX1416

## 2021-01-01 LAB — SURGICAL PATHOLOGY

## 2021-01-05 ENCOUNTER — Other Ambulatory Visit: Payer: No Typology Code available for payment source

## 2021-01-08 ENCOUNTER — Telehealth: Payer: Self-pay | Admitting: *Deleted

## 2021-01-08 NOTE — Telephone Encounter (Signed)
Left patient a message to call and schedule Cryo per Dr. Adrian Blackwater, [facility] patient. Please schedule with Dr. Para March for Thursday, 01/28/2021 or after.

## 2021-01-18 ENCOUNTER — Other Ambulatory Visit: Payer: Self-pay

## 2021-01-18 ENCOUNTER — Ambulatory Visit (INDEPENDENT_AMBULATORY_CARE_PROVIDER_SITE_OTHER): Payer: No Typology Code available for payment source | Admitting: Family Medicine

## 2021-01-18 ENCOUNTER — Encounter: Payer: Self-pay | Admitting: Family Medicine

## 2021-01-18 ENCOUNTER — Ambulatory Visit: Payer: No Typology Code available for payment source | Attending: Internal Medicine

## 2021-01-18 ENCOUNTER — Other Ambulatory Visit (HOSPITAL_BASED_OUTPATIENT_CLINIC_OR_DEPARTMENT_OTHER): Payer: Self-pay

## 2021-01-18 DIAGNOSIS — E669 Obesity, unspecified: Secondary | ICD-10-CM | POA: Diagnosis not present

## 2021-01-18 DIAGNOSIS — F411 Generalized anxiety disorder: Secondary | ICD-10-CM | POA: Diagnosis not present

## 2021-01-18 DIAGNOSIS — G43709 Chronic migraine without aura, not intractable, without status migrainosus: Secondary | ICD-10-CM | POA: Diagnosis not present

## 2021-01-18 MED ORDER — TOPIRAMATE 100 MG PO TABS
100.0000 mg | ORAL_TABLET | Freq: Every day | ORAL | 1 refills | Status: DC
  Filled 2021-01-18: qty 90, 90d supply, fill #0

## 2021-01-18 NOTE — Assessment & Plan Note (Signed)
Stable with sertraline at current strength.

## 2021-01-18 NOTE — Assessment & Plan Note (Signed)
Currently working with healthy weight and wellness.  She has done well with [facility] so far.  There is questionable history of PCOS previously.

## 2021-01-18 NOTE — Progress Notes (Signed)
Alison Davies - 35 y.o. female MRN [id number]  Date of birth: [birth year]  Subjective Chief Complaint  Patient presents with   Establish Care   Shoulder Pain   Elbow Pain    HPI Alison Davies is a 35 year old female here today for visit to establish care.  She has history of hyperlipidemia, generalized anxiety and migraines.    She has been told in the past that she has PCOS.  She has had difficulty with weight management and is currently seeing healthy weight and wellness.  She is treated with [facility], currently at 1.7 mg dose.  Tolerating this well so far.  She has had fairly good results with this.  Anxiety is well managed with sertraline.  No significant side effects related to this.  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  She does need renewal on topiramate.  This is worked well for management of her migraines.  She does have Imitrex as well as needed.  ROS:  A comprehensive ROS was completed and negative except as noted per HPI  No Known Allergies  Past Medical History:  Diagnosis Date   Anxiety    Hyperlipidemia    Leg edema    Migraines    Vaginal Pap smear, abnormal 06/12/2019   LGSIL +HPV    Past Surgical History:  Procedure Laterality Date   COLPOSCOPY N/A     Social History   Socioeconomic History   Marital status: Single    Spouse name: Not on file   Number of children: Not on file   Years of education: Not on file   Highest education level: Not on file  Occupational History   Occupation: [employer] manager: [employer]  Tobacco Use   Smoking status: Never   Smokeless tobacco: Never  Vaping Use   Vaping Use: Never used  Substance and Sexual Activity   Alcohol use: Yes    Comment: occasional   Drug use: Never   Sexual activity: Yes    Birth control/protection: Pill  Other Topics Concern   Not on file  Social History Narrative   Not on file   Social Determinants of Health   Financial Resource  Strain: Not on file  Food Insecurity: Not on file  Transportation Needs: Not on file  Physical Activity: Not on file  Stress: Not on file  Social Connections: Not on file    Family History  Problem Relation Age of Onset   Anxiety disorder Mother    Diabetes Father    Obesity Father     Health Maintenance  Topic Date Due   INFLUENZA VACCINE  11/30/2020   PAP SMEAR-Modifier  10/10/2023   TETANUS/TDAP  03/04/2028   COVID-19 Vaccine  Completed   Hepatitis C Screening  Completed   HIV Screening  Completed   HPV VACCINES  Aged Out     ----------------------------------------------------------------------------------------------------------------------------------------------------------------------------------------------------------------- Physical Exam BP (!) 140/92 (BP Location: Left Arm, Patient Position: Sitting, Cuff Size: Normal)   Pulse 83   Temp 98 F (36.7 C)   Ht 5' 3.58" (1.615 m)   Wt 187 lb 3.2 oz (84.9 kg)   LMP 11/30/2020 (Within Days)   SpO2 99%   BMI 32.56 kg/m   Physical Exam Constitutional:      Appearance: Normal appearance.  Eyes:     General: No scleral icterus. Cardiovascular:     Rate and Rhythm: Normal rate and regular rhythm.  Musculoskeletal:     Cervical back:  Neck supple.  Neurological:     General: No focal deficit present.     Mental Status: She is alert.  Psychiatric:        Mood and Affect: Mood normal.        Behavior: Behavior normal.    ------------------------------------------------------------------------------------------------------------------------------------------------------------------------------------------------------------------- Assessment and Plan  Migraine headache Migraines well controlled with topiramate.  She does have Imitrex as needed as well.  Topiramate renewed.  GAD (generalized anxiety disorder) Stable with sertraline at current strength.  Obesity (BMI 30.0-34.9) Currently working with healthy  weight and wellness.  She has done well with [facility] so far.  There is questionable history of PCOS previously.     Meds ordered this encounter  Medications   topiramate (TOPAMAX) 100 MG tablet    Sig: Take 1 tablet (100 mg total) by mouth at bedtime.    Dispense:  90 tablet    Refill:  1    Return in about 6 months (around 07/18/2021) for Migraines.    This visit occurred during the SARS-CoV-2 public health emergency.  Safety protocols were in place, including screening questions prior to the visit, additional usage of staff PPE, and extensive cleaning of exam room while observing appropriate contact time as indicated for disinfecting solutions.

## 2021-01-18 NOTE — Assessment & Plan Note (Addendum)
Migraines well controlled with topiramate.  She does have Imitrex as needed as well.  Topiramate renewed.

## 2021-01-18 NOTE — Progress Notes (Signed)
   Covid-19 Vaccination Clinic  Name:  Alison Davies    MRN: [id number] DOB: 02/11/1986  01/18/2021  [name] was observed post Covid-19 immunization for 15 minutes without incident. She was provided with Vaccine Information Sheet and instruction to access the V-Safe system.   [name] was instructed to call 911 with any severe reactions post vaccine: Difficulty breathing  Swelling of face and throat  A fast heartbeat  A bad rash all over body  Dizziness and weakness

## 2021-01-18 NOTE — Patient Instructions (Addendum)
Great to see you today! Try the shoulder exercises. Let me know if not improving with this.  Try arch straps and/or scaphoid pads for the feet

## 2021-01-26 NOTE — Progress Notes (Signed)
    GYNECOLOGY CLINIC PROCEDURE NOTE  Cryotherapy details  Indication: CIN 1 pathology after colposcopy on 12/30/20  after low-grade squamous intraepithelial neoplasia (LGSIL - encompassing HPV,mild dysplasia,CIN I) pap on 10/09/20. She had LSIL in 2021 as well. She had not completed her gardasil as a teenager at age 35 and would like to complete the series today.   The indications for cryotherapy were reviewed with the patient in detail. She was counseled about that efficacy of this procedure, and possible need for excisional procedure in the future if her cervical dysplasia persists.  The risks of the procedure where explained in detail and patient was told to expect a copious amount of discharge in the next few weeks. All her questions were answered, and written informed consent was obtained.  The patient was placed in the dorsal lithotomy position and a vaginal speculum was placed. Her cervix was visualized and patient was noted to have had normal size transformation zone. The appropriate cryotherapy probe was picked and affixed to cryotherapy apparatus. Then nitrogen gas was then activated, the probe was coated with lubricating jelly and applied to the transformation zone of the cervix. This was kept in place for 1.5 minutes when the vagina started to attach to the probe. I stopped and switched tips and using the smaller tip reapplied and did another 1.5 minutes. The cryotherapy was then stopped and all instruments were removed from the patient's pelvis; a thawing period of 3 minutes was observed.  A second cycle of cryotherapy with the smaller tip was then administered to the cervix for 3 minutes.  The patient tolerated the procedure well without any complications. Routine post procedure instructions were given to the patient.  Will repeat pap in one year. Discussed condom use for at least 3 months once cleared. Would f/u in one month for a check.   Milas Hock, MD, FACOG Obstetrician &  Gynecologist, [facility] for [facility], [facility]

## 2021-01-27 ENCOUNTER — Other Ambulatory Visit (HOSPITAL_BASED_OUTPATIENT_CLINIC_OR_DEPARTMENT_OTHER): Payer: Self-pay

## 2021-01-27 ENCOUNTER — Other Ambulatory Visit: Payer: Self-pay

## 2021-01-27 ENCOUNTER — Ambulatory Visit (INDEPENDENT_AMBULATORY_CARE_PROVIDER_SITE_OTHER): Payer: No Typology Code available for payment source | Admitting: Physician Assistant

## 2021-01-27 ENCOUNTER — Encounter (INDEPENDENT_AMBULATORY_CARE_PROVIDER_SITE_OTHER): Payer: Self-pay | Admitting: Physician Assistant

## 2021-01-27 VITALS — BP 118/83 | HR 83 | Temp 97.6°F | Ht 64.0 in | Wt 183.0 lb

## 2021-01-27 DIAGNOSIS — Z9189 Other specified personal risk factors, not elsewhere classified: Secondary | ICD-10-CM

## 2021-01-27 DIAGNOSIS — Z6830 Body mass index (BMI) 30.0-30.9, adult: Secondary | ICD-10-CM | POA: Diagnosis not present

## 2021-01-27 DIAGNOSIS — E669 Obesity, unspecified: Secondary | ICD-10-CM

## 2021-01-27 DIAGNOSIS — E8881 Metabolic syndrome: Secondary | ICD-10-CM

## 2021-01-27 DIAGNOSIS — E559 Vitamin D deficiency, unspecified: Secondary | ICD-10-CM

## 2021-01-27 MED ORDER — VITAMIN D (ERGOCALCIFEROL) 1.25 MG (50000 UNIT) PO CAPS
ORAL_CAPSULE | ORAL | 0 refills | Status: DC
  Filled 2021-01-27: qty 10, 30d supply, fill #0

## 2021-01-27 MED ORDER — SEMAGLUTIDE-WEIGHT MANAGEMENT 1.7 MG/0.75ML ~~LOC~~ SOAJ
1.7000 mg | SUBCUTANEOUS | 0 refills | Status: DC
Start: 2021-01-27 — End: 2021-02-24
  Filled 2021-01-27: qty 3, 28d supply, fill #0

## 2021-01-27 NOTE — Progress Notes (Signed)
Chief Complaint:   OBESITY Alison Davies is here to discuss her progress with her obesity treatment plan along with follow-up of her obesity related diagnoses. Alison Davies is on the Category 3 Plan and states she is following her eating plan approximately 30% of the time. Alison Davies states she is doing 0 minutes 0 times per week.  Today's visit was #: 56 Starting weight: 199 lbs Starting date: 07/26/2017 Today's weight: 183 lbs Today's date: 01/27/2021 Total lbs lost to date: 16 Total lbs lost since last in-office visit: 0  Interim History: Alison Davies notes since her last visit, she had a family visit and she was sick for almost 3 weeks. She continues to study for [employer] school and she is working full time. She wants to have food delivered from [employer] on the weekends and will meal plan during the week.  Subjective:   1. Vitamin D deficiency Alison Davies is on Vit D twice weekly, and she is tolerating it well. She will be walking outside more.  2. Insulin resistance Alison Davies denies polyphagia on Wegovy.  3. At risk for diabetes mellitus Alison Davies is at higher than average risk for developing diabetes due to obesity.   Assessment/Plan:   1. Vitamin D deficiency Low Vitamin D level contributes to fatigue and are associated with obesity, breast, and colon cancer. We will refill prescription Vitamin D for 1 month. Alison Davies will follow-up for routine testing of Vitamin D, at least 2-3 times per year to avoid over-replacement.  - Vitamin D, Ergocalciferol, (DRISDOL) 1.25 MG (50000 UNIT) CAPS capsule; TAKE 1 CAPSULE (50,000 UNITS TOTAL) BY MOUTH 2 (TWO) TIMES A WEEK.  Dispense: 10 capsule; Refill: 0  2. Insulin resistance Alison Davies will continue her meal plan, and will continue to work on weight loss, exercise, and decreasing simple carbohydrates to help decrease the risk of diabetes. We will recheck labs at her next visit. Alison Davies agreed to follow-up with [name] as directed to closely monitor her progress.  3. At risk for diabetes  mellitus Alison Davies was given approximately 15 minutes of diabetes education and counseling today. We discussed intensive lifestyle modifications today with an emphasis on weight loss as well as increasing exercise and decreasing simple carbohydrates in her diet. We also reviewed medication options with an emphasis on risk versus benefit of those discussed.   Repetitive spaced learning was employed today to elicit superior memory formation and behavioral change.  4. Obesity, current BMI 31.4 Alison Davies is currently in the action stage of change. As such, her goal is to continue with weight loss efforts. She has agreed to keeping a food journal and adhering to recommended goals of 1400-1500 calories and 95 grams of protein daily.   We discussed various medication options to help Alison Davies with her weight loss efforts and we both agreed to continue Wegovy 1.7 mg, and we will refill for 1 month..  - Semaglutide-Weight Management 1.7 MG/0.75ML SOAJ; INJECT 1.7 MG INTO THE SKIN ONCE A WEEK.  Dispense: 3 mL; Refill: 0  We will recheck labs at her next visit.  Exercise goals: No exercise has been prescribed at this time.  Behavioral modification strategies: meal planning and cooking strategies, keeping healthy foods in the home, planning for success, and keeping a strict food journal.  Alison Davies has agreed to follow-up with our clinic in 3 weeks. She was informed of the importance of frequent follow-up visits to maximize her success with intensive lifestyle modifications for her multiple health conditions.   Objective:   Blood pressure 118/83,  pulse 83, temperature 97.6 F (36.4 C), height 5\' 4"  (1.626 m), weight 183 lb (83 kg), SpO2 98 %. Body mass index is 31.41 kg/m.  General: Cooperative, alert, well developed, in no acute distress. HEENT: Conjunctivae and lids unremarkable. Cardiovascular: Regular rhythm.  Lungs: Normal work of breathing. Neurologic: No focal deficits.   Lab Results  Component Value  Date   CREATININE 0.65 10/27/2020   BUN 8 10/27/2020   NA 138 10/27/2020   K 4.1 10/27/2020   CL 100 10/27/2020   CO2 23 10/27/2020   Lab Results  Component Value Date   ALT 24 10/27/2020   AST 20 10/27/2020   ALKPHOS 69 10/27/2020   BILITOT 0.3 10/27/2020   Lab Results  Component Value Date   HGBA1C 4.9 03/24/2020   HGBA1C 4.8 10/23/2019   HGBA1C 4.8 06/13/2019   HGBA1C 5.0 11/13/2018   HGBA1C 4.9 04/19/2018   Lab Results  Component Value Date   INSULIN 12.6 10/27/2020   INSULIN 18.6 03/24/2020   INSULIN 11.1 10/23/2019   INSULIN 15.4 06/13/2019   INSULIN 14.4 11/13/2018   Lab Results  Component Value Date   TSH 1.850 07/26/2017   Lab Results  Component Value Date   CHOL 247 (H) 10/27/2020   HDL 62 10/27/2020   LDLCALC 166 (H) 10/27/2020   TRIG 108 10/27/2020   CHOLHDL 4.0 10/27/2020   Lab Results  Component Value Date   VD25OH 27.6 (L) 10/27/2020   VD25OH 21.1 (L) 03/24/2020   VD25OH 30.9 10/23/2019   Lab Results  Component Value Date   WBC 7.1 06/12/2019   HGB 14.0 06/12/2019   HCT 41.0 06/12/2019   MCV 89.7 06/12/2019   PLT 299.0 06/12/2019   No results found for: IRON, TIBC, FERRITIN  Attestation Statements:   Reviewed by clinician on day of visit: allergies, medications, problem list, medical history, surgical history, family history, social history, and previous encounter notes.   08/10/2019, am acting as transcriptionist for Alison Mcburney, PA-C.  I have reviewed the above documentation for accuracy and completeness, and I agree with the above. [employer], PA-C

## 2021-01-28 ENCOUNTER — Ambulatory Visit: Payer: No Typology Code available for payment source | Admitting: Obstetrics and Gynecology

## 2021-01-28 ENCOUNTER — Encounter: Payer: Self-pay | Admitting: Obstetrics and Gynecology

## 2021-01-28 VITALS — BP 120/72 | HR 97 | Ht 64.0 in | Wt 183.0 lb

## 2021-01-28 DIAGNOSIS — Z01812 Encounter for preprocedural laboratory examination: Secondary | ICD-10-CM

## 2021-01-28 DIAGNOSIS — R87612 Low grade squamous intraepithelial lesion on cytologic smear of cervix (LGSIL): Secondary | ICD-10-CM | POA: Diagnosis not present

## 2021-01-28 DIAGNOSIS — Z23 Encounter for immunization: Secondary | ICD-10-CM

## 2021-01-28 LAB — POCT URINE PREGNANCY: Preg Test, Ur: NEGATIVE

## 2021-02-05 ENCOUNTER — Encounter: Payer: No Typology Code available for payment source | Admitting: Family Medicine

## 2021-02-09 ENCOUNTER — Other Ambulatory Visit (INDEPENDENT_AMBULATORY_CARE_PROVIDER_SITE_OTHER): Payer: Self-pay | Admitting: Family Medicine

## 2021-02-09 DIAGNOSIS — D2261 Melanocytic nevi of right upper limb, including shoulder: Secondary | ICD-10-CM

## 2021-02-09 DIAGNOSIS — D2271 Melanocytic nevi of right lower limb, including hip: Secondary | ICD-10-CM

## 2021-02-22 ENCOUNTER — Ambulatory Visit (INDEPENDENT_AMBULATORY_CARE_PROVIDER_SITE_OTHER): Payer: No Typology Code available for payment source | Admitting: Physician Assistant

## 2021-02-23 ENCOUNTER — Encounter (INDEPENDENT_AMBULATORY_CARE_PROVIDER_SITE_OTHER): Payer: Self-pay

## 2021-02-24 ENCOUNTER — Ambulatory Visit (INDEPENDENT_AMBULATORY_CARE_PROVIDER_SITE_OTHER): Payer: No Typology Code available for payment source | Admitting: Physician Assistant

## 2021-02-24 ENCOUNTER — Other Ambulatory Visit: Payer: Self-pay

## 2021-02-24 ENCOUNTER — Ambulatory Visit (INDEPENDENT_AMBULATORY_CARE_PROVIDER_SITE_OTHER): Payer: No Typology Code available for payment source | Admitting: Family Medicine

## 2021-02-24 ENCOUNTER — Other Ambulatory Visit (HOSPITAL_BASED_OUTPATIENT_CLINIC_OR_DEPARTMENT_OTHER): Payer: Self-pay

## 2021-02-24 ENCOUNTER — Encounter (INDEPENDENT_AMBULATORY_CARE_PROVIDER_SITE_OTHER): Payer: Self-pay | Admitting: Family Medicine

## 2021-02-24 VITALS — BP 116/78 | HR 85 | Temp 98.2°F | Ht 64.0 in | Wt 181.0 lb

## 2021-02-24 DIAGNOSIS — E8881 Metabolic syndrome: Secondary | ICD-10-CM | POA: Diagnosis not present

## 2021-02-24 DIAGNOSIS — E559 Vitamin D deficiency, unspecified: Secondary | ICD-10-CM | POA: Diagnosis not present

## 2021-02-24 DIAGNOSIS — Z9189 Other specified personal risk factors, not elsewhere classified: Secondary | ICD-10-CM | POA: Diagnosis not present

## 2021-02-24 DIAGNOSIS — Z6830 Body mass index (BMI) 30.0-30.9, adult: Secondary | ICD-10-CM

## 2021-02-24 DIAGNOSIS — E669 Obesity, unspecified: Secondary | ICD-10-CM

## 2021-02-24 DIAGNOSIS — E785 Hyperlipidemia, unspecified: Secondary | ICD-10-CM

## 2021-02-24 MED ORDER — VITAMIN D (ERGOCALCIFEROL) 1.25 MG (50000 UNIT) PO CAPS
ORAL_CAPSULE | ORAL | 0 refills | Status: DC
Start: 2021-02-24 — End: 2021-03-17
  Filled 2021-02-24: qty 10, 35d supply, fill #0

## 2021-02-24 MED ORDER — SEMAGLUTIDE-WEIGHT MANAGEMENT 1.7 MG/0.75ML ~~LOC~~ SOAJ
1.7000 mg | SUBCUTANEOUS | 0 refills | Status: DC
  Filled 2021-02-24: qty 3, 28d supply, fill #0

## 2021-02-24 NOTE — Progress Notes (Signed)
Chief Complaint:   OBESITY Alison Davies is here to discuss her progress with her obesity treatment plan along with follow-up of her obesity related diagnoses. Alison Davies is on keeping a food journal and adhering to recommended goals of 1400-1500 calories and 95 grams of protein daily and states she is following her eating plan approximately 60% of the time. Alison Davies states she is walking the dog for 30-45 minutes 1-2 times per week.  Today's visit was #: 19 Starting weight: 199 lbs Starting date: 07/26/2017 Today's weight: 181 lbs Today's date: 02/24/2021 Total lbs lost to date: 18 Total lbs lost since last in-office visit: 2  Interim History: Alison Davies continues to do well with weight loss. She is doing well with Wegovy and her hunger is better controlled. She is still working on meeting her protein goals.  Subjective:   1. Vitamin D deficiency Alison Davies is on Vit D, but her level is not yet at goal. She denies nausea, vomiting, or muscle weakness.  2. Hyperlipidemia, unspecified hyperlipidemia type Alison Davies is working on diet and exercise. She is not on statin, and she is due for labs.  3. Insulin resistance Alison Davies is doing well with diet, exercise, and GLP-1. She is due to have labs.  4. At risk for heart disease Alison Davies is at a higher than average risk for cardiovascular disease due to obesity.   Assessment/Plan:   1. Vitamin D deficiency Low Vitamin D level contributes to fatigue and are associated with obesity, breast, and colon cancer. We will refill prescription Vitamin D for 1 month. We will check labs today, and Alison Davies will follow-up for routine testing of Vitamin D, at least 2-3 times per year to avoid over-replacement.  - Vitamin D, Ergocalciferol, (DRISDOL) 1.25 MG (50000 UNIT) CAPS capsule; TAKE 1 CAPSULE (50,000 UNITS TOTAL) BY MOUTH 2 (TWO) TIMES A WEEK.  Dispense: 10 capsule; Refill: 0 - VITAMIN D 25 Hydroxy (Vit-D Deficiency, Fractures)  2. Hyperlipidemia, unspecified hyperlipidemia  type Cardiovascular risk and specific lipid/LDL goals reviewed. We discussed several lifestyle modifications today. We will check labs today. Alison Davies will continue to work on diet, exercise and weight loss efforts. Orders and follow up as documented in patient record.   - Lipid Panel With LDL/HDL Ratio  3. Insulin resistance Alison Davies will continue to work on weight loss, exercise, and decreasing simple carbohydrates to help decrease the risk of diabetes. We will check labs today. Alison Davies agreed to follow-up with [name] as directed to closely monitor her progress.  - CMP14+EGFR - Insulin, random - Hemoglobin A1c  4. At risk for heart disease Alison Davies was given approximately 15 minutes of coronary artery disease prevention counseling today. She is 35 y.o. female and has risk factors for heart disease including obesity. We discussed intensive lifestyle modifications today with an emphasis on specific weight loss instructions and strategies.   Repetitive spaced learning was employed today to elicit superior memory formation and behavioral change.  5. Obesity, current BMI 31.1 Alison Davies is currently in the action stage of change. As such, her goal is to continue with weight loss efforts. She has agreed to the Category 2 Plan.   We discussed various medication options to help Estephani with her weight loss efforts and we both agreed to continue Alison Davies, and we will refill for 1 month..  - Semaglutide-Weight Management 1.7 MG/0.75ML SOAJ; INJECT 1.7 MG INTO THE SKIN ONCE A WEEK.  Dispense: 3 mL; Refill: 0  Exercise goals: As is.  Behavioral modification strategies: increasing lean protein intake  and meal planning and cooking strategies.  Alison Davies has agreed to follow-up with our clinic in 3 weeks. She was informed of the importance of frequent follow-up visits to maximize her success with intensive lifestyle modifications for her multiple health conditions.   Alison Davies was informed we would discuss her lab results at her  next visit unless there is a critical issue that needs to be addressed sooner. Alison Davies agreed to keep her next visit at the agreed upon time to discuss these results.  Objective:   Blood pressure 116/78, pulse 85, temperature 98.2 F (36.8 C), height _0  (1.626 m), weight 181 lb (82.1 kg), SpO2 98 %. Body mass index is 31.07 kg/m.  General: Cooperative, alert, well developed, in no acute distress. HEENT: Conjunctivae and lids unremarkable. Cardiovascular: Regular rhythm.  Lungs: Normal work of breathing. Neurologic: No focal deficits.   Lab Results  Component Value Date   CREATININE 0.65 10/27/2020   BUN 8 10/27/2020   NA 138 10/27/2020   K 4.1 10/27/2020   CL 100 10/27/2020   CO2 23 10/27/2020   Lab Results  Component Value Date   ALT 24 10/27/2020   AST 20 10/27/2020   ALKPHOS 69 10/27/2020   BILITOT 0.3 10/27/2020   Lab Results  Component Value Date   HGBA1C 4.9 03/24/2020   HGBA1C 4.8 10/23/2019   HGBA1C 4.8 06/13/2019   HGBA1C 5.0 11/13/2018   HGBA1C 4.9 04/19/2018   Lab Results  Component Value Date   INSULIN 12.6 10/27/2020   INSULIN 18.6 03/24/2020   INSULIN 11.1 10/23/2019   INSULIN 15.4 06/13/2019   INSULIN 14.4 11/13/2018   Lab Results  Component Value Date   TSH 1.850 07/26/2017   Lab Results  Component Value Date   CHOL 247 (H) 10/27/2020   HDL 62 10/27/2020   LDLCALC 166 (H) 10/27/2020   TRIG 108 10/27/2020   CHOLHDL 4.0 10/27/2020   Lab Results  Component Value Date   VD25OH 27.6 (L) 10/27/2020   VD25OH 21.1 (L) 03/24/2020   VD25OH 30.9 10/23/2019   Lab Results  Component Value Date   WBC 7.1 06/12/2019   HGB 14.0 06/12/2019   HCT 41.0 06/12/2019   MCV 89.7 06/12/2019   PLT 299.0 06/12/2019   No results found for: IRON, TIBC, FERRITIN  Attestation Statements:   Reviewed by clinician on day of visit: allergies, medications, problem list, medical history, surgical history, family history, social history, and previous encounter  notes.   I, Trixie Dredge, am acting as transcriptionist for Dennard Nip, MD.  I have reviewed the above documentation for accuracy and completeness, and I agree with the above. -  Dennard Nip, MD

## 2021-02-25 LAB — LIPID PANEL WITH LDL/HDL RATIO
Cholesterol, Total: 268 mg/dL — ABNORMAL HIGH (ref 100–199)
HDL: 58 mg/dL (ref >39)
LDL Chol Calc (NIH): 189 mg/dL — ABNORMAL HIGH (ref 0–99)
LDL/HDL Ratio: 3.3 ratio — ABNORMAL HIGH (ref 0.0–3.2)
Triglycerides: 118 mg/dL (ref 0–149)
VLDL Cholesterol Cal: 21 mg/dL (ref 5–40)

## 2021-02-25 LAB — CMP14+EGFR
ALT: 17 IU/L (ref 0–32)
AST: 15 IU/L (ref 0–40)
Albumin/Globulin Ratio: 1.7 (ref 1.2–2.2)
Albumin: 4.7 g/dL (ref 3.8–4.8)
Alkaline Phosphatase: 90 IU/L (ref 44–121)
BUN/Creatinine Ratio: 15 (ref 9–23)
BUN: 10 mg/dL (ref 6–20)
Bilirubin Total: 0.6 mg/dL (ref 0.0–1.2)
CO2: 19 mmol/L — ABNORMAL LOW (ref 20–29)
Calcium: 9.5 mg/dL (ref 8.7–10.2)
Chloride: 106 mmol/L (ref 96–106)
Creatinine, Ser: 0.66 mg/dL (ref 0.57–1.00)
Globulin, Total: 2.8 g/dL (ref 1.5–4.5)
Glucose: 79 mg/dL (ref 70–99)
Potassium: 4.4 mmol/L (ref 3.5–5.2)
Sodium: 141 mmol/L (ref 134–144)
Total Protein: 7.5 g/dL (ref 6.0–8.5)
eGFR: 119 mL/min/{1.73_m2} (ref >59)

## 2021-02-25 LAB — HEMOGLOBIN A1C
Est. average glucose Bld gHb Est-mCnc: 91 mg/dL
Hgb A1c MFr Bld: 4.8 % (ref 4.8–5.6)

## 2021-02-25 LAB — INSULIN, RANDOM: INSULIN: 12.9 u[IU]/mL (ref 2.6–24.9)

## 2021-02-25 LAB — VITAMIN D 25 HYDROXY (VIT D DEFICIENCY, FRACTURES): Vit D, 25-Hydroxy: 18.2 ng/mL — ABNORMAL LOW (ref 30.0–100.0)

## 2021-03-01 ENCOUNTER — Ambulatory Visit: Payer: No Typology Code available for payment source | Admitting: Obstetrics and Gynecology

## 2021-03-01 ENCOUNTER — Encounter: Payer: Self-pay | Admitting: Obstetrics and Gynecology

## 2021-03-01 ENCOUNTER — Other Ambulatory Visit: Payer: Self-pay

## 2021-03-01 VITALS — BP 113/77 | HR 89 | Ht 64.0 in | Wt 185.0 lb

## 2021-03-01 DIAGNOSIS — Z09 Encounter for follow-up examination after completed treatment for conditions other than malignant neoplasm: Secondary | ICD-10-CM | POA: Diagnosis not present

## 2021-03-01 NOTE — Progress Notes (Signed)
GYNECOLOGY OFFICE VISIT NOTE  History:   Alison Davies is a 35 y.o. G0P0000 here today for follow up following her cryo.  She had the cryo on 9/29.  She had a h/o low grade paps and biopsies in 2021 and 2022.    Since her procedure she is doing well. She does still have some discharge that comes out when she first wakes in the morning. She is on her period currently.   She notes periods are irregular. She skips them some months. She is not TTC currently but is starting to consider it as she would like to have a child at some point.   She denies any abnormal vaginal discharge, bleeding, pelvic pain or other concerns.     Past Medical History:  Diagnosis Date   Anxiety    Hyperlipidemia    Leg edema    Migraines    Vaginal Pap smear, abnormal 06/12/2019   LGSIL +HPV    Past Surgical History:  Procedure Laterality Date   COLPOSCOPY N/A     The following portions of the patient's history were reviewed and updated as appropriate: allergies, current medications, past family history, past medical history, past social history, past surgical history and problem list.   Health Maintenance:    Diagnosis  Date Value Ref Range Status  10/09/2020 - Low grade squamous intraepithelial lesion (LSIL) (A)  Final       Review of Systems:  Pertinent items noted in HPI and remainder of comprehensive ROS otherwise negative.  Physical Exam:  BP 113/77   Pulse 89   Ht _0  (1.626 m)   Wt 185 lb (83.9 kg)   LMP 03/01/2021   BMI 31.76 kg/m  CONSTITUTIONAL: Well-developed, well-nourished female in no acute distress.  HEENT:  Normocephalic, atraumatic. External right and left ear normal. No scleral icterus.  NECK: Normal range of motion, supple, no masses noted on observation SKIN: No rash noted. Not diaphoretic. No erythema. No pallor. MUSCULOSKELETAL: Normal range of motion. No edema noted. NEUROLOGIC: Alert and oriented to person, place, and time. Normal muscle tone coordination.  No cranial nerve deficit noted. PSYCHIATRIC: Normal mood and affect. Normal behavior. Normal judgment and thought content.  CARDIOVASCULAR: Normal heart rate noted RESPIRATORY: Effort and breath sounds normal, no problems with respiration noted ABDOMEN: No masses noted. No other overt distention noted.    PELVIC: Normal appearing external genitalia; normal urethral meatus; normal appearing vaginal mucosa and cervix with the cervical bed almost completely healed - some areas of granulation tissue noted still on the anterior aspect of the cervix.  No abnormal discharge noted.  Normal uterine size, no other palpable masses, no uterine or adnexal tenderness. Performed in the presence of a chaperone  Labs and Imaging Results for orders placed or performed in visit on 02/24/21 (from the past 168 hour(s))  CMP14+EGFR   Collection Time: 02/24/21  2:57 PM  Result Value Ref Range   Glucose 79 70 - 99 mg/dL   BUN 10 6 - 20 mg/dL   Creatinine, Ser 0.66 0.57 - 1.00 mg/dL   eGFR 119 >59 mL/min/1.73   BUN/Creatinine Ratio 15 9 - 23   Sodium 141 134 - 144 mmol/L   Potassium 4.4 3.5 - 5.2 mmol/L   Chloride 106 96 - 106 mmol/L   CO2 19 (L) 20 - 29 mmol/L   Calcium 9.5 8.7 - 10.2 mg/dL   Total Protein 7.5 6.0 - 8.5 g/dL   Albumin 4.7 3.8 - 4.8 g/dL  Globulin, Total 2.8 1.5 - 4.5 g/dL   Albumin/Globulin Ratio 1.7 1.2 - 2.2   Bilirubin Total 0.6 0.0 - 1.2 mg/dL   Alkaline Phosphatase 90 44 - 121 IU/L   AST 15 0 - 40 IU/L   ALT 17 0 - 32 IU/L  Lipid Panel With LDL/HDL Ratio   Collection Time: 02/24/21  2:57 PM  Result Value Ref Range   Cholesterol, Total 268 (H) 100 - 199 mg/dL   Triglycerides 118 0 - 149 mg/dL   HDL 58 >39 mg/dL   VLDL Cholesterol Cal 21 5 - 40 mg/dL   LDL Chol Calc (NIH) 189 (H) 0 - 99 mg/dL   LDL/HDL Ratio 3.3 (H) 0.0 - 3.2 ratio  Insulin, random   Collection Time: 02/24/21  2:57 PM  Result Value Ref Range   INSULIN 12.9 2.6 - 24.9 uIU/mL  Hemoglobin A1c   Collection  Time: 02/24/21  2:57 PM  Result Value Ref Range   Hgb A1c MFr Bld 4.8 4.8 - 5.6 %   Est. average glucose Bld gHb Est-mCnc 91 mg/dL  VITAMIN D 25 Hydroxy (Vit-D Deficiency, Fractures)   Collection Time: 02/24/21  2:57 PM  Result Value Ref Range   Vit D, 25-Hydroxy 18.2 (L) 30.0 - 100.0 ng/mL   No results found.  Assessment and Plan:  Diagnoses and all orders for this visit:  Postop check - Healing well - anticipate AM discharge should resolve in the next 2 weeks   As for cycles/fertility - if she desires to actively TTC, she would be a candidate for femara/clomid. She sounds like clinically she has PCOS (unwanted hair growth, irregular cycles) but has not had lab tested. We discussed may be helpful but not required. If desires pregnancy, would advise coming in for evaluation and then reviewing risks of ovulation induction.    Routine preventative health maintenance measures emphasized. Please refer to After Visit Summary for other counseling recommendations.   No follow-ups on file.  I spent 20 minutes dedicated to the care of this patient including pre-visit review of records, face to face time with the patient discussing her conditions and treatments and post visit orders.    Radene Gunning, MD, [place] for [facility], [place]

## 2021-03-17 ENCOUNTER — Other Ambulatory Visit (HOSPITAL_BASED_OUTPATIENT_CLINIC_OR_DEPARTMENT_OTHER): Payer: Self-pay

## 2021-03-17 ENCOUNTER — Other Ambulatory Visit: Payer: Self-pay

## 2021-03-17 ENCOUNTER — Encounter (INDEPENDENT_AMBULATORY_CARE_PROVIDER_SITE_OTHER): Payer: Self-pay | Admitting: Physician Assistant

## 2021-03-17 ENCOUNTER — Ambulatory Visit (INDEPENDENT_AMBULATORY_CARE_PROVIDER_SITE_OTHER): Payer: No Typology Code available for payment source | Admitting: Physician Assistant

## 2021-03-17 VITALS — BP 119/82 | HR 82 | Temp 98.1°F | Ht 64.0 in | Wt 179.0 lb

## 2021-03-17 DIAGNOSIS — E785 Hyperlipidemia, unspecified: Secondary | ICD-10-CM

## 2021-03-17 DIAGNOSIS — E559 Vitamin D deficiency, unspecified: Secondary | ICD-10-CM

## 2021-03-17 DIAGNOSIS — E669 Obesity, unspecified: Secondary | ICD-10-CM

## 2021-03-17 DIAGNOSIS — Z6830 Body mass index (BMI) 30.0-30.9, adult: Secondary | ICD-10-CM | POA: Diagnosis not present

## 2021-03-17 DIAGNOSIS — Z9189 Other specified personal risk factors, not elsewhere classified: Secondary | ICD-10-CM | POA: Diagnosis not present

## 2021-03-17 MED ORDER — VITAMIN D (ERGOCALCIFEROL) 1.25 MG (50000 UNIT) PO CAPS
ORAL_CAPSULE | ORAL | 0 refills | Status: DC
  Filled 2021-03-17 – 2021-03-24 (×2): qty 10, 35d supply, fill #0

## 2021-03-17 MED ORDER — SEMAGLUTIDE-WEIGHT MANAGEMENT 1.7 MG/0.75ML ~~LOC~~ SOAJ
1.7000 mg | SUBCUTANEOUS | 0 refills | Status: DC
  Filled 2021-03-17 – 2021-03-18 (×2): qty 3, 28d supply, fill #0

## 2021-03-17 NOTE — Progress Notes (Signed)
Chief Complaint:   OBESITY Alison Davies is here to discuss her progress with her obesity treatment plan along with follow-up of her obesity related diagnoses. Alison Davies is on the Category 2 Plan and states she is following her eating plan approximately 75% of the time. Alison Davies states she is doing 0 minutes 0 times per week.  Today's visit was #: 13 Starting weight: 199 lbs Starting date: 07/26/2017 Today's weight: 179 lbs Today's date: 03/17/2021 Total lbs lost to date: 20 Total lbs lost since last in-office visit: 2  Interim History: Jovana is flying to [place] for vacation. She got a new job at [employer] as a [employer] and she will start in a few weeks. She will also be traveling to [place] for vacation soon. She feels like she does better with a structured plan.  Subjective:   1. Hyperlipidemia, unspecified hyperlipidemia type Alison Davies is not on medications, and she denies chest pain. Her lipids are worsening, but she has been eating out more than normal. I discussed labs with the patient today.  2. Vitamin D deficiency Alison Davies's Vit D level is worsening, as she is not taking her Vit D as prescribed. I discussed labs with the patient today.  3. At risk for heart disease Alison Davies is at a higher than average risk for cardiovascular disease due to obesity.   Assessment/Plan:   1. Hyperlipidemia, unspecified hyperlipidemia type Cardiovascular risk and specific lipid/LDL goals reviewed. We discussed several lifestyle modifications today. Alison Davies is to decrease eating out, and increase her activity. Orders and follow up as documented in patient record.   Counseling Intensive lifestyle modifications are the first line treatment for this issue. Dietary changes: Increase soluble fiber. Decrease simple carbohydrates. Exercise changes: Moderate to vigorous-intensity aerobic activity 150 minutes per week if tolerated. Lipid-lowering medications: see documented in medical record.  2. Vitamin D  deficiency Low Vitamin D level contributes to fatigue and are associated with obesity, breast, and colon cancer. We will refill prescription Vitamin D for 1 month. Alison Davies will follow-up for routine testing of Vitamin D, at least 2-3 times per year to avoid over-replacement.  - Vitamin D, Ergocalciferol, (DRISDOL) 1.25 MG (50000 UNIT) CAPS capsule; TAKE 1 CAPSULE (50,000 UNITS TOTAL) BY MOUTH 2 (TWO) TIMES A WEEK.  Dispense: 10 capsule; Refill: 0  3. At risk for heart disease Alison Davies was given approximately 15 minutes of coronary artery disease prevention counseling today. She is 35 y.o. female and has risk factors for heart disease including obesity. We discussed intensive lifestyle modifications today with an emphasis on specific weight loss instructions and strategies.   Repetitive spaced learning was employed today to elicit superior memory formation and behavioral change.  4. Obesity, current BMI 31.1 Alison Davies is currently in the action stage of change. As such, her goal is to continue with weight loss efforts. She has agreed to the Category 2 Plan.   We discussed various medication options to help Alison Davies with her weight loss efforts and we both agreed to continue Wegovy 1.7 mg, and we will refill for 1 month.  - Semaglutide-Weight Management 1.7 MG/0.75ML SOAJ; INJECT 1.7 MG INTO THE SKIN ONCE A WEEK.  Dispense: 3 mL; Refill: 0  Exercise goals: No exercise has been prescribed at this time.  Behavioral modification strategies: meal planning and cooking strategies and keeping healthy foods in the home.  Alison Davies has agreed to follow-up with our clinic in 3 weeks. She was informed of the importance of frequent follow-up visits to  maximize her success with intensive lifestyle modifications for her multiple health conditions.   Objective:   Blood pressure 119/82, pulse 82, temperature 98.1 F (36.7 C), height 5\' 4"  (1.626 m), weight 179 lb (81.2 kg), last menstrual period 03/01/2021, SpO2 96 %. Body  mass index is 30.73 kg/m.  General: Cooperative, alert, well developed, in no acute distress. HEENT: Conjunctivae and lids unremarkable. Cardiovascular: Regular rhythm.  Lungs: Normal work of breathing. Neurologic: No focal deficits.   Lab Results  Component Value Date   CREATININE 0.66 02/24/2021   BUN 10 02/24/2021   NA 141 02/24/2021   K 4.4 02/24/2021   CL 106 02/24/2021   CO2 19 (L) 02/24/2021   Lab Results  Component Value Date   ALT 17 02/24/2021   AST 15 02/24/2021   ALKPHOS 90 02/24/2021   BILITOT 0.6 02/24/2021   Lab Results  Component Value Date   HGBA1C 4.8 02/24/2021   HGBA1C 4.9 03/24/2020   HGBA1C 4.8 10/23/2019   HGBA1C 4.8 06/13/2019   HGBA1C 5.0 11/13/2018   Lab Results  Component Value Date   INSULIN 12.9 02/24/2021   INSULIN 12.6 10/27/2020   INSULIN 18.6 03/24/2020   INSULIN 11.1 10/23/2019   INSULIN 15.4 06/13/2019   Lab Results  Component Value Date   TSH 1.850 07/26/2017   Lab Results  Component Value Date   CHOL 268 (H) 02/24/2021   HDL 58 02/24/2021   LDLCALC 189 (H) 02/24/2021   TRIG 118 02/24/2021   CHOLHDL 4.0 10/27/2020   Lab Results  Component Value Date   VD25OH 18.2 (L) 02/24/2021   VD25OH 27.6 (L) 10/27/2020   VD25OH 21.1 (L) 03/24/2020   Lab Results  Component Value Date   WBC 7.1 06/12/2019   HGB 14.0 06/12/2019   HCT 41.0 06/12/2019   MCV 89.7 06/12/2019   PLT 299.0 06/12/2019   No results found for: IRON, TIBC, FERRITIN  Attestation Statements:   Reviewed by clinician on day of visit: allergies, medications, problem list, medical history, surgical history, family history, social history, and previous encounter notes.   08/10/2019, am acting as transcriptionist for Trude Mcburney, PA-C.  I have reviewed the above documentation for accuracy and completeness, and I agree with the above. [employer], PA-C

## 2021-03-18 ENCOUNTER — Other Ambulatory Visit (HOSPITAL_BASED_OUTPATIENT_CLINIC_OR_DEPARTMENT_OTHER): Payer: Self-pay

## 2021-03-24 ENCOUNTER — Other Ambulatory Visit (HOSPITAL_BASED_OUTPATIENT_CLINIC_OR_DEPARTMENT_OTHER): Payer: Self-pay

## 2021-03-25 ENCOUNTER — Encounter: Payer: Self-pay | Admitting: Family Medicine

## 2021-03-25 DIAGNOSIS — F411 Generalized anxiety disorder: Secondary | ICD-10-CM

## 2021-03-29 ENCOUNTER — Other Ambulatory Visit (HOSPITAL_BASED_OUTPATIENT_CLINIC_OR_DEPARTMENT_OTHER): Payer: Self-pay

## 2021-03-30 ENCOUNTER — Other Ambulatory Visit (HOSPITAL_BASED_OUTPATIENT_CLINIC_OR_DEPARTMENT_OTHER): Payer: Self-pay

## 2021-03-30 MED ORDER — ALPRAZOLAM 0.25 MG PO TABS
ORAL_TABLET | ORAL | 0 refills | Status: DC
Start: 2021-03-30 — End: 2022-01-24
  Filled 2021-03-30 – 2021-04-12 (×2): qty 30, 30d supply, fill #0

## 2021-04-06 ENCOUNTER — Other Ambulatory Visit (HOSPITAL_BASED_OUTPATIENT_CLINIC_OR_DEPARTMENT_OTHER): Payer: Self-pay

## 2021-04-12 ENCOUNTER — Other Ambulatory Visit (HOSPITAL_BASED_OUTPATIENT_CLINIC_OR_DEPARTMENT_OTHER): Payer: Self-pay

## 2021-04-12 ENCOUNTER — Telehealth: Payer: No Typology Code available for payment source | Admitting: Nurse Practitioner

## 2021-04-12 DIAGNOSIS — U071 COVID-19: Secondary | ICD-10-CM

## 2021-04-12 MED ORDER — MOLNUPIRAVIR EUA 200MG CAPSULE
4.0000 | ORAL_CAPSULE | Freq: Two times a day (BID) | ORAL | 0 refills | Status: AC
  Filled 2021-04-12: qty 40, 5d supply, fill #0

## 2021-04-12 NOTE — Progress Notes (Signed)
Virtual Visit Consent   Alison Davies, you are scheduled for a virtual visit with a [facility] provider today.     Just as with appointments in the office, your consent must be obtained to participate.  Your consent will be active for this visit and any virtual visit you may have with one of our providers in the next 365 days.     If you have a MyChart account, a copy of this consent can be sent to you electronically.  All virtual visits are billed to your insurance company just like a traditional visit in the office.    As this is a virtual visit, video technology does not allow for your provider to perform a traditional examination.  This may limit your provider's ability to fully assess your condition.  If your provider identifies any concerns that need to be evaluated in person or the need to arrange testing (such as labs, EKG, etc.), we will make arrangements to do so.     Although advances in technology are sophisticated, we cannot ensure that it will always work on either your end or our end.  If the connection with a video visit is poor, the visit may have to be switched to a telephone visit.  With either a video or telephone visit, we are not always able to ensure that we have a secure connection.     I need to obtain your verbal consent now.   Are you willing to proceed with your visit today?    Alison Davies has provided verbal consent on 04/12/2021 for a virtual visit (video or telephone).   Viviano Simas, FNP   Date: 04/12/2021 9:38 AM   Virtual Visit via Video Note   I, Viviano Simas, connected with  Alison Davies  ([id number], [birth year]) on 04/12/21 at  9:45 AM EST by a video-enabled telemedicine application and verified that I am speaking with the correct person using two identifiers.  Location: Patient: Virtual Visit Location Patient: Home Provider: Virtual Visit Location Provider: Home Office   I discussed the limitations of evaluation and management by telemedicine  and the availability of in person appointments. The patient expressed understanding and agreed to proceed.    History of Present Illness: Alison Davies is a 35 y.o. who identifies as a female who was assigned female at birth, and is being seen today after testing positive for COVID-19 today at home.   Her symptoms started yesterday.  Symptoms include: congestion, fatigue, fever, sore throat, cough   She has not had COVID in the past  She has been vaccinated for COVID-19 x4 most recent booster was 2 months ago.    Problems:  Patient Active Problem List   Diagnosis Date Noted   Obesity (BMI 30.0-34.9) 01/18/2021   Left knee pain 04/08/2019   Pain of right heel 01/16/2018   Low back pain 12/07/2017   HLD (hyperlipidemia) 10/05/2017   GAD (generalized anxiety disorder) 10/05/2017   Migraine headache 11/16/2012    Allergies: No Known Allergies Medications:  Current Outpatient Medications:    ALPRAZolam (XANAX) 0.25 MG tablet, TAKE 1 TABLET (0.25 MG TOTAL) BY MOUTH AT BEDTIME AS NEEDED FOR ANXIETY., Disp: 30 tablet, Rfl: 0   Biotin 3 MG TABS, Take 2 tablets by mouth daily., Disp: , Rfl:    Cyanocobalamin (VITAMIN B-12 CR) 1500 MCG TBCR, Take 2 tablets by mouth 2 (two) times daily., Disp: , Rfl:    Melatonin 5 MG CHEW, Chew 2 capsules by  mouth at bedtime as needed., Disp: , Rfl:    Multiple Vitamin (MULTIVITAMIN WITH MINERALS) TABS tablet, Take 1 tablet by mouth daily., Disp: , Rfl:    norethindrone (MICRONOR) 0.35 MG tablet, TAKE 1 TABLET BY MOUTH ONCE DAILY, Disp: 84 tablet, Rfl: 3   Semaglutide-Weight Management 1.7 MG/0.75ML SOAJ, INJECT 1.7 MG INTO THE SKIN ONCE A WEEK., Disp: 3 mL, Rfl: 0   sertraline (ZOLOFT) 100 MG tablet, Take 1&1/2 tablets (150 mg total) by mouth daily., Disp: 135 tablet, Rfl: 1   SUMAtriptan (IMITREX) 25 MG tablet, Take 1 tablet (25 mg total) by mouth every 2 (two) hours as needed for migraine. May repeat in 2 hours if headache persists or recurs., Disp: 10  tablet, Rfl: 2   topiramate (TOPAMAX) 100 MG tablet, Take 1 tablet (100 mg total) by mouth at bedtime., Disp: 90 tablet, Rfl: 1   Turmeric (QC TUMERIC COMPLEX PO), Take by mouth., Disp: , Rfl:    vitamin C (ASCORBIC ACID) 250 MG tablet, Take 250 mg by mouth daily., Disp: , Rfl:    Vitamin D, Ergocalciferol, (DRISDOL) 1.25 MG (50000 UNIT) CAPS capsule, TAKE 1 CAPSULE (50,000 UNITS TOTAL) BY MOUTH 2 (TWO) TIMES A WEEK., Disp: 10 capsule, Rfl: 0  Observations/Objective: Patient is well-developed, well-nourished in no acute distress.  Resting comfortably at home.  Head is normocephalic, atraumatic.  No labored breathing.  Speech is clear and coherent with logical content.  Patient is alert and oriented at baseline.    Assessment and Plan: 1. COVID-19 Take anti-viral with food  - molnupiravir EUA (LAGEVRIO) 200 mg CAPS capsule; Take 4 capsules (800 mg total) by mouth 2 (two) times daily for 5 days.  Dispense: 40 capsule; Refill: 0    Continue to manage symptoms with OTC medications as discussed.  Rtwork note sent via Mychart   Follow Up Instructions: I discussed the assessment and treatment plan with the patient. The patient was provided an opportunity to ask questions and all were answered. The patient agreed with the plan and demonstrated an understanding of the instructions.  A copy of instructions were sent to the patient via MyChart unless otherwise noted below.     The patient was advised to call back or seek an in-person evaluation if the symptoms worsen or if the condition fails to improve as anticipated.  Time:  I spent 15 minutes with the patient via telehealth technology discussing the above problems/concerns.    Viviano Simas, FNP

## 2021-04-14 ENCOUNTER — Telehealth (INDEPENDENT_AMBULATORY_CARE_PROVIDER_SITE_OTHER): Payer: No Typology Code available for payment source | Admitting: Physician Assistant

## 2021-04-14 ENCOUNTER — Encounter (INDEPENDENT_AMBULATORY_CARE_PROVIDER_SITE_OTHER): Payer: Self-pay | Admitting: Physician Assistant

## 2021-04-14 ENCOUNTER — Other Ambulatory Visit (HOSPITAL_BASED_OUTPATIENT_CLINIC_OR_DEPARTMENT_OTHER): Payer: Self-pay

## 2021-04-14 ENCOUNTER — Other Ambulatory Visit: Payer: Self-pay

## 2021-04-14 DIAGNOSIS — E669 Obesity, unspecified: Secondary | ICD-10-CM

## 2021-04-14 DIAGNOSIS — Z6830 Body mass index (BMI) 30.0-30.9, adult: Secondary | ICD-10-CM | POA: Diagnosis not present

## 2021-04-14 DIAGNOSIS — U071 COVID-19: Secondary | ICD-10-CM | POA: Diagnosis not present

## 2021-04-14 MED ORDER — SEMAGLUTIDE-WEIGHT MANAGEMENT 1.7 MG/0.75ML ~~LOC~~ SOAJ
1.7000 mg | SUBCUTANEOUS | 0 refills | Status: DC
  Filled 2021-04-14 – 2021-05-11 (×2): qty 3, 28d supply, fill #0

## 2021-04-14 NOTE — Progress Notes (Signed)
TeleHealth Visit:  Due to the COVID-19 pandemic, this visit was completed with telemedicine (audio/video) technology to reduce patient and provider exposure as well as to preserve personal protective equipment.   Alison Davies has verbally consented to this TeleHealth visit. The patient is located at home, the provider is located at the [facility] office. The participants in this visit include the listed provider and patient. The visit was conducted today via MyChart video.   Chief Complaint: OBESITY Alison Davies is here to discuss her progress with her obesity treatment plan along with follow-up of her obesity related diagnoses. Alison Davies is on the Category 2 Plan and states she is following her eating plan approximately 70% of the time. Alison Davies states she is walking 3 hours 4 times per week.   Today's visit was #: 59 Starting weight: 199 lbs Starting date: 07/26/2017  Interim History: Alison Davies is at home today due to having COVID. She is feeling better overall, but she continues to have congestion and feels tired. She has been grazing throughout the day and eating chicken soup.  Subjective:   1. COVID-19 Alison Davies was seen by her primary care physician recently. She is on an anti-viral and cough medicine. She is feeling fatigued and congested, but improving overall.  Assessment/Plan:   1. COVID-19  is to follow up with her primary care physician as needed.  2. Obesity, current BMI 30.71 Alison Davies is currently in the action stage of change. As such, her goal is to continue with weight loss efforts. She has agreed to the Category 2 Plan.   We discussed various medication options to help Alison Davies with her weight loss efforts and we both agreed to continue Wegovy 1.7 mg, and we will refill for 3 months.  Exercise goals: As is.  Behavioral modification strategies: no skipping meals and meal planning and cooking strategies.  Alison Davies has agreed to follow-up with our clinic in 3 to 4 weeks. She was  informed of the importance of frequent follow-up visits to maximize her success with intensive lifestyle modifications for her multiple health conditions.  Objective:   VITALS: Per patient if applicable, see vitals. GENERAL: Alert and in no acute distress. CARDIOPULMONARY: No increased WOB. Speaking in clear sentences.  PSYCH: Pleasant and cooperative. Speech normal rate and rhythm. Affect is appropriate. Insight and judgement are appropriate. Attention is focused, linear, and appropriate.  NEURO: Oriented as arrived to appointment on time with no prompting.   Lab Results  Component Value Date   CREATININE 0.66 02/24/2021   BUN 10 02/24/2021   NA 141 02/24/2021   K 4.4 02/24/2021   CL 106 02/24/2021   CO2 19 (L) 02/24/2021   Lab Results  Component Value Date   ALT 17 02/24/2021   AST 15 02/24/2021   ALKPHOS 90 02/24/2021   BILITOT 0.6 02/24/2021   Lab Results  Component Value Date   HGBA1C 4.8 02/24/2021   HGBA1C 4.9 03/24/2020   HGBA1C 4.8 10/23/2019   HGBA1C 4.8 06/13/2019   HGBA1C 5.0 11/13/2018   Lab Results  Component Value Date   INSULIN 12.9 02/24/2021   INSULIN 12.6 10/27/2020   INSULIN 18.6 03/24/2020   INSULIN 11.1 10/23/2019   INSULIN 15.4 06/13/2019   Lab Results  Component Value Date   TSH 1.850 07/26/2017   Lab Results  Component Value Date   CHOL 268 (H) 02/24/2021   HDL 58 02/24/2021   LDLCALC 189 (H) 02/24/2021   TRIG 118 02/24/2021   CHOLHDL 4.0 10/27/2020  Lab Results  Component Value Date   VD25OH 18.2 (L) 02/24/2021   VD25OH 27.6 (L) 10/27/2020   VD25OH 21.1 (L) 03/24/2020   Lab Results  Component Value Date   WBC 7.1 06/12/2019   HGB 14.0 06/12/2019   HCT 41.0 06/12/2019   MCV 89.7 06/12/2019   PLT 299.0 06/12/2019   No results found for: IRON, TIBC, FERRITIN  Attestation Statements:   Reviewed by clinician on day of visit: allergies, medications, problem list, medical history, surgical history, family history, social  history, and previous encounter notes.   Wilhemena Durie, am acting as transcriptionist for [employer], PA-C.  I have reviewed the above documentation for accuracy and completeness, and I agree with the above. Abby Potash, PA-C

## 2021-04-23 ENCOUNTER — Other Ambulatory Visit (HOSPITAL_BASED_OUTPATIENT_CLINIC_OR_DEPARTMENT_OTHER): Payer: Self-pay

## 2021-04-27 ENCOUNTER — Other Ambulatory Visit (HOSPITAL_BASED_OUTPATIENT_CLINIC_OR_DEPARTMENT_OTHER): Payer: Self-pay

## 2021-05-10 ENCOUNTER — Ambulatory Visit (INDEPENDENT_AMBULATORY_CARE_PROVIDER_SITE_OTHER): Payer: No Typology Code available for payment source | Admitting: Physician Assistant

## 2021-05-10 ENCOUNTER — Encounter (INDEPENDENT_AMBULATORY_CARE_PROVIDER_SITE_OTHER): Payer: Self-pay | Admitting: Family Medicine

## 2021-05-10 ENCOUNTER — Other Ambulatory Visit (HOSPITAL_BASED_OUTPATIENT_CLINIC_OR_DEPARTMENT_OTHER): Payer: Self-pay

## 2021-05-10 ENCOUNTER — Ambulatory Visit (INDEPENDENT_AMBULATORY_CARE_PROVIDER_SITE_OTHER): Payer: No Typology Code available for payment source | Admitting: Family Medicine

## 2021-05-10 ENCOUNTER — Other Ambulatory Visit: Payer: Self-pay

## 2021-05-10 VITALS — BP 131/89 | HR 90 | Temp 97.8°F | Ht 64.0 in | Wt 178.0 lb

## 2021-05-10 DIAGNOSIS — Z9189 Other specified personal risk factors, not elsewhere classified: Secondary | ICD-10-CM

## 2021-05-10 DIAGNOSIS — E559 Vitamin D deficiency, unspecified: Secondary | ICD-10-CM

## 2021-05-10 DIAGNOSIS — E669 Obesity, unspecified: Secondary | ICD-10-CM | POA: Diagnosis not present

## 2021-05-10 DIAGNOSIS — E66811 Obesity, class 1: Secondary | ICD-10-CM

## 2021-05-10 DIAGNOSIS — Z6830 Body mass index (BMI) 30.0-30.9, adult: Secondary | ICD-10-CM | POA: Diagnosis not present

## 2021-05-10 MED ORDER — VITAMIN D (ERGOCALCIFEROL) 1.25 MG (50000 UNIT) PO CAPS
ORAL_CAPSULE | ORAL | 0 refills | Status: DC
  Filled 2021-05-10: qty 8, fill #0

## 2021-05-10 MED ORDER — VITAMIN D (ERGOCALCIFEROL) 1.25 MG (50000 UNIT) PO CAPS
ORAL_CAPSULE | ORAL | 0 refills | Status: DC
Start: 2021-05-10 — End: 2021-05-31
  Filled 2021-05-10: qty 8, 28d supply, fill #0

## 2021-05-11 ENCOUNTER — Ambulatory Visit (INDEPENDENT_AMBULATORY_CARE_PROVIDER_SITE_OTHER): Payer: No Typology Code available for payment source | Admitting: Family Medicine

## 2021-05-11 ENCOUNTER — Encounter: Payer: Self-pay | Admitting: Family Medicine

## 2021-05-11 ENCOUNTER — Other Ambulatory Visit (HOSPITAL_BASED_OUTPATIENT_CLINIC_OR_DEPARTMENT_OTHER): Payer: Self-pay

## 2021-05-11 VITALS — BP 126/87 | HR 82 | Temp 98.7°F | Ht 64.0 in | Wt 183.1 lb

## 2021-05-11 DIAGNOSIS — G43709 Chronic migraine without aura, not intractable, without status migrainosus: Secondary | ICD-10-CM | POA: Diagnosis not present

## 2021-05-11 DIAGNOSIS — J0140 Acute pansinusitis, unspecified: Secondary | ICD-10-CM

## 2021-05-11 MED ORDER — TOPIRAMATE 100 MG PO TABS
100.0000 mg | ORAL_TABLET | Freq: Every day | ORAL | 1 refills | Status: DC
  Filled 2021-05-11: qty 90, 90d supply, fill #0

## 2021-05-11 MED ORDER — PREDNISONE 20 MG PO TABS
20.0000 mg | ORAL_TABLET | Freq: Every day | ORAL | 0 refills | Status: DC
  Filled 2021-05-11: qty 5, 5d supply, fill #0

## 2021-05-11 MED ORDER — AMOXICILLIN-POT CLAVULANATE 875-125 MG PO TABS
1.0000 | ORAL_TABLET | Freq: Two times a day (BID) | ORAL | 0 refills | Status: AC
  Filled 2021-05-11: qty 14, 7d supply, fill #0

## 2021-05-11 MED ORDER — SUMATRIPTAN SUCCINATE 25 MG PO TABS
25.0000 mg | ORAL_TABLET | ORAL | 2 refills | Status: DC | PRN
  Filled 2021-05-11: qty 9, 30d supply, fill #0

## 2021-05-11 NOTE — Progress Notes (Signed)
Acute Office Visit  Subjective:    Patient ID: Alison Davies, female    DOB: 06-Aug-1985, 36 y.o.   MRN: [id number]  Chief Complaint  Patient presents with   Facial Pain   Headache    Headache   Patient is in today for sinus pressure and headaches.   Patient states she had COVID about a month ago. She did receive antivirals and felt mostly better, but has continued to have extreme sinus pressure (right sided) that is triggering her to have horrible headaches/migraines. States she is having multiple migraines a day and they can wake her up sometimes. She usually has her migraines well controlled on Topamax until this. For the sinus symptoms she has been using saline nasal rinses, Mucinex DM, essential oils, tylenol, and ibuprofen. She denies any vision changes, chest pain, shortness of breath, wheezing, cough, fevers.     Past Medical History:  Diagnosis Date   Anxiety    Hyperlipidemia    Leg edema    Migraines    Vaginal Pap smear, abnormal 06/12/2019   LGSIL +HPV    Past Surgical History:  Procedure Laterality Date   COLPOSCOPY N/A    CRYOTHERAPY  12/2020   cervix    Family History  Problem Relation Age of Onset   Anxiety disorder Mother    Diabetes Father    Obesity Father     Social History   Socioeconomic History   Marital status: Single    Spouse name: Not on file   Number of children: Not on file   Years of education: Not on file   Highest education level: Not on file  Occupational History   Occupation: Rad Engineer, production: [employer]  Tobacco Use   Smoking status: Never   Smokeless tobacco: Never  Vaping Use   Vaping Use: Never used  Substance and Sexual Activity   Alcohol use: Yes    Comment: occasional   Drug use: Never   Sexual activity: Yes    Birth control/protection: Pill  Other Topics Concern   Not on file  Social History Narrative   Not on file   Social Determinants of Health   Financial Resource Strain: Not on file  Food  Insecurity: Not on file  Transportation Needs: Not on file  Physical Activity: Not on file  Stress: Not on file  Social Connections: Not on file  Intimate Partner Violence: Not on file    Outpatient Medications Prior to Visit  Medication Sig Dispense Refill   ALPRAZolam (XANAX) 0.25 MG tablet TAKE 1 TABLET (0.25 MG TOTAL) BY MOUTH AT BEDTIME AS NEEDED FOR ANXIETY. 30 tablet 0   Biotin 3 MG TABS Take 2 tablets by mouth daily.     Cyanocobalamin (VITAMIN B-12 CR) 1500 MCG TBCR Take 2 tablets by mouth 2 (two) times daily.     Melatonin 5 MG CHEW Chew 2 capsules by mouth at bedtime as needed.     Multiple Vitamin (MULTIVITAMIN WITH MINERALS) TABS tablet Take 1 tablet by mouth daily.     norethindrone (MICRONOR) 0.35 MG tablet TAKE 1 TABLET BY MOUTH ONCE DAILY 84 tablet 3   Semaglutide-Weight Management 1.7 MG/0.75ML SOAJ INJECT 1.7 MG INTO THE SKIN ONCE A WEEK. 9 mL 0   sertraline (ZOLOFT) 100 MG tablet Take 1&1/2 tablets (150 mg total) by mouth daily. 135 tablet 1   SUMAtriptan (IMITREX) 25 MG tablet Take 1 tablet (25 mg total) by mouth every 2 (two) hours as needed  for migraine. May repeat in 2 hours if headache persists or recurs. 10 tablet 2   topiramate (TOPAMAX) 100 MG tablet Take 1 tablet (100 mg total) by mouth at bedtime. 90 tablet 1   Turmeric (QC TUMERIC COMPLEX PO) Take by mouth.     vitamin C (ASCORBIC ACID) 250 MG tablet Take 250 mg by mouth daily.     Vitamin D, Ergocalciferol, (DRISDOL) 1.25 MG (50000 UNIT) CAPS capsule TAKE 1 CAPSULE (50,000 UNITS TOTAL) BY MOUTH 2 (TWO) TIMES A WEEK. 8 capsule 0   No facility-administered medications prior to visit.    No Known Allergies  Review of Systems All review of systems negative except what is listed in the HPI      Objective:    Physical Exam Vitals reviewed.  Constitutional:      Appearance: Normal appearance. She is well-developed.  HENT:     Head: Normocephalic and atraumatic.     Right Ear: Tympanic membrane  normal.     Left Ear: Tympanic membrane normal.  Cardiovascular:     Rate and Rhythm: Normal rate and regular rhythm.     Heart sounds: Normal heart sounds.  Pulmonary:     Effort: Pulmonary effort is normal.     Breath sounds: Normal breath sounds.  Musculoskeletal:     Cervical back: Normal range of motion and neck supple. No rigidity.  Lymphadenopathy:     Cervical: No cervical adenopathy.  Skin:    General: Skin is warm and dry.  Neurological:     Mental Status: She is alert and oriented to person, place, and time.  Psychiatric:        Mood and Affect: Mood normal.        Speech: Speech normal.        Behavior: Behavior normal.    BP 126/87 (BP Location: Left Arm, Patient Position: Sitting, Cuff Size: Normal)    Pulse 82    Temp 98.7 F (37.1 C) (Oral)    Ht '5\' 4"'  (1.626 m)    Wt 183 lb 1.3 oz (83 kg)    SpO2 99%    BMI 31.43 kg/m  Wt Readings from Last 3 Encounters:  05/11/21 183 lb 1.3 oz (83 kg)  05/10/21 178 lb (80.7 kg)  03/17/21 179 lb (81.2 kg)    There are no preventive care reminders to display for this patient.  There are no preventive care reminders to display for this patient.   Lab Results  Component Value Date   TSH 1.850 07/26/2017   Lab Results  Component Value Date   WBC 7.1 06/12/2019   HGB 14.0 06/12/2019   HCT 41.0 06/12/2019   MCV 89.7 06/12/2019   PLT 299.0 06/12/2019   Lab Results  Component Value Date   NA 141 02/24/2021   K 4.4 02/24/2021   CO2 19 (L) 02/24/2021   GLUCOSE 79 02/24/2021   BUN 10 02/24/2021   CREATININE 0.66 02/24/2021   BILITOT 0.6 02/24/2021   ALKPHOS 90 02/24/2021   AST 15 02/24/2021   ALT 17 02/24/2021   PROT 7.5 02/24/2021   ALBUMIN 4.7 02/24/2021   CALCIUM 9.5 02/24/2021   EGFR 119 02/24/2021   Lab Results  Component Value Date   CHOL 268 (H) 02/24/2021   Lab Results  Component Value Date   HDL 58 02/24/2021   Lab Results  Component Value Date   LDLCALC 189 (H) 02/24/2021   Lab Results   Component Value Date   TRIG 118  02/24/2021   Lab Results  Component Value Date   CHOLHDL 4.0 10/27/2020   Lab Results  Component Value Date   HGBA1C 4.8 02/24/2021       Assessment & Plan:   1. Chronic migraine without aura without status migrainosus, not intractable Refilling migraine regimen. Prednisone for sinus infection should help as well.  - topiramate (TOPAMAX) 100 MG tablet; Take 1 tablet (100 mg total) by mouth at bedtime.  Dispense: 90 tablet; Refill: 1 - SUMAtriptan (IMITREX) 25 MG tablet; Take 1 tablet (25 mg total) by mouth every 2 (two) hours as needed for migraine. May repeat in 2 hours if headache persists or recurs.  Dispense: 10 tablet; Refill: 2 - predniSONE (DELTASONE) 20 MG tablet; Take 1 tablet (20 mg total) by mouth daily with breakfast.  Dispense: 5 tablet; Refill: 0  2. Acute non-recurrent pansinusitis Given duration, treating with Augmentin and adding some prednisone. Continue supportive measures including rest, hydration, humidifier use, steam showers, warm compresses to sinuses, warm liquids with lemon and honey, and over-the-counter cough, cold, and analgesics as needed.  Patient aware of signs/symptoms requiring further/urgent evaluation.   - amoxicillin-clavulanate (AUGMENTIN) 875-125 MG tablet; Take 1 tablet by mouth 2 (two) times daily for 7 days.  Dispense: 14 tablet; Refill: 0 - predniSONE (DELTASONE) 20 MG tablet; Take 1 tablet (20 mg total) by mouth daily with breakfast.  Dispense: 5 tablet; Refill: 0   Follow-up if symptoms worsen or fail to improve.   Terrilyn Saver, NP

## 2021-05-11 NOTE — Patient Instructions (Signed)
Antibiotics and prednisone sent in. Migraine meds refilled. Continue supportive measures including rest, hydration, humidifier use, steam showers, warm compresses to sinuses, warm liquids with lemon and honey, and over-the-counter cough, cold, and analgesics as needed.    Please contact office for follow-up if symptoms do not improve or worsen. Seek emergency care if symptoms become severe.

## 2021-05-12 NOTE — Progress Notes (Signed)
Chief Complaint:   OBESITY Alison Davies is here to discuss her progress with her obesity treatment plan along with follow-up of her obesity related diagnoses. Alison Davies is on the Category 2 Plan and keeping a food journal and adhering to recommended goals of 1400 calories and 105 grams protein and states she is following her eating plan approximately 80% of the time. Alison Davies states she is hiking, cardio, and ab workout 30-45 minutes 1-2 times per week.  Today's visit was #: 57 Starting weight: 199 lbs Starting date: 07/26/2017 Today's weight: 178 lbs Today's date: 05/10/2021 Total lbs lost to date: 21 Total lbs lost since last in-office visit: 1  Interim History: Alison Davies's last weigh-in was mid November.  She had COVID and had a video visit in December with [place], our PA.  This is her 1st OV with me.  Over the holidays, she did PC/Saxon and tried to continue to be active/exercise.  She denies issues with plan or concerns today  -Upon review of chart, she had FBW about 2 mo ago showing elevated F.I., LDL;  and a low Vit D level   Subjective:   1. Vitamin D deficiency Pt has only taken Rx Vit D twice a week for 2 weeks, but is now consistent with taking as prescribed.  2. At risk for impaired metabolic function Alison Davies is at increased risk for impaired metabolic function due to pre-diabetes and obesity.    Assessment/Plan:  No orders of the defined types were placed in this encounter.   Medications Discontinued During This Encounter  Medication Reason   Vitamin D, Ergocalciferol, (DRISDOL) 1.25 MG (50000 UNIT) CAPS capsule Reorder   Vitamin D, Ergocalciferol, (DRISDOL) 1.25 MG (50000 UNIT) CAPS capsule      Meds ordered this encounter  Medications   DISCONTD: Vitamin D, Ergocalciferol, (DRISDOL) 1.25 MG (50000 UNIT) CAPS capsule    Sig: TAKE 1 CAPSULE (50,000 UNITS TOTAL) BY MOUTH 2 (TWO) TIMES A WEEK.    Dispense:  8 capsule    Refill:  0   Vitamin D, Ergocalciferol, (DRISDOL) 1.25 MG  (50000 UNIT) CAPS capsule    Sig: TAKE 1 CAPSULE (50,000 UNITS TOTAL) BY MOUTH 2 (TWO) TIMES A WEEK.    Dispense:  8 capsule    Refill:  0     1. Vitamin D deficiency Low Vitamin D level contributes to fatigue and are associated with obesity, breast, and colon cancer. She agrees to continue to take prescription Vitamin D 50,000 IU twice a week and will follow-up for routine testing of Vitamin D, at least 2-3 times per year to avoid over-replacement. I recommend pt take written dose consistently for 2-3 months before next lab check.  Refill- Vitamin D, Ergocalciferol, (DRISDOL) 1.25 MG (50000 UNIT) CAPS capsule; TAKE 1 CAPSULE (50,000 UNITS TOTAL) BY MOUTH 2 (TWO) TIMES A WEEK.  Dispense: 8 capsule; Refill: 0   2. At risk for impaired metabolic function Due to Alison Davies's current state of health and medical condition(s), she is at a significantly higher risk for impaired metabolic function.   At least 8.5 minutes was spent on counseling Alison Davies about these concerns today.  This places the patient at a much greater risk to subsequently develop cardio-pulmonary conditions that can negatively affect the patient's quality of life.  I stressed the importance of reversing these risks factors.  The initial goal is to lose at least 5-10% of starting weight to help reduce risk factors.  Counseling:  Intensive lifestyle modifications discussed with Alison Davies as  the most appropriate first line treatment.  she will continue to work on diet, exercise, and weight loss efforts.  We will continue to reassess these conditions on a fairly regular basis in an attempt to decrease the patient's overall morbidity and mortality.    3. Obesity, current BMI 30.7  Alison Davies is currently in the action stage of change. As such, her goal is to continue with weight loss efforts. She has agreed to the Category 2 Plan or keeping a food journal and adhering to recommended goals of roughly 13-1400 calories and 90-100+ grams protein.   Exercise  goals:  Her goal is to reach 150+ minutes of moderate intensity exercise per week.  - We discussed her borderline Bp today, but per pt, it's normally much better controlled. Upon review of chart, this appears to be consistent with what I am seeing in pt's EMR  Behavioral modification strategies: increasing lean protein intake, decreasing simple carbohydrates, and planning for success.  Alison Davies has agreed to follow-up with our clinic in 3 weeks with Linus Orn, PA-C. She was informed of the importance of frequent follow-up visits to maximize her success with intensive lifestyle modifications for her multiple health conditions.   Objective:   Blood pressure 131/89, pulse 90, temperature 97.8 F (36.6 C), height 5\' 4"  (1.626 m), weight 178 lb (80.7 kg), SpO2 99 %. Body mass index is 30.55 kg/m.  General: Cooperative, alert, well developed, in no acute distress. HEENT: Conjunctivae and lids unremarkable. Cardiovascular: Regular rhythm.  Lungs: Normal work of breathing. Neurologic: No focal deficits.   Lab Results  Component Value Date   CREATININE 0.66 02/24/2021   BUN 10 02/24/2021   NA 141 02/24/2021   K 4.4 02/24/2021   CL 106 02/24/2021   CO2 19 (L) 02/24/2021   Lab Results  Component Value Date   ALT 17 02/24/2021   AST 15 02/24/2021   ALKPHOS 90 02/24/2021   BILITOT 0.6 02/24/2021   Lab Results  Component Value Date   HGBA1C 4.8 02/24/2021   HGBA1C 4.9 03/24/2020   HGBA1C 4.8 10/23/2019   HGBA1C 4.8 06/13/2019   HGBA1C 5.0 11/13/2018   Lab Results  Component Value Date   INSULIN 12.9 02/24/2021   INSULIN 12.6 10/27/2020   INSULIN 18.6 03/24/2020   INSULIN 11.1 10/23/2019   INSULIN 15.4 06/13/2019   Lab Results  Component Value Date   TSH 1.850 07/26/2017   Lab Results  Component Value Date   CHOL 268 (H) 02/24/2021   HDL 58 02/24/2021   LDLCALC 189 (H) 02/24/2021   TRIG 118 02/24/2021   CHOLHDL 4.0 10/27/2020   Lab Results  Component Value Date   VD25OH  18.2 (L) 02/24/2021   VD25OH 27.6 (L) 10/27/2020   VD25OH 21.1 (L) 03/24/2020   Lab Results  Component Value Date   WBC 7.1 06/12/2019   HGB 14.0 06/12/2019   HCT 41.0 06/12/2019   MCV 89.7 06/12/2019   PLT 299.0 06/12/2019    Attestation Statements:   Reviewed by clinician on day of visit: allergies, medications, problem list, medical history, surgical history, family history, social history, and previous encounter notes.  Coral Ceo, CMA, am acting as transcriptionist for [employer], DO.  I have reviewed the above documentation for accuracy and completeness, and I agree with the above. Marjory Sneddon, D.O.  The [place] was signed into law in 2016 which includes the topic of electronic health records.  This provides immediate access to information in  MyChart.  This includes consultation notes, operative notes, office notes, lab results and pathology reports.  If you have any questions about what you read please let us know at your next visit so we can discuss your concerns and take corrective action if need be.  We are right here with you.

## 2021-05-20 ENCOUNTER — Encounter: Payer: Self-pay | Admitting: Family Medicine

## 2021-05-31 ENCOUNTER — Encounter (INDEPENDENT_AMBULATORY_CARE_PROVIDER_SITE_OTHER): Payer: Self-pay | Admitting: Physician Assistant

## 2021-05-31 ENCOUNTER — Other Ambulatory Visit: Payer: Self-pay

## 2021-05-31 ENCOUNTER — Other Ambulatory Visit (HOSPITAL_BASED_OUTPATIENT_CLINIC_OR_DEPARTMENT_OTHER): Payer: Self-pay

## 2021-05-31 ENCOUNTER — Ambulatory Visit (INDEPENDENT_AMBULATORY_CARE_PROVIDER_SITE_OTHER): Payer: No Typology Code available for payment source | Admitting: Physician Assistant

## 2021-05-31 VITALS — BP 111/70 | HR 84 | Temp 97.9°F | Ht 64.0 in | Wt 178.0 lb

## 2021-05-31 DIAGNOSIS — E669 Obesity, unspecified: Secondary | ICD-10-CM | POA: Diagnosis not present

## 2021-05-31 DIAGNOSIS — E559 Vitamin D deficiency, unspecified: Secondary | ICD-10-CM | POA: Diagnosis not present

## 2021-05-31 DIAGNOSIS — Z9189 Other specified personal risk factors, not elsewhere classified: Secondary | ICD-10-CM | POA: Diagnosis not present

## 2021-05-31 DIAGNOSIS — Z6830 Body mass index (BMI) 30.0-30.9, adult: Secondary | ICD-10-CM | POA: Diagnosis not present

## 2021-05-31 MED ORDER — VITAMIN D (ERGOCALCIFEROL) 1.25 MG (50000 UNIT) PO CAPS
ORAL_CAPSULE | ORAL | 0 refills | Status: DC
  Filled 2021-05-31 – 2021-06-02 (×2): qty 8, 28d supply, fill #0

## 2021-05-31 MED ORDER — SEMAGLUTIDE-WEIGHT MANAGEMENT 1.7 MG/0.75ML ~~LOC~~ SOAJ
1.7000 mg | SUBCUTANEOUS | 0 refills | Status: DC
  Filled 2021-05-31: qty 9, 84d supply, fill #0
  Filled 2021-06-02 – 2021-06-22 (×2): qty 3, 28d supply, fill #0

## 2021-05-31 NOTE — Progress Notes (Signed)
Chief Complaint:   OBESITY Alison Davies is here to discuss her progress with her obesity treatment plan along with follow-up of her obesity related diagnoses. Alison Davies is on the Category 2 Plan or keeping a food journal and adhering to recommended goals of 1300-1400 calories and 90-100+ grams of protein daily and states she is following her eating plan approximately 60% of the time. Alison Davies states she is doing gym exercise for 60 minutes 3 times per week.  Today's visit was #: 51 Starting weight: 199 lbs Starting date: 07/26/2017 Today's weight: 178 lbs Today's date: 05/31/2021 Total lbs lost to date: 21 Total lbs lost since last in-office visit: 0  Interim History: Alison Davies has been stressed with a recent breakup. She is testing for her CT certification tomorrow. She states that she has been prepping on Sundays, but she got away from it after her breakup.  Subjective:   1. Vitamin D deficiency Alison Davies is on Vit D twice weekly. She is taking it as prescribed.  2. At risk for osteoporosis Alison Davies is at higher risk of osteopenia and osteoporosis due to Vitamin D deficiency.   Assessment/Plan:   1. Vitamin D deficiency Low Vitamin D level contributes to fatigue and are associated with obesity, breast, and colon cancer. We will refill prescription Vitamin D for 1 month. Alison Davies will follow-up for routine testing of Vitamin D, at least 2-3 times per year to avoid over-replacement.  - Vitamin D, Ergocalciferol, (DRISDOL) 1.25 MG (50000 UNIT) CAPS capsule; TAKE 1 CAPSULE (50,000 UNITS TOTAL) BY MOUTH 2 (TWO) TIMES A WEEK.  Dispense: 8 capsule; Refill: 0  2. At risk for osteoporosis Alison Davies was given approximately 15 minutes of osteoporosis prevention counseling today. Alison Davies is at risk for osteopenia and osteoporosis due to her Vitamin D deficiency. She was encouraged to take her Vitamin D and follow her higher calcium diet and increase strengthening exercise to help strengthen her bones and decrease her risk  of osteopenia and osteoporosis.  3. Obesity, current BMI 30.54 Alison Davies is currently in the action stage of change. As such, her goal is to continue with weight loss efforts. She has agreed to keeping a food journal and adhering to recommended goals of 1300-1400 calories and 90-100 grams of protein daily.   We discussed various medication options to help Alison Davies with her weight loss efforts and we both agreed to continue Wegovy 1.7 mg, and  we will refill for 1 month.  - Semaglutide-Weight Management 1.7 MG/0.75ML SOAJ; INJECT 1.7 MG INTO THE SKIN ONCE A WEEK.  Dispense: 9 mL; Refill: 0  Exercise goals: As is.  Behavioral modification strategies: no skipping meals, planning for success, and keeping a strict food journal.  Alison Davies has agreed to follow-up with our clinic in 3 weeks. She was informed of the importance of frequent follow-up visits to maximize her success with intensive lifestyle modifications for her multiple health conditions.   Objective:   Blood pressure 111/70, pulse 84, temperature 97.9 F (36.6 C), height 5\' 4"  (1.626 m), weight 178 lb (80.7 kg), SpO2 98 %. Body mass index is 30.55 kg/m.  General: Cooperative, alert, well developed, in no acute distress. HEENT: Conjunctivae and lids unremarkable. Cardiovascular: Regular rhythm.  Lungs: Normal work of breathing. Neurologic: No focal deficits.   Lab Results  Component Value Date   CREATININE 0.66 02/24/2021   BUN 10 02/24/2021   NA 141 02/24/2021   K 4.4 02/24/2021   CL 106 02/24/2021   CO2 19 (L) 02/24/2021  Lab Results  Component Value Date   ALT 17 02/24/2021   AST 15 02/24/2021   ALKPHOS 90 02/24/2021   BILITOT 0.6 02/24/2021   Lab Results  Component Value Date   HGBA1C 4.8 02/24/2021   HGBA1C 4.9 03/24/2020   HGBA1C 4.8 10/23/2019   HGBA1C 4.8 06/13/2019   HGBA1C 5.0 11/13/2018   Lab Results  Component Value Date   INSULIN 12.9 02/24/2021   INSULIN 12.6 10/27/2020   INSULIN 18.6 03/24/2020    INSULIN 11.1 10/23/2019   INSULIN 15.4 06/13/2019   Lab Results  Component Value Date   TSH 1.850 07/26/2017   Lab Results  Component Value Date   CHOL 268 (H) 02/24/2021   HDL 58 02/24/2021   LDLCALC 189 (H) 02/24/2021   TRIG 118 02/24/2021   CHOLHDL 4.0 10/27/2020   Lab Results  Component Value Date   VD25OH 18.2 (L) 02/24/2021   VD25OH 27.6 (L) 10/27/2020   VD25OH 21.1 (L) 03/24/2020   Lab Results  Component Value Date   WBC 7.1 06/12/2019   HGB 14.0 06/12/2019   HCT 41.0 06/12/2019   MCV 89.7 06/12/2019   PLT 299.0 06/12/2019   No results found for: IRON, TIBC, FERRITIN  Attestation Statements:   Reviewed by clinician on day of visit: allergies, medications, problem list, medical history, surgical history, family history, social history, and previous encounter notes.   Wilhemena Durie, am acting as transcriptionist for [employer], PA-C.  I have reviewed the above documentation for accuracy and completeness, and I agree with the above. Abby Potash, PA-C

## 2021-06-02 ENCOUNTER — Other Ambulatory Visit (HOSPITAL_BASED_OUTPATIENT_CLINIC_OR_DEPARTMENT_OTHER): Payer: Self-pay

## 2021-06-07 ENCOUNTER — Telehealth: Payer: Self-pay

## 2021-06-07 NOTE — Telephone Encounter (Signed)
FMLA documentation faxed to [employer] on 06/04/21 (with confirmation)

## 2021-06-14 ENCOUNTER — Other Ambulatory Visit (HOSPITAL_BASED_OUTPATIENT_CLINIC_OR_DEPARTMENT_OTHER): Payer: Self-pay

## 2021-06-22 ENCOUNTER — Other Ambulatory Visit: Payer: Self-pay

## 2021-06-22 ENCOUNTER — Encounter (INDEPENDENT_AMBULATORY_CARE_PROVIDER_SITE_OTHER): Payer: Self-pay | Admitting: Physician Assistant

## 2021-06-22 ENCOUNTER — Ambulatory Visit (INDEPENDENT_AMBULATORY_CARE_PROVIDER_SITE_OTHER): Payer: No Typology Code available for payment source | Admitting: Physician Assistant

## 2021-06-22 ENCOUNTER — Other Ambulatory Visit (HOSPITAL_BASED_OUTPATIENT_CLINIC_OR_DEPARTMENT_OTHER): Payer: Self-pay

## 2021-06-22 VITALS — BP 130/86 | HR 84 | Temp 98.2°F | Ht 64.0 in | Wt 177.0 lb

## 2021-06-22 DIAGNOSIS — E669 Obesity, unspecified: Secondary | ICD-10-CM | POA: Diagnosis not present

## 2021-06-22 DIAGNOSIS — E559 Vitamin D deficiency, unspecified: Secondary | ICD-10-CM | POA: Diagnosis not present

## 2021-06-22 DIAGNOSIS — Z6830 Body mass index (BMI) 30.0-30.9, adult: Secondary | ICD-10-CM | POA: Diagnosis not present

## 2021-06-22 NOTE — Progress Notes (Signed)
Chief Complaint:   OBESITY Alison Davies is here to discuss her progress with her obesity treatment plan along with follow-up of her obesity related diagnoses. Alison Davies is on keeping a food journal and adhering to recommended goals of 1300-1400 calories and 90-100 grams protein and states she is following her eating plan approximately 90% of the time. Alison Davies states she is going to the gym 60 minutes 3 times per week.  Today's visit was #: 41 Starting weight: 199 lbs Starting date: 07/26/2017 Today's weight: 177 lbs Today's date: 06/22/2021 Total lbs lost to date: 22 Total lbs lost since last in-office visit: 1  Interim History: Alison Davies is working out consistently and following the plan better. She notes wanting to eat more carbs, especially on her workout days. She skipped 2 doses of Wegovy and states that her hunger has increased.  Subjective:   1. Vitamin D deficiency Pt has been taking Vit D as prescribed for the last 2 weeks.  Assessment/Plan:   1. Vitamin D deficiency Low Vitamin D level contributes to fatigue and are associated with obesity, breast, and colon cancer. She agrees to continue to take prescription Vitamin D 50,000 IU twice a week and will follow-up for routine testing of Vitamin D, at least 2-3 times per year to avoid over-replacement. Recheck level next month.  2. Obesity, current BMI 30.54 Alison Davies is currently in the action stage of change. As such, her goal is to continue with weight loss efforts. She has agreed to keeping a food journal and adhering to recommended goals of 1400-1500 calories and 120 grams protein.   Exercise goals:  As is  Behavioral modification strategies: increasing lean protein intake and keeping a strict food journal.  Alison Davies has agreed to follow-up with our clinic in 3 weeks. She was informed of the importance of frequent follow-up visits to maximize her success with intensive lifestyle modifications for her multiple health conditions.   Objective:    Blood pressure 130/86, pulse 84, temperature 98.2 F (36.8 C), height 5\' 4"  (1.626 m), weight 177 lb (80.3 kg), SpO2 98 %. Body mass index is 30.38 kg/m.  General: Cooperative, alert, well developed, in no acute distress. HEENT: Conjunctivae and lids unremarkable. Cardiovascular: Regular rhythm.  Lungs: Normal work of breathing. Neurologic: No focal deficits.   Lab Results  Component Value Date   CREATININE 0.66 02/24/2021   BUN 10 02/24/2021   NA 141 02/24/2021   K 4.4 02/24/2021   CL 106 02/24/2021   CO2 19 (L) 02/24/2021   Lab Results  Component Value Date   ALT 17 02/24/2021   AST 15 02/24/2021   ALKPHOS 90 02/24/2021   BILITOT 0.6 02/24/2021   Lab Results  Component Value Date   HGBA1C 4.8 02/24/2021   HGBA1C 4.9 03/24/2020   HGBA1C 4.8 10/23/2019   HGBA1C 4.8 06/13/2019   HGBA1C 5.0 11/13/2018   Lab Results  Component Value Date   INSULIN 12.9 02/24/2021   INSULIN 12.6 10/27/2020   INSULIN 18.6 03/24/2020   INSULIN 11.1 10/23/2019   INSULIN 15.4 06/13/2019   Lab Results  Component Value Date   TSH 1.850 07/26/2017   Lab Results  Component Value Date   CHOL 268 (H) 02/24/2021   HDL 58 02/24/2021   LDLCALC 189 (H) 02/24/2021   TRIG 118 02/24/2021   CHOLHDL 4.0 10/27/2020   Lab Results  Component Value Date   VD25OH 18.2 (L) 02/24/2021   VD25OH 27.6 (L) 10/27/2020   VD25OH 21.1 (L) 03/24/2020  Lab Results  Component Value Date   WBC 7.1 06/12/2019   HGB 14.0 06/12/2019   HCT 41.0 06/12/2019   MCV 89.7 06/12/2019   PLT 299.0 06/12/2019    Attestation Statements:   Reviewed by clinician on day of visit: allergies, medications, problem list, medical history, surgical history, family history, social history, and previous encounter notes.  Time spent on visit including pre-visit chart review and post-visit care and charting was 35 minutes.   Coral Ceo, CMA, am acting as transcriptionist for [employer], PA-C.  I have  reviewed the above documentation for accuracy and completeness, and I agree with the above. Abby Potash, PA-C

## 2021-06-30 ENCOUNTER — Encounter (INDEPENDENT_AMBULATORY_CARE_PROVIDER_SITE_OTHER): Payer: Self-pay | Admitting: Physician Assistant

## 2021-07-01 NOTE — Telephone Encounter (Signed)
Alison Davies 

## 2021-07-19 ENCOUNTER — Encounter: Payer: Self-pay | Admitting: Family Medicine

## 2021-07-19 ENCOUNTER — Other Ambulatory Visit (HOSPITAL_BASED_OUTPATIENT_CLINIC_OR_DEPARTMENT_OTHER): Payer: Self-pay

## 2021-07-19 ENCOUNTER — Ambulatory Visit (INDEPENDENT_AMBULATORY_CARE_PROVIDER_SITE_OTHER): Payer: No Typology Code available for payment source | Admitting: Family Medicine

## 2021-07-19 ENCOUNTER — Other Ambulatory Visit: Payer: Self-pay

## 2021-07-19 VITALS — BP 94/64 | HR 78 | Temp 98.1°F | Ht 64.0 in | Wt 178.0 lb

## 2021-07-19 DIAGNOSIS — G43709 Chronic migraine without aura, not intractable, without status migrainosus: Secondary | ICD-10-CM

## 2021-07-19 DIAGNOSIS — F411 Generalized anxiety disorder: Secondary | ICD-10-CM

## 2021-07-19 DIAGNOSIS — Z113 Encounter for screening for infections with a predominantly sexual mode of transmission: Secondary | ICD-10-CM

## 2021-07-19 DIAGNOSIS — E785 Hyperlipidemia, unspecified: Secondary | ICD-10-CM | POA: Diagnosis not present

## 2021-07-19 DIAGNOSIS — Z0000 Encounter for general adult medical examination without abnormal findings: Secondary | ICD-10-CM

## 2021-07-19 MED ORDER — TOPIRAMATE 100 MG PO TABS
100.0000 mg | ORAL_TABLET | Freq: Every day | ORAL | 1 refills | Status: DC
Start: 2021-07-19 — End: 2022-01-24
  Filled 2021-07-19 – 2021-09-12 (×2): qty 90, 90d supply, fill #0
  Filled 2021-12-08: qty 90, 90d supply, fill #1

## 2021-07-19 MED ORDER — SERTRALINE HCL 100 MG PO TABS
150.0000 mg | ORAL_TABLET | Freq: Every day | ORAL | 1 refills | Status: DC
  Filled 2021-07-19: qty 45, 30d supply, fill #0
  Filled 2021-07-19: qty 90, 60d supply, fill #0
  Filled 2022-03-11: qty 135, 90d supply, fill #1

## 2021-07-19 NOTE — Assessment & Plan Note (Signed)
Well adult ?Orders Placed This Encounter  ?Procedures  ?? Chlamydia/Neisseria Gonorrhoeae RNA,TMA,Urogenital  ?? Trichomonas vaginalis, RNA  ?? COMPLETE METABOLIC PANEL WITH GFR  ?? CBC with Differential  ?? Lipid Panel w/reflex Direct LDL  ?? TSH  ?? HgB A1c  ?? RPR  ?? HIV antibody (with reflex)  ?Screenings: Per lab orders.  Sees GYN for Paps. ?Immunizations: Up-to-date ?Anticipatory guidance/risk factor reduction: Recommendations per AVS. ?

## 2021-07-19 NOTE — Assessment & Plan Note (Signed)
Stable with current dose of topiramate and Imitrex as needed.  She is cutting back some on topiramate. ?

## 2021-07-19 NOTE — Patient Instructions (Signed)
Preventive Care 61-34 Years Old, Female ?Preventive care refers to lifestyle choices and visits with your health care provider that can promote health and wellness. Preventive care visits are also called wellness exams. ?What can I expect for my preventive care visit? ?Counseling ?During your preventive care visit, your health care provider may ask about your: ?Medical history, including: ?Past medical problems. ?Family medical history. ?Pregnancy history. ?Current health, including: ?Menstrual cycle. ?Method of birth control. ?Emotional well-being. ?Home life and relationship well-being. ?Sexual activity and sexual health. ?Lifestyle, including: ?Alcohol, nicotine or tobacco, and drug use. ?Access to firearms. ?Diet, exercise, and sleep habits. ?Work and work Statistician. ?Sunscreen use. ?Safety issues such as seatbelt and bike helmet use. ?Physical exam ?Your health care provider may check your: ?Height and weight. These may be used to calculate your BMI (body mass index). BMI is a measurement that tells if you are at a healthy weight. ?Waist circumference. This measures the distance around your waistline. This measurement also tells if you are at a healthy weight and may help predict your risk of certain diseases, such as type 2 diabetes and high blood pressure. ?Heart rate and blood pressure. ?Body temperature. ?Skin for abnormal spots. ?What immunizations do I need? ?Vaccines are usually given at various ages, according to a schedule. Your health care provider will recommend vaccines for you based on your age, medical history, and lifestyle or other factors, such as travel or where you work. ?What tests do I need? ?Screening ?Your health care provider may recommend screening tests for certain conditions. This may include: ?Pelvic exam and Pap test. ?Lipid and cholesterol levels. ?Diabetes screening. This is done by checking your blood sugar (glucose) after you have not eaten for a while (fasting). ?Hepatitis B  test. ?Hepatitis C test. ?HIV (human immunodeficiency virus) test. ?STI (sexually transmitted infection) testing, if you are at risk. ?BRCA-related cancer screening. This may be done if you have a family history of breast, ovarian, tubal, or peritoneal cancers. ?Talk with your health care provider about your test results, treatment options, and if necessary, the need for more tests. ?Follow these instructions at home: ?Eating and drinking ? ?Eat a healthy diet that includes fresh fruits and vegetables, whole grains, lean protein, and low-fat dairy products. ?Take vitamin and mineral supplements as recommended by your health care provider. ?Do not drink alcohol if: ?Your health care provider tells you not to drink. ?You are pregnant, may be pregnant, or are planning to become pregnant. ?If you drink alcohol: ?Limit how much you have to 0-1 drink a day. ?Know how much alcohol is in your drink. In the [place] one drink equals one 12 oz bottle of beer (355 mL), one 5 oz glass of wine (148 mL), or one 1? oz glass of hard liquor (44 mL). ?Lifestyle ?Brush your teeth every morning and night with fluoride toothpaste. Floss one time each day. ?Exercise for at least 30 minutes 5 or more days each week. ?Do not use any products that contain nicotine or tobacco. These products include cigarettes, chewing tobacco, and vaping devices, such as e-cigarettes. If you need help quitting, ask your health care provider. ?Do not use drugs. ?If you are sexually active, practice safe sex. Use a condom or other form of protection to prevent STIs. ?If you do not wish to become pregnant, use a form of birth control. If you plan to become pregnant, see your health care provider for a prepregnancy visit. ?Find healthy ways to manage stress, such as: ?Meditation, yoga,  or listening to music. ?Journaling. ?Talking to a trusted person. ?Spending time with friends and family. ?Minimize exposure to UV radiation to reduce your risk of skin  cancer. ?Safety ?Always wear your seat belt while driving or riding in a vehicle. ?Do not drive: ?If you have been drinking alcohol. Do not ride with someone who has been drinking. ?If you have been using any mind-altering substances or drugs. ?While texting. ?When you are tired or distracted. ?Wear a helmet and other protective equipment during sports activities. ?If you have firearms in your house, make sure you follow all gun safety procedures. ?Seek help if you have been physically or sexually abused. ?What's next? ?Go to your health care provider once a year for an annual wellness visit. ?Ask your health care provider how often you should have your eyes and teeth checked. ?Stay up to date on all vaccines. ?This information is not intended to replace advice given to you by your health care provider. Make sure you discuss any questions you have with your health care provider. ?Document Revised: 10/14/2020 Document Reviewed: 10/14/2020 ?Elsevier Patient Education ? [employer]. ? ?

## 2021-07-19 NOTE — Progress Notes (Signed)
?Alison Davies - 36 y.o. female MRN [id number]  Date of birth: [birth year] ? ?Subjective ?Chief Complaint  ?Patient presents with  ? Annual Exam  ? ? ?HPI ?Alison Davies is a 36 year old female here today for annual exam.  Reports she is doing well.  Continues to work with healthy weight and wellness for management of weight.  She is taking [place] with good results.  She is exercising regularly and feels like diet is doing well. ? ?Migraines have been fairly well controlled with topiramate.  She is tapering down on this some. ? ?She is a non-smoker.  She does consume alcohol occasionally. ? ?She would like to have STI screening completed. ? ?Review of Systems  ?Constitutional:  Negative for chills, fever, malaise/fatigue and weight loss.  ?HENT:  Negative for congestion, ear pain and sore throat.   ?Eyes:  Negative for blurred vision, double vision and pain.  ?Respiratory:  Negative for cough and shortness of breath.   ?Cardiovascular:  Negative for chest pain and palpitations.  ?Gastrointestinal:  Negative for abdominal pain, blood in stool, constipation, heartburn and nausea.  ?Genitourinary:  Negative for dysuria and urgency.  ?Musculoskeletal:  Negative for joint pain and myalgias.  ?Neurological:  Negative for dizziness and headaches.  ?Endo/Heme/Allergies:  Does not bruise/bleed easily.  ?Psychiatric/Behavioral:  Negative for depression. The patient is not nervous/anxious and does not have insomnia.   ? ? ?No Known Allergies ? ?Past Medical History:  ?Diagnosis Date  ? Anxiety   ? Hyperlipidemia   ? Leg edema   ? Migraines   ? Vaginal Pap smear, abnormal 06/12/2019  ? LGSIL +HPV  ? ? ?Past Surgical History:  ?Procedure Laterality Date  ? COLPOSCOPY N/A   ? CRYOTHERAPY  12/2020  ? cervix  ? ? ?Social History  ? ?Socioeconomic History  ? Marital status: Single  ?  Spouse name: Not on file  ? Number of children: Not on file  ? Years of education: Not on file  ? Highest education level: Not on file  ?Occupational History  ?  Occupation: [employer]  ?  Employer: [employer]  ?Tobacco Use  ? Smoking status: Never  ? Smokeless tobacco: Never  ?Vaping Use  ? Vaping Use: Never used  ?Substance and Sexual Activity  ? Alcohol use: Yes  ?  Comment: occasional  ? Drug use: Never  ? Sexual activity: Yes  ?  Birth control/protection: Pill  ?Other Topics Concern  ? Not on file  ?Social History Narrative  ? Not on file  ? ?Social Determinants of Health  ? ?Financial Resource Strain: Not on file  ?Food Insecurity: Not on file  ?Transportation Needs: Not on file  ?Physical Activity: Not on file  ?Stress: Not on file  ?Social Connections: Not on file  ? ? ?Family History  ?Problem Relation Age of Onset  ? Anxiety disorder Mother   ? Diabetes Father   ? Obesity Father   ? ? ?Health Maintenance  ?Topic Date Due  ? PAP SMEAR-Modifier  10/10/2023  ? TETANUS/TDAP  03/04/2028  ? INFLUENZA VACCINE  Completed  ? COVID-19 Vaccine  Completed  ? Hepatitis C Screening  Completed  ? HIV Screening  Completed  ? HPV VACCINES  Aged Out  ? ? ? ?----------------------------------------------------------------------------------------------------------------------------------------------------------------------------------------------------------------- ?Physical Exam ?BP 94/64 (BP Location: Left Arm, Patient Position: Sitting, Cuff Size: Normal)   Pulse 78   Temp 98.1 ?F (36.7 ?C) (Oral)   Ht 5\' 4"  (1.626 m)   Wt 178 lb (80.7 kg)  SpO2 98%   BMI 30.55 kg/m?  ? ?Physical Exam ?Constitutional:   ?   General: She is not in acute distress. ?HENT:  ?   Head: Normocephalic and atraumatic.  ?   Right Ear: Tympanic membrane and ear canal normal.  ?   Left Ear: Tympanic membrane and ear canal normal.  ?   Nose: Nose normal.  ?Eyes:  ?   General: No scleral icterus. ?   Conjunctiva/sclera: Conjunctivae normal.  ?Neck:  ?   Thyroid: No thyromegaly.  ?Cardiovascular:  ?   Rate and Rhythm: Normal rate and regular rhythm.  ?   Heart sounds: Normal heart sounds.  ?Pulmonary:  ?    Effort: Pulmonary effort is normal.  ?   Breath sounds: Normal breath sounds.  ?Abdominal:  ?   General: Bowel sounds are normal. There is no distension.  ?   Palpations: Abdomen is soft.  ?   Tenderness: There is no abdominal tenderness. There is no guarding.  ?Musculoskeletal:     ?   General: Normal range of motion.  ?   Cervical back: Normal range of motion and neck supple.  ?Lymphadenopathy:  ?   Cervical: No cervical adenopathy.  ?Skin: ?   General: Skin is warm and dry.  ?   Findings: No rash.  ?Neurological:  ?   General: No focal deficit present.  ?   Mental Status: She is alert and oriented to person, place, and time.  ?   Cranial Nerves: No cranial nerve deficit.  ?   Coordination: Coordination normal.  ?Psychiatric:     ?   Mood and Affect: Mood normal.     ?   Behavior: Behavior normal.  ? ? ?------------------------------------------------------------------------------------------------------------------------------------------------------------------------------------------------------------------- ?Assessment and Plan ? ?Well adult exam ?Well adult ?Orders Placed This Encounter  ?Procedures  ? Chlamydia/Neisseria Gonorrhoeae RNA,TMA,Urogenital  ? Trichomonas vaginalis, RNA  ? COMPLETE METABOLIC PANEL WITH GFR  ? CBC with Differential  ? Lipid Panel w/reflex Direct LDL  ? TSH  ? HgB A1c  ? RPR  ? HIV antibody (with reflex)  ?Screenings: Per lab orders.  Sees GYN for Paps. ?Immunizations: Up-to-date ?Anticipatory guidance/risk factor reduction: Recommendations per AVS. ? ?Migraine headache ?Stable with current dose of topiramate and Imitrex as needed.  She is cutting back some on topiramate. ? ? ?Meds ordered this encounter  ?Medications  ? topiramate (TOPAMAX) 100 MG tablet  ?  Sig: Take 1 tablet (100 mg total) by mouth at bedtime.  ?  Dispense:  90 tablet  ?  Refill:  1  ? sertraline (ZOLOFT) 100 MG tablet  ?  Sig: Take 1&1/2 tablets (150 mg total) by mouth daily.  ?  Dispense:  135 tablet  ?   Refill:  1  ? ? ?Return in about 6 months (around 01/19/2022) for Migraines/GAD. ? ? ? ?This visit occurred during the SARS-CoV-2 public health emergency.  Safety protocols were in place, including screening questions prior to the visit, additional usage of staff PPE, and extensive cleaning of exam room while observing appropriate contact time as indicated for disinfecting solutions.  ? ?

## 2021-07-20 ENCOUNTER — Other Ambulatory Visit (HOSPITAL_BASED_OUTPATIENT_CLINIC_OR_DEPARTMENT_OTHER): Payer: Self-pay

## 2021-07-21 ENCOUNTER — Encounter (INDEPENDENT_AMBULATORY_CARE_PROVIDER_SITE_OTHER): Payer: Self-pay | Admitting: Physician Assistant

## 2021-07-21 ENCOUNTER — Ambulatory Visit (INDEPENDENT_AMBULATORY_CARE_PROVIDER_SITE_OTHER): Payer: No Typology Code available for payment source | Admitting: Physician Assistant

## 2021-07-21 ENCOUNTER — Other Ambulatory Visit (HOSPITAL_BASED_OUTPATIENT_CLINIC_OR_DEPARTMENT_OTHER): Payer: Self-pay

## 2021-07-21 ENCOUNTER — Other Ambulatory Visit: Payer: Self-pay

## 2021-07-21 VITALS — BP 114/75 | HR 66 | Temp 98.1°F | Ht 64.0 in | Wt 172.0 lb

## 2021-07-21 DIAGNOSIS — Z9189 Other specified personal risk factors, not elsewhere classified: Secondary | ICD-10-CM

## 2021-07-21 DIAGNOSIS — E559 Vitamin D deficiency, unspecified: Secondary | ICD-10-CM

## 2021-07-21 DIAGNOSIS — E7849 Other hyperlipidemia: Secondary | ICD-10-CM | POA: Diagnosis not present

## 2021-07-21 DIAGNOSIS — E669 Obesity, unspecified: Secondary | ICD-10-CM

## 2021-07-21 DIAGNOSIS — Z6829 Body mass index (BMI) 29.0-29.9, adult: Secondary | ICD-10-CM

## 2021-07-21 LAB — RPR: RPR Ser Ql: NONREACTIVE

## 2021-07-21 LAB — CBC WITH DIFFERENTIAL/PLATELET
Absolute Monocytes: 722 cells/uL (ref 200–950)
Basophils Absolute: 9 cells/uL (ref 0–200)
Basophils Relative: 0.1 %
Eosinophils Absolute: 264 cells/uL (ref 15–500)
Eosinophils Relative: 3 %
HCT: 42.4 % (ref 35.0–45.0)
Hemoglobin: 14.1 g/dL (ref 11.7–15.5)
Lymphs Abs: 2895 cells/uL (ref 850–3900)
MCH: 30.9 pg (ref 27.0–33.0)
MCHC: 33.3 g/dL (ref 32.0–36.0)
MCV: 93 fL (ref 80.0–100.0)
MPV: 10.3 fL (ref 7.5–12.5)
Monocytes Relative: 8.2 %
Neutro Abs: 4910 cells/uL (ref 1500–7800)
Neutrophils Relative %: 55.8 %
Platelets: 322 10*3/uL (ref 140–400)
RBC: 4.56 10*6/uL (ref 3.80–5.10)
RDW: 11.8 % (ref 11.0–15.0)
Total Lymphocyte: 32.9 %
WBC: 8.8 10*3/uL (ref 3.8–10.8)

## 2021-07-21 LAB — COMPLETE METABOLIC PANEL WITH GFR
AG Ratio: 1.7 (calc) (ref 1.0–2.5)
ALT: 11 U/L (ref 6–29)
AST: 12 U/L (ref 10–30)
Albumin: 4.4 g/dL (ref 3.6–5.1)
Alkaline phosphatase (APISO): 57 U/L (ref 31–125)
BUN: 16 mg/dL (ref 7–25)
CO2: 21 mmol/L (ref 20–32)
Calcium: 9.6 mg/dL (ref 8.6–10.2)
Chloride: 108 mmol/L (ref 98–110)
Creat: 0.84 mg/dL (ref 0.50–0.97)
Globulin: 2.6 g/dL (calc) (ref 1.9–3.7)
Glucose, Bld: 80 mg/dL (ref 65–99)
Potassium: 3.8 mmol/L (ref 3.5–5.3)
Sodium: 139 mmol/L (ref 135–146)
Total Bilirubin: 0.6 mg/dL (ref 0.2–1.2)
Total Protein: 7 g/dL (ref 6.1–8.1)
eGFR: 93 mL/min/{1.73_m2} (ref >=60)

## 2021-07-21 LAB — CHLAMYDIA/NEISSERIA GONORRHOEAE RNA,TMA,UROGENTIAL
C. trachomatis RNA, TMA: NOT DETECTED
N. gonorrhoeae RNA, TMA: NOT DETECTED

## 2021-07-21 LAB — HEMOGLOBIN A1C
Hgb A1c MFr Bld: 4.6 % of total Hgb (ref <5.7)
Mean Plasma Glucose: 85 mg/dL
eAG (mmol/L): 4.7 mmol/L

## 2021-07-21 LAB — LIPID PANEL W/REFLEX DIRECT LDL
Cholesterol: 200 mg/dL — ABNORMAL HIGH (ref <200)
HDL: 50 mg/dL (ref >=50)
LDL Cholesterol (Calc): 129 mg/dL (calc) — ABNORMAL HIGH
Non-HDL Cholesterol (Calc): 150 mg/dL (calc) — ABNORMAL HIGH (ref <130)
Total CHOL/HDL Ratio: 4 (calc) (ref <5.0)
Triglycerides: 103 mg/dL (ref <150)

## 2021-07-21 LAB — TSH: TSH: 2.36 mIU/L

## 2021-07-21 LAB — TRICHOMONAS VAGINALIS, PROBE AMP: Trichomonas vaginalis RNA: NOT DETECTED

## 2021-07-21 LAB — HIV ANTIBODY (ROUTINE TESTING W REFLEX): HIV 1&2 Ab, 4th Generation: NONREACTIVE

## 2021-07-21 MED ORDER — VITAMIN D (ERGOCALCIFEROL) 1.25 MG (50000 UNIT) PO CAPS
ORAL_CAPSULE | ORAL | 0 refills | Status: DC
  Filled 2021-07-21: qty 8, 28d supply, fill #0

## 2021-07-21 MED ORDER — SEMAGLUTIDE-WEIGHT MANAGEMENT 1.7 MG/0.75ML ~~LOC~~ SOAJ
1.7000 mg | SUBCUTANEOUS | 0 refills | Status: DC
  Filled 2021-07-21: qty 3, 28d supply, fill #0
  Filled 2021-08-24: qty 3, 28d supply, fill #1

## 2021-07-24 NOTE — Progress Notes (Signed)
? ? ? ?Chief Complaint:  ? ?OBESITY ?Alison Davies is here to discuss her progress with her obesity treatment plan along with follow-up of her obesity related diagnoses. Alison Davies is on keeping a food journal and adhering to recommended goals of 1600 calories and 100 grams of protein and states she is following her eating plan approximately 90% of the time. Alison Davies states she is doing gym exercise for 60-90 minutes 3 times per week. ? ?Today's visit was #: 54 ?Starting weight: 199 lbs ?Starting date: 07/26/2017 ?Today's weight: 172 lbs ?Today's date: 07/21/2021 ?Total lbs lost to date: 27 lbs ?Total lbs lost since last in-office visit: 5 lbs ? ?Interim History: Alison Davies hired a Physiological scientist that checks in daily with her about her goals. She is averaging about 120 grams of protein and 1600-1700 calories daily. Saturdays she tends not to eat as much because she is sleeping a lot.  ? ?Subjective:  ? ?1. Vitamin D deficiency ?Alison Davies is currently on Vitamin D twice weekly.  ? ?2. Hyperlipidemia ?Alison Davies's last lipid panel improved but not at goal. She is not on medications currently.  ? ?3. At risk for heart disease ?Alison Davies is at higher than average risk for cardiovascular disease due to obesity.  ? ?Assessment/Plan:  ? ?1. Vitamin D deficiency ?Low Vitamin D level contributes to fatigue and are associated with obesity, breast, and colon cancer.We will refill prescription Vitamin D 50,000 IU every week for 1 month with no refills and Dola will follow-up for routine testing of Vitamin D, at least 2-3 times per year to avoid over-replacement. We will check Vitamin D at next office visit.  ? ?- Vitamin D, Ergocalciferol, (DRISDOL) 1.25 MG (50000 UNIT) CAPS capsule; TAKE 1 CAPSULE (50,000 UNITS TOTAL) BY MOUTH 2 (TWO) TIMES A WEEK.  Dispense: 8 capsule; Refill: 0 ? ?2. Hyperlipidemia ?Cardiovascular risk and specific lipid/LDL goals reviewed.  Shawndrika will continue with plan and exercise. We discussed several lifestyle modifications today and  Saphia will continue to work on diet, exercise and weight loss efforts. Orders and follow up as documented in patient record.  ? ?Counseling ?Intensive lifestyle modifications are the first line treatment for this issue. ?Dietary changes: Increase soluble fiber. Decrease simple carbohydrates. ?Exercise changes: Moderate to vigorous-intensity aerobic activity 150 minutes per week if tolerated. ?Lipid-lowering medications: see documented in medical record. ? ?3. At risk for heart disease ?Alison Davies was given approximately 15 minutes of coronary artery disease prevention counseling today. She is 36 y.o. female and has risk factors for heart disease including obesity. We discussed intensive lifestyle modifications today with an emphasis on specific weight loss instructions and strategies. ? ?Repetitive spaced learning was employed today to elicit superior memory formation and behavioral change.   ? ?4. Obesity, current BMI 29.51 ?Alison Davies is currently in the action stage of change. As such, her goal is to continue with weight loss efforts. She has agreed to keeping a food journal and adhering to recommended goals of 1400-1500 calories and 120 grams of protein daily.  ? ?We will refill Wegovy 1.7 mg for 1 month with no refills.  ? ?- Semaglutide-Weight Management 1.7 MG/0.75ML SOAJ; INJECT 1.7 MG INTO THE SKIN ONCE A WEEK.  Dispense: 9 mL; Refill: 0 ? ?Exercise goals:  As is. ? ?Behavioral modification strategies: meal planning and cooking strategies and planning for success. ? ?Alison Davies has agreed to follow-up with our clinic in 4 weeks. She was informed of the importance of frequent follow-up visits to maximize her success with intensive lifestyle  modifications for her multiple health conditions.  ? ?Objective:  ? ?Blood pressure 114/75, pulse 66, temperature 98.1 ?F (36.7 ?C), height 5\' 4"  (1.626 m), weight 172 lb (78 kg), SpO2 98 %. ?Body mass index is 29.52 kg/m?. ? ?General: Cooperative, alert, well developed, in no acute  distress. ?HEENT: Conjunctivae and lids unremarkable. ?Cardiovascular: Regular rhythm.  ?Lungs: Normal work of breathing. ?Neurologic: No focal deficits.  ? ?Lab Results  ?Component Value Date  ? CREATININE 0.84 07/19/2021  ? BUN 16 07/19/2021  ? NA 139 07/19/2021  ? K 3.8 07/19/2021  ? CL 108 07/19/2021  ? CO2 21 07/19/2021  ? ?Lab Results  ?Component Value Date  ? ALT 11 07/19/2021  ? AST 12 07/19/2021  ? ALKPHOS 90 02/24/2021  ? BILITOT 0.6 07/19/2021  ? ?Lab Results  ?Component Value Date  ? HGBA1C 4.6 07/19/2021  ? HGBA1C 4.8 02/24/2021  ? HGBA1C 4.9 03/24/2020  ? HGBA1C 4.8 10/23/2019  ? HGBA1C 4.8 06/13/2019  ? ?Lab Results  ?Component Value Date  ? INSULIN 12.9 02/24/2021  ? INSULIN 12.6 10/27/2020  ? INSULIN 18.6 03/24/2020  ? INSULIN 11.1 10/23/2019  ? INSULIN 15.4 06/13/2019  ? ?Lab Results  ?Component Value Date  ? TSH 2.36 07/19/2021  ? ?Lab Results  ?Component Value Date  ? CHOL 200 (H) 07/19/2021  ? HDL 50 07/19/2021  ? LDLCALC 129 (H) 07/19/2021  ? TRIG 103 07/19/2021  ? CHOLHDL 4.0 07/19/2021  ? ?Lab Results  ?Component Value Date  ? VD25OH 18.2 (L) 02/24/2021  ? VD25OH 27.6 (L) 10/27/2020  ? VD25OH 21.1 (L) 03/24/2020  ? ?Lab Results  ?Component Value Date  ? WBC 8.8 07/19/2021  ? HGB 14.1 07/19/2021  ? HCT 42.4 07/19/2021  ? MCV 93.0 07/19/2021  ? PLT 322 07/19/2021  ? ?No results found for: IRON, TIBC, FERRITIN ? ?Attestation Statements:  ? ?Reviewed by clinician on day of visit: allergies, medications, problem list, medical history, surgical history, family history, social history, and previous encounter notes. ? ?I, Tonye Pearson, am acting as Location manager for [employer], PA-C. ? ?I have reviewed the above documentation for accuracy and completeness, and I agree with the above. Abby Potash, PA-C ? ?

## 2021-08-05 ENCOUNTER — Encounter: Payer: Self-pay | Admitting: General Practice

## 2021-08-18 ENCOUNTER — Ambulatory Visit (INDEPENDENT_AMBULATORY_CARE_PROVIDER_SITE_OTHER): Payer: No Typology Code available for payment source | Admitting: Physician Assistant

## 2021-08-18 ENCOUNTER — Other Ambulatory Visit (HOSPITAL_BASED_OUTPATIENT_CLINIC_OR_DEPARTMENT_OTHER): Payer: Self-pay

## 2021-08-18 ENCOUNTER — Encounter (INDEPENDENT_AMBULATORY_CARE_PROVIDER_SITE_OTHER): Payer: Self-pay | Admitting: Physician Assistant

## 2021-08-18 VITALS — BP 117/74 | Ht 64.0 in | Wt 171.0 lb

## 2021-08-18 DIAGNOSIS — E559 Vitamin D deficiency, unspecified: Secondary | ICD-10-CM | POA: Diagnosis not present

## 2021-08-18 DIAGNOSIS — E7849 Other hyperlipidemia: Secondary | ICD-10-CM | POA: Diagnosis not present

## 2021-08-18 DIAGNOSIS — Z9189 Other specified personal risk factors, not elsewhere classified: Secondary | ICD-10-CM

## 2021-08-18 DIAGNOSIS — E8881 Metabolic syndrome: Secondary | ICD-10-CM

## 2021-08-18 DIAGNOSIS — E669 Obesity, unspecified: Secondary | ICD-10-CM | POA: Diagnosis not present

## 2021-08-18 DIAGNOSIS — Z6829 Body mass index (BMI) 29.0-29.9, adult: Secondary | ICD-10-CM

## 2021-08-18 MED ORDER — VITAMIN D (ERGOCALCIFEROL) 1.25 MG (50000 UNIT) PO CAPS
ORAL_CAPSULE | ORAL | 0 refills | Status: DC
  Filled 2021-08-18: qty 8, 30d supply, fill #0

## 2021-08-24 ENCOUNTER — Other Ambulatory Visit (HOSPITAL_BASED_OUTPATIENT_CLINIC_OR_DEPARTMENT_OTHER): Payer: Self-pay

## 2021-08-25 ENCOUNTER — Other Ambulatory Visit (HOSPITAL_BASED_OUTPATIENT_CLINIC_OR_DEPARTMENT_OTHER): Payer: Self-pay

## 2021-08-27 NOTE — Progress Notes (Signed)
? ? ? ?Chief Complaint:  ? ?OBESITY ?Alison Davies is here to discuss her progress with her obesity treatment plan along with follow-up of her obesity related diagnoses. Alison Davies is on keeping a food journal and adhering to recommended goals of 1400-1500 calories and 120 grams of protein daily and states she is following her eating plan approximately 75% of the time. Alison Davies states she is lifting weights for 1 hour 3 times per week, and doing cardio for 30 minutes 2 times per week. ? ?Today's visit was #: 64 ?Starting weight: 199 lbs ?Starting date: 07/26/2017 ?Today's weight: 171 lbs ?Today's date: 08/18/2021 ?Total lbs lost to date: 18 ?Total lbs lost since last in-office visit: 1 ? ?Interim History: Alison Davies reports more stress in the last few weeks and not sleeping as much as she should. She notes stress eating and comfort eating have been an issue. 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 Exercising is helping to decrease stress level.  ? ?Subjective:  ? ?1. Hyperlipidemia ?Alison Davies's last lipid panel with her PCP has improved but not yet at goal. She is exercising regularly. I discussed labs with the patient today. ? ?2. Insulin resistance ?Alison Davies's last A1c was 4.6 with her PCP this month. She denies polyphagia, and she is exercising regularly. I discussed labs with the patient today. ? ?3. Vitamin D deficiency ?Alison Davies is on Vitamin D, and she denies nausea, vomiting, or muscle weakness.  ? ?4. At risk for diabetes mellitus ?Alison Davies is at higher than average risk for developing diabetes due to her obesity. ? ?Assessment/Plan:  ? ?1. Hyperlipidemia ?Cardiovascular risk and specific lipid/LDL goals reviewed.  We discussed several lifestyle modifications today and Alison Davies will continue her meal plan and exercise. Orders and follow up as documented in patient record.  ? ?Counseling ?Intensive lifestyle modifications are the first line treatment for this issue. ?Dietary changes: Increase soluble fiber. Decrease simple  carbohydrates. ?Exercise changes: Moderate to vigorous-intensity aerobic activity 150 minutes per week if tolerated. ?Lipid-lowering medications: see documented in medical record. ? ?2. Insulin resistance ?Alison Davies will continue her meal plan and exercise to help decrease the risk of diabetes. Alison Davies agreed to follow-up with [name] as directed to closely monitor her progress. ? ?3. Vitamin D deficiency ?We will refill prescription Vitamin D for 1 month. Alison Davies will follow-up for routine testing of Vitamin D, at least 2-3 times per year to avoid over-replacement. ? ?- Vitamin D, Ergocalciferol, (DRISDOL) 1.25 MG (50000 UNIT) CAPS capsule; TAKE 1 CAPSULE (50,000 UNITS TOTAL) BY MOUTH 2 (TWO) TIMES A WEEK.  Dispense: 8 capsule; Refill: 0 ? ?4. At risk for diabetes mellitus ?Alison Davies was given approximately 15 minutes of diabetic education and counseling today. We discussed intensive lifestyle modifications today with an emphasis on weight loss as well as increasing exercise and decreasing simple carbohydrates in her diet. We also reviewed medication options with an emphasis on risk versus benefits of those discussed. ? ?Repetitive spaced learning was employed today to elicit superior memory formation and behavioral change. ? ?5. Obesity, current BMI 29.34 ?Alison Davies is currently in the action stage of change. As such, her goal is to continue with weight loss efforts. She has agreed to keeping a food journal and adhering to recommended goals of 1400-1500 calories and 120 grams of protein daily.  ? ?Exercise goals: As is. ? ?Behavioral modification strategies: emotional eating strategies, avoiding temptations, planning for success, and keeping a strict food journal. ? ?Alison Davies has agreed to follow-up with our clinic in 4 weeks.  She was informed of the importance of frequent follow-up visits to maximize her success with intensive lifestyle modifications for her multiple health conditions.  ? ?Objective:  ? ?Blood pressure 117/74, height 5'  4" (1.626 m), weight 171 lb (77.6 kg). ?Body mass index is 29.35 kg/m?. ? ?General: Cooperative, alert, well developed, in no acute distress. ?HEENT: Conjunctivae and lids unremarkable. ?Cardiovascular: Regular rhythm.  ?Lungs: Normal work of breathing. ?Neurologic: No focal deficits.  ? ?Lab Results  ?Component Value Date  ? CREATININE 0.84 07/19/2021  ? BUN 16 07/19/2021  ? NA 139 07/19/2021  ? K 3.8 07/19/2021  ? CL 108 07/19/2021  ? CO2 21 07/19/2021  ? ?Lab Results  ?Component Value Date  ? ALT 11 07/19/2021  ? AST 12 07/19/2021  ? ALKPHOS 90 02/24/2021  ? BILITOT 0.6 07/19/2021  ? ?Lab Results  ?Component Value Date  ? HGBA1C 4.6 07/19/2021  ? HGBA1C 4.8 02/24/2021  ? HGBA1C 4.9 03/24/2020  ? HGBA1C 4.8 10/23/2019  ? HGBA1C 4.8 06/13/2019  ? ?Lab Results  ?Component Value Date  ? INSULIN 12.9 02/24/2021  ? INSULIN 12.6 10/27/2020  ? INSULIN 18.6 03/24/2020  ? INSULIN 11.1 10/23/2019  ? INSULIN 15.4 06/13/2019  ? ?Lab Results  ?Component Value Date  ? TSH 2.36 07/19/2021  ? ?Lab Results  ?Component Value Date  ? CHOL 200 (H) 07/19/2021  ? HDL 50 07/19/2021  ? LDLCALC 129 (H) 07/19/2021  ? TRIG 103 07/19/2021  ? CHOLHDL 4.0 07/19/2021  ? ?Lab Results  ?Component Value Date  ? VD25OH 18.2 (L) 02/24/2021  ? VD25OH 27.6 (L) 10/27/2020  ? VD25OH 21.1 (L) 03/24/2020  ? ?Lab Results  ?Component Value Date  ? WBC 8.8 07/19/2021  ? HGB 14.1 07/19/2021  ? HCT 42.4 07/19/2021  ? MCV 93.0 07/19/2021  ? PLT 322 07/19/2021  ? ?No results found for: IRON, TIBC, FERRITIN ? ?Attestation Statements:  ? ?Reviewed by clinician on day of visit: allergies, medications, problem list, medical history, surgical history, family history, social history, and previous encounter notes. ? ? ?I, Trixie Dredge, am acting as transcriptionist for Abby Potash, PA-C. ? ?I have reviewed the above documentation for accuracy and completeness, and I agree with the above. Abby Potash, PA-C ? ? ?

## 2021-09-13 ENCOUNTER — Other Ambulatory Visit (HOSPITAL_BASED_OUTPATIENT_CLINIC_OR_DEPARTMENT_OTHER): Payer: Self-pay

## 2021-09-15 ENCOUNTER — Other Ambulatory Visit (HOSPITAL_BASED_OUTPATIENT_CLINIC_OR_DEPARTMENT_OTHER): Payer: Self-pay

## 2021-09-15 ENCOUNTER — Ambulatory Visit (INDEPENDENT_AMBULATORY_CARE_PROVIDER_SITE_OTHER): Payer: No Typology Code available for payment source | Admitting: Family Medicine

## 2021-09-15 ENCOUNTER — Encounter (INDEPENDENT_AMBULATORY_CARE_PROVIDER_SITE_OTHER): Payer: Self-pay | Admitting: Family Medicine

## 2021-09-15 VITALS — BP 126/78 | HR 71 | Temp 97.9°F | Ht 64.0 in | Wt 173.0 lb

## 2021-09-15 DIAGNOSIS — Z6829 Body mass index (BMI) 29.0-29.9, adult: Secondary | ICD-10-CM | POA: Diagnosis not present

## 2021-09-15 DIAGNOSIS — E669 Obesity, unspecified: Secondary | ICD-10-CM | POA: Diagnosis not present

## 2021-09-15 DIAGNOSIS — Z9189 Other specified personal risk factors, not elsewhere classified: Secondary | ICD-10-CM

## 2021-09-15 DIAGNOSIS — E559 Vitamin D deficiency, unspecified: Secondary | ICD-10-CM

## 2021-09-15 MED ORDER — SEMAGLUTIDE-WEIGHT MANAGEMENT 1.7 MG/0.75ML ~~LOC~~ SOAJ
1.7000 mg | SUBCUTANEOUS | 0 refills | Status: DC
  Filled 2021-09-15: qty 3, 28d supply, fill #0
  Filled 2021-09-20: qty 9, 84d supply, fill #0

## 2021-09-17 ENCOUNTER — Other Ambulatory Visit (HOSPITAL_BASED_OUTPATIENT_CLINIC_OR_DEPARTMENT_OTHER): Payer: Self-pay

## 2021-09-20 ENCOUNTER — Encounter (INDEPENDENT_AMBULATORY_CARE_PROVIDER_SITE_OTHER): Payer: Self-pay | Admitting: Family Medicine

## 2021-09-20 ENCOUNTER — Other Ambulatory Visit (HOSPITAL_BASED_OUTPATIENT_CLINIC_OR_DEPARTMENT_OTHER): Payer: Self-pay

## 2021-09-20 ENCOUNTER — Encounter (HOSPITAL_BASED_OUTPATIENT_CLINIC_OR_DEPARTMENT_OTHER): Payer: Self-pay | Admitting: Pharmacist

## 2021-09-20 NOTE — Telephone Encounter (Signed)
Prior authorization has already been started for [facility]. Will notify patient and provider once a response is received.

## 2021-09-21 ENCOUNTER — Other Ambulatory Visit (HOSPITAL_BASED_OUTPATIENT_CLINIC_OR_DEPARTMENT_OTHER): Payer: Self-pay

## 2021-09-22 ENCOUNTER — Encounter (INDEPENDENT_AMBULATORY_CARE_PROVIDER_SITE_OTHER): Payer: Self-pay

## 2021-09-22 ENCOUNTER — Other Ambulatory Visit (HOSPITAL_BASED_OUTPATIENT_CLINIC_OR_DEPARTMENT_OTHER): Payer: Self-pay

## 2021-09-22 ENCOUNTER — Telehealth (INDEPENDENT_AMBULATORY_CARE_PROVIDER_SITE_OTHER): Payer: Self-pay | Admitting: Family Medicine

## 2021-09-22 NOTE — Telephone Encounter (Addendum)
Dr. Dalbert Garnet - Prior authorization approved for [facility]. Effective: 09/21/2021 - 09/21/2022. Patient sent approval message via mychart. I

## 2021-09-29 NOTE — Progress Notes (Signed)
Chief Complaint:   OBESITY Alison Davies is here to discuss her progress with her obesity treatment plan along with follow-up of her obesity related diagnoses. Alison Davies is on keeping a food journal and adhering to recommended goals of 1400-1500 calories and 120 grams of protein daily and states she is following her eating plan approximately 80% of the time. Alison Davies states she is cardio for 60 minutes 3-4 times per week.  Today's visit was #: 78 Starting weight: 199 lbs Starting date: 07/26/2017 Today's weight: 173 lbs Today's date: 09/15/2021 Total lbs lost to date: 26 Total lbs lost since last in-office visit: 0  Interim History: Alison Davies has done  some celebrations eating recently, and she is up a bit of weight. She has already gotten back on track. She is working on increasing her water intake and getting her protein in. She will be going on vacation soon.  Subjective:   1. Vitamin D deficiency Alison Davies is on Vitamin D prescription, but she struggles to remember to take it regularly.  2. At risk for osteoporosis Alison Davies is at higher risk of osteopenia and osteoporosis due to Vitamin D deficiency.   Assessment/Plan:   1. Vitamin D deficiency Low Vitamin D level contributes to fatigue and are associated with obesity, breast, and colon cancer. K[place]ile agrees to continue prescription Vitamin D 50,000 IU twice weekly and  we will recheck labs in 1-2 months. She will follow-up for routine testing of Vitamin D, at least 2-3 times per year to avoid over-replacement.  2. At risk for osteoporosis Alison Davies was given approximately 15 minutes of osteoporosis prevention counseling today. Alison Davies is at risk for osteopenia and osteoporosis due to her Vitamin D deficiency. She was encouraged to take her Vit D and follow her higher calcium diet and increase strengthening exercise to help strengthen her bones and decrease her risk of osteopenia and osteoporosis.  3. Obesity, Current BMI 29.7 Alison Davies is currently in the  action stage of change. As such, her goal is to continue with weight loss efforts. She has agreed to keeping a food journal and adhering to recommended goals of 1400-1500 calories and 120 grams of protein daily.   We discussed various medication options to help Alison Davies with her weight loss efforts and we both agreed to continue Wegovy at 1.7 mg, and we will refill for 1 month.  - Semaglutide-Weight Management 1.7 MG/0.75ML SOAJ; INJECT 1.7 MG INTO THE SKIN ONCE A WEEK.  Dispense: 9 mL; Refill: 0  Exercise goals: As is.   Behavioral modification strategies: increasing water intake, no skipping meals, and travel eating strategies.  Alison Davies has agreed to follow-up with our clinic in 4 weeks. She was informed of the importance of frequent follow-up visits to maximize her success with intensive lifestyle modifications for her multiple health conditions.   Objective:   Blood pressure 126/78, pulse 71, temperature 97.9 F (36.6 C), height 5\' 4"  (1.626 m), weight 173 lb (78.5 kg), SpO2 96 %. Body mass index is 29.7 kg/m.  General: Cooperative, alert, well developed, in no acute distress. HEENT: Conjunctivae and lids unremarkable. Cardiovascular: Regular rhythm.  Lungs: Normal work of breathing. Neurologic: No focal deficits.   Lab Results  Component Value Date   CREATININE 0.84 07/19/2021   BUN 16 07/19/2021   NA 139 07/19/2021   K 3.8 07/19/2021   CL 108 07/19/2021   CO2 21 07/19/2021   Lab Results  Component Value Date   ALT 11 07/19/2021   AST 12 07/19/2021  ALKPHOS 90 02/24/2021   BILITOT 0.6 07/19/2021   Lab Results  Component Value Date   HGBA1C 4.6 07/19/2021   HGBA1C 4.8 02/24/2021   HGBA1C 4.9 03/24/2020   HGBA1C 4.8 10/23/2019   HGBA1C 4.8 06/13/2019   Lab Results  Component Value Date   INSULIN 12.9 02/24/2021   INSULIN 12.6 10/27/2020   INSULIN 18.6 03/24/2020   INSULIN 11.1 10/23/2019   INSULIN 15.4 06/13/2019   Lab Results  Component Value Date   TSH 2.36  07/19/2021   Lab Results  Component Value Date   CHOL 200 (H) 07/19/2021   HDL 50 07/19/2021   LDLCALC 129 (H) 07/19/2021   TRIG 103 07/19/2021   CHOLHDL 4.0 07/19/2021   Lab Results  Component Value Date   VD25OH 18.2 (L) 02/24/2021   VD25OH 27.6 (L) 10/27/2020   VD25OH 21.1 (L) 03/24/2020   Lab Results  Component Value Date   WBC 8.8 07/19/2021   HGB 14.1 07/19/2021   HCT 42.4 07/19/2021   MCV 93.0 07/19/2021   PLT 322 07/19/2021   No results found for: IRON, TIBC, FERRITIN  Attestation Statements:   Reviewed by clinician on day of visit: allergies, medications, problem list, medical history, surgical history, family history, social history, and previous encounter notes.   I, Trixie Dredge, am acting as transcriptionist for Dennard Nip, MD.  I have reviewed the above documentation for accuracy and completeness, and I agree with the above. -  Dennard Nip, MD

## 2021-10-13 ENCOUNTER — Ambulatory Visit (INDEPENDENT_AMBULATORY_CARE_PROVIDER_SITE_OTHER): Payer: No Typology Code available for payment source | Admitting: Family Medicine

## 2021-10-14 ENCOUNTER — Ambulatory Visit (INDEPENDENT_AMBULATORY_CARE_PROVIDER_SITE_OTHER): Payer: No Typology Code available for payment source | Admitting: Family Medicine

## 2021-10-14 ENCOUNTER — Other Ambulatory Visit (HOSPITAL_BASED_OUTPATIENT_CLINIC_OR_DEPARTMENT_OTHER): Payer: Self-pay

## 2021-10-14 ENCOUNTER — Encounter (INDEPENDENT_AMBULATORY_CARE_PROVIDER_SITE_OTHER): Payer: Self-pay | Admitting: Family Medicine

## 2021-10-14 VITALS — BP 115/80 | HR 75 | Temp 98.0°F | Ht 64.0 in | Wt 170.0 lb

## 2021-10-14 DIAGNOSIS — E559 Vitamin D deficiency, unspecified: Secondary | ICD-10-CM | POA: Diagnosis not present

## 2021-10-14 DIAGNOSIS — Z6829 Body mass index (BMI) 29.0-29.9, adult: Secondary | ICD-10-CM

## 2021-10-14 DIAGNOSIS — E669 Obesity, unspecified: Secondary | ICD-10-CM

## 2021-10-14 DIAGNOSIS — E66812 Obesity, class 2: Secondary | ICD-10-CM

## 2021-10-14 MED ORDER — VITAMIN D (ERGOCALCIFEROL) 1.25 MG (50000 UNIT) PO CAPS
ORAL_CAPSULE | ORAL | 0 refills | Status: DC
  Filled 2021-10-14: qty 8, 28d supply, fill #0

## 2021-10-18 NOTE — Progress Notes (Signed)
Chief Complaint:   OBESITY Alison Davies is here to discuss her progress with her obesity treatment plan along with follow-up of her obesity related diagnoses. Alison Davies is on keeping a food journal and adhering to recommended goals of 1400-1500 calories and 120 grams of protein daily and states she is following her eating plan approximately 80% of the time. Alison Davies states she is doing weight lifting and cardio for 60 minutes 3 times per week.  Today's visit was #: 66 Starting weight: 199 lbs Starting date: 07/26/2017 Today's weight: 170 lbs Today's date: 10/14/2021 Total lbs lost to date: 29 Total lbs lost since last in-office visit: 3  Interim History: Alison Davies continues to do well with weight loss.  She continues to journal.  She is working 5 PM to 5 AM, and she goes to the gym.  She is working on not skipping meals.  She is on Wegovy and no side effects were noted.  Subjective:   1. Vitamin D deficiency Alison Davies is stable on Vitamin D prescription, no side effects were noted.  Assessment/Plan:   1. Vitamin D deficiency We will refill prescription Vitamin D for 1 month. Alison Davies will follow-up for routine testing of Vitamin D, at least 2-3 times per year to avoid over-replacement.  - Vitamin D, Ergocalciferol, (DRISDOL) 1.25 MG (50000 UNIT) CAPS capsule; TAKE 1 CAPSULE (50,000 UNITS TOTAL) BY MOUTH 2 (TWO) TIMES A WEEK.  Dispense: 8 capsule; Refill: 0  2. Obesity, Current BMI 29.2 Alison Davies is currently in the action stage of change. As such, her goal is to continue with weight loss efforts. She has agreed to keeping a food journal and adhering to recommended goals of 1400-1500 calories and 120+ grams of protein daily.   Alison Davies was educated on celebration strategies including EtOH intake, snack planning to help decrease hunger, and party eating strategies.  We will recheck fasting labs at her next visit.  Exercise goals: As is.  Behavioral modification strategies: increasing lean protein intake,  dealing with family or coworker sabotage, and travel eating strategies.  Alison Davies has agreed to follow-up with our clinic in 3 to 4 weeks. She was informed of the importance of frequent follow-up visits to maximize her success with intensive lifestyle modifications for her multiple health conditions.   Objective:   Blood pressure 115/80, pulse 75, temperature 98 F (36.7 C), height 5\' 4"  (1.626 m), weight 170 lb (77.1 kg), SpO2 98 %. Body mass index is 29.18 kg/m.  General: Cooperative, alert, well developed, in no acute distress. HEENT: Conjunctivae and lids unremarkable. Cardiovascular: Regular rhythm.  Lungs: Normal work of breathing. Neurologic: No focal deficits.   Lab Results  Component Value Date   CREATININE 0.84 07/19/2021   BUN 16 07/19/2021   NA 139 07/19/2021   K 3.8 07/19/2021   CL 108 07/19/2021   CO2 21 07/19/2021   Lab Results  Component Value Date   ALT 11 07/19/2021   AST 12 07/19/2021   ALKPHOS 90 02/24/2021   BILITOT 0.6 07/19/2021   Lab Results  Component Value Date   HGBA1C 4.6 07/19/2021   HGBA1C 4.8 02/24/2021   HGBA1C 4.9 03/24/2020   HGBA1C 4.8 10/23/2019   HGBA1C 4.8 06/13/2019   Lab Results  Component Value Date   INSULIN 12.9 02/24/2021   INSULIN 12.6 10/27/2020   INSULIN 18.6 03/24/2020   INSULIN 11.1 10/23/2019   INSULIN 15.4 06/13/2019   Lab Results  Component Value Date   TSH 2.36 07/19/2021   Lab Results  Component Value Date   CHOL 200 (H) 07/19/2021   HDL 50 07/19/2021   LDLCALC 129 (H) 07/19/2021   TRIG 103 07/19/2021   CHOLHDL 4.0 07/19/2021   Lab Results  Component Value Date   VD25OH 18.2 (L) 02/24/2021   VD25OH 27.6 (L) 10/27/2020   VD25OH 21.1 (L) 03/24/2020   Lab Results  Component Value Date   WBC 8.8 07/19/2021   HGB 14.1 07/19/2021   HCT 42.4 07/19/2021   MCV 93.0 07/19/2021   PLT 322 07/19/2021   No results found for: "IRON", "TIBC", "FERRITIN"  Attestation Statements:   Reviewed by clinician on  day of visit: allergies, medications, problem list, medical history, surgical history, family history, social history, and previous encounter notes.  Time spent on visit including pre-visit chart review and post-visit care and charting was 40 minutes.   I, Burt Knack, am acting as transcriptionist for Quillian Quince, MD.  I have reviewed the above documentation for accuracy and completeness, and I agree with the above. -  Quillian Quince, MD

## 2021-11-13 ENCOUNTER — Ambulatory Visit
Admission: RE | Admit: 2021-11-13 | Discharge: 2021-11-13 | Disposition: A | Discharge status: Home / Self Care | Payer: No Typology Code available for payment source | Source: Ambulatory Visit | Attending: Emergency Medicine | Admitting: Emergency Medicine

## 2021-11-13 VITALS — BP 135/95 | HR 75 | Temp 97.7°F | Resp 18

## 2021-11-13 DIAGNOSIS — M62838 Other muscle spasm: Secondary | ICD-10-CM | POA: Diagnosis not present

## 2021-11-13 MED ORDER — BACLOFEN 10 MG PO TABS: 10.0000 mg | ORAL_TABLET | Freq: Every day | ORAL | 0 refills | Status: AC

## 2021-11-13 MED ORDER — IBUPROFEN 600 MG PO TABS: 600.0000 mg | ORAL_TABLET | Freq: Three times a day (TID) | ORAL | 0 refills | Status: DC | PRN

## 2021-11-13 MED ORDER — KETOROLAC TROMETHAMINE 30 MG/ML IJ SOLN
30.0000 mg | Freq: Once | INTRAMUSCULAR | Status: AC
  Administered 2021-11-13: 30 mg via INTRAMUSCULAR

## 2021-11-13 MED ORDER — DICLOFENAC SODIUM 1 % EX GEL: 4.0000 g | Freq: Four times a day (QID) | CUTANEOUS | 2 refills | Status: AC

## 2021-11-13 NOTE — ED Provider Notes (Addendum)
[facility]    CSN: [id number] Arrival date & time: 11/13/21  1036    HISTORY   Chief Complaint  Patient presents with   Shoulder Pain    Lt shoulder/trap muscle pain x 3 days. NKI. Full range of motion. Ice/heat and ibuprofen with no relief. Pain when turning head to left. - Entered by patient   HPI Alison Davies is a pleasant, 36 y.o. female who presents to urgent care today complaining of Pain that radiates from her neck down to her shoulder on the left side that began about 4 days ago and has become progressively worse.  Patient states she worked yesterday which made it even worse.  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  Patient states he is able to move her arm but not without pain.  Patient states she has not had this issue in the past.  Patient states it hurts to turn her head to the left but she does have full range of motion of her neck.  Patient denies prior trauma or to her neck or shoulder.  The history is provided by the patient.   Past Medical History:  Diagnosis Date   Anxiety    Hyperlipidemia    Leg edema    Migraines    Vaginal Pap smear, abnormal 06/12/2019   LGSIL +HPV   Patient Active Problem List   Diagnosis Date Noted   Well adult exam 07/19/2021   Obesity (BMI 30.0-34.9) 01/18/2021   Left knee pain 04/08/2019   Pain of right heel 01/16/2018   Low back pain 12/07/2017   HLD (hyperlipidemia) 10/05/2017   GAD (generalized anxiety disorder) 10/05/2017   Migraine headache 11/16/2012   Past Surgical History:  Procedure Laterality Date   COLPOSCOPY N/A    CRYOTHERAPY  12/2020   cervix   OB History     Gravida  0   Para  0   Term  0   Preterm  0   AB  0   Living  0      SAB  0   IAB  0   Ectopic  0   Multiple  0   Live Births  0          Home Medications    Prior to Admission medications   Medication Sig Start  Date End Date Taking? Authorizing Provider  Biotin 3 MG TABS Take 2 tablets by mouth daily.    [provider]  Cyanocobalamin (VITAMIN B-12 CR) 1500 MCG TBCR Take 2 tablets by mouth 2 (two) times daily.    [provider]  Melatonin 5 MG CHEW Chew 2 capsules by mouth at bedtime as needed.    [provider]  Multiple Vitamin (MULTIVITAMIN WITH MINERALS) TABS tablet Take 1 tablet by mouth daily.    [provider]  norethindrone (MICRONOR) 0.35 MG tablet TAKE 1 TABLET BY MOUTH ONCE DAILY 10/09/20 12/07/21  Willodean Rosenthal, MD  Semaglutide-Weight Management 1.7 MG/0.75ML SOAJ INJECT 1.7 MG INTO THE SKIN ONCE A WEEK. 09/15/21 09/15/22  Quillian Quince D, MD  sertraline (ZOLOFT) 100 MG tablet Take 1&1/2 tablets (150 mg total) by mouth daily. 07/19/21   Everrett Coombe, DO  SUMAtriptan (IMITREX) 25 MG tablet Take 1 tablet (25 mg total) by mouth every 2 (two) hours as needed for migraine. May repeat in 2 hours if headache persists or recurs. 05/11/21  Clayborne DanaBeck, Taylor B, NP  topiramate (TOPAMAX) 100 MG tablet Take 1 tablet (100 mg total) by mouth at bedtime. 07/19/21   Everrett CoombeMatthews, Cody, DO  Turmeric (QC TUMERIC COMPLEX PO) Take by mouth.    [provider]  vitamin C (ASCORBIC ACID) 250 MG tablet Take 250 mg by mouth daily.    [provider]  Vitamin D, Ergocalciferol, (DRISDOL) 1.25 MG (50000 UNIT) CAPS capsule TAKE 1 CAPSULE (50,000 UNITS TOTAL) BY MOUTH 2 (TWO) TIMES A WEEK. 10/14/21 10/14/22  Quillian QuinceBeasley, Caren D, MD    Family History Family History  Problem Relation Age of Onset   Anxiety disorder Mother    Diabetes Father    Obesity Father    Social History Social History   Tobacco Use   Smoking status: Never   Smokeless tobacco: Never  Vaping Use   Vaping Use: Never used  Substance Use Topics   Alcohol use: Yes    Comment: occasional   Drug use: Never   Allergies   Patient has no known allergies.  Review of Systems Review of  Systems Pertinent findings revealed after performing a 14 point review of systems has been noted in the history of present illness.  Physical Exam Triage Vital Signs ED Triage Vitals  Enc Vitals Group     BP 02/26/21 0827 (!) 147/82     Pulse Rate 02/26/21 0827 72     Resp 02/26/21 0827 18     Temp 02/26/21 0827 98.3 F (36.8 C)     Temp Source 02/26/21 0827 Oral     SpO2 02/26/21 0827 98 %     Weight --      Height --      Head Circumference --      Peak Flow --      Pain Score 02/26/21 0826 5     Pain Loc --      Pain Edu? --      Excl. in GC? --    Updated Vital Signs BP (!) 135/95 (BP Location: Left Arm)   Pulse 75   Temp 97.7 F (36.5 C) (Oral)   Resp 18   LMP 10/30/2021 (Approximate)   SpO2 98%   Physical Exam Vitals and nursing note reviewed.  Constitutional:      General: She is not in acute distress.    Appearance: Normal appearance.  HENT:     Head: Normocephalic and atraumatic.  Eyes:     Pupils: Pupils are equal, round, and reactive to light.  Cardiovascular:     Rate and Rhythm: Normal rate and regular rhythm.  Pulmonary:     Effort: Pulmonary effort is normal.     Breath sounds: Normal breath sounds.  Musculoskeletal:        General: Normal range of motion.     Right shoulder: Normal.     Left shoulder: Tenderness present. No swelling, deformity, effusion, laceration, bony tenderness or crepitus. Normal range of motion. Normal strength. Normal pulse.     Right upper arm: Normal.     Left upper arm: Normal.     Cervical back: Normal range of motion and neck supple. Spasms (Left upper trapezius) and tenderness (Focal, left upper trapezius) present. No swelling, edema, deformity, erythema, signs of trauma, lacerations, rigidity, torticollis, bony tenderness or crepitus. Pain with movement (Cervical paraspinous muscles and upper trapezius) present. Normal range of motion.  Skin:    General: Skin is warm and dry.  Neurological:     General: No focal  deficit present.     Mental Status: She is alert and oriented to person, place, and time. Mental status is at baseline.  Psychiatric:        Mood and Affect: Mood normal.        Behavior: Behavior normal.        Thought Content: Thought content normal.        Judgment: Judgment normal.     [facility] Couse / Diagnostics / Procedures:     Radiology No results found.  Procedures Nerve Block  Date/Time: 11/13/2021 11:20 AM  Performed by: Theadora Rama Scales, PA-C Authorized by: Theadora Rama Scales, PA-C   Consent:    Consent obtained:  Verbal   Consent given by:  Patient   Risks, benefits, and alternatives were discussed: yes     Risks discussed:  Allergic reaction, infection, nerve damage, swelling, unsuccessful block, pain, bleeding and intravenous injection   Alternatives discussed:  No treatment, delayed treatment, alternative treatment and referral Universal protocol:    Procedure explained and questions answered to patient or proxy's satisfaction: yes     Patient identity confirmed:  Verbally with patient and arm band Indications:    Indications:  Pain relief Location:    Body area:  Upper extremity (Upper trapezius trigger point injection on the left)   Laterality:  Left Pre-procedure details:    Skin preparation:  Povidone-iodine   Preparation: Patient was prepped and draped in usual sterile fashion   Skin anesthesia:    Skin anesthesia method:  None Procedure details:    Block needle gauge:  27 G   Anesthetic injected:  Lidocaine 1% w/o epi (Area of diffuse spasm in the left upper trapezius muscle was isolated between my left thumb and left index finger and lifted away prior to injection.  Needle inserted, 0.5 cc of lido instilled 8x with each redirect of needle for total of 4 cc)   Steroid injected:  None   Additive injected:  None   Injection procedure:  Anatomic landmarks identified, incremental injection, anatomic landmarks palpated and introduced needle    Paresthesia:  None Post-procedure details:    Dressing:  Sterile dressing   Outcome:  Anesthesia achieved   Procedure completion:  Tolerated  (including critical care time) EKG  Pending results:  Labs Reviewed - No data to display  Medications Ordered in UC: Medications  ketorolac (TORADOL) 30 MG/ML injection 30 mg (30 mg Intramuscular Given 11/13/21 1126)    UC Diagnoses / Final Clinical Impressions(s)   I have reviewed the triage vital signs and the nursing notes.  Pertinent labs & imaging results that were available during my care of the patient were reviewed by me and considered in my medical decision making (see chart for details).    Final diagnoses:  Cervical paraspinous muscle spasm    Patient was provided with a trigger point injection to the left upper trapezius as well as an injection during their visit today for acute pain relief.  Patient was advised to:  Take Ibuprofen 600 mg 3 times daily for the next 5 to 7 days Take baclofen once daily 1 hour prior to bedtime Apply ice pack to affected area 4 times daily for 20 minutes each time Apply topical Voltaren gel 4 times daily as needed Avoid stretching or strengthening exercises until pain is completely resolved Return to urgent care in the next 2 to 3 days for repeat ketorolac injection if needed Patient education handout provided for self-care at home. Consider physical therapy, chiropractic  care, orthopedic follow-up Return precautions advised  ED Prescriptions     Medication Sig Dispense Auth. Provider   baclofen (LIORESAL) 10 MG tablet Take 1 tablet (10 mg total) by mouth at bedtime for 7 days. 7 tablet Theadora Rama Scales, PA-C   ibuprofen (ADVIL) 600 MG tablet Take 1 tablet (600 mg total) by mouth every 8 (eight) hours as needed for up to 30 doses for fever, headache, mild pain or moderate pain (Inflammation). Take 1 tablet 3 times daily as needed for inflammation of upper airways and/or pain. 30 tablet  Theadora Rama Scales, PA-C   diclofenac Sodium (VOLTAREN) 1 % GEL Apply 4 g topically 4 (four) times daily. Apply to affected areas 4 times daily as needed for pain. 100 g Theadora Rama Scales, PA-C      PDMP not reviewed this encounter.  Discharge Instructions:   Discharge Instructions      I enclosed some information for you to read about trigger point injections which we performed during your visit today.  I hope this information is helpful.  You also received an injection of ketorolac, a strong anti-inflammatory pain reliever that should provide you with lasting pain relief for the next 6 to 8 hours, possibly longer.  This evening, you may consider taking baclofen 10 mg or breaking in half and taking 5 mg tablet to help relax the spasm in your left upper trapezius muscle.  Please continue to ice the area 3-4 times daily for 20 minutes each time to keep the area uninflamed and keep the lidocaine local to the area.  Applying heat promote circulation and you may find that the lidocaine disperses more quickly with heat.  It is okay to resume normal activities at this time however any attempts to stretch or strengthen the area are not recommended.  Think about stretching and strengthening being similar to ripping the scab off of the wound.  Every time the muscle becomes calm and the spasm releases, you trying to stretch it or strengthen it will make the spasm come back.  I have provided you with a note to be out of work this weekend that you are certainly welcome to use.  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  You may also ask your supervisor if you could have assistance with this so that you can still work but not to stress the muscle.  Thank you for visiting urgent care today.  I certainly hope you feel better soon.  I will be here tomorrow if you would like to come back for repeat  ketorolac injection.      Disposition Upon Discharge:  Condition: stable for discharge home Home: take medications as prescribed; routine discharge instructions as discussed; follow up as advised.  Patient presented with an acute illness with associated systemic symptoms and significant discomfort requiring urgent management. In my opinion, this is a condition that a prudent lay person (someone who possesses an average knowledge of health and medicine) may potentially expect to result in complications if not addressed urgently such as respiratory distress, impairment of bodily function or dysfunction of bodily organs.   Routine symptom specific, illness specific and/or disease specific instructions were discussed with the patient and/or caregiver at length.   As such, the patient has been evaluated and assessed, work-up was performed and treatment was provided in alignment with urgent care protocols and evidence based  medicine.  Patient/parent/caregiver has been advised that the patient may require follow up for further testing and treatment if the symptoms continue in spite of treatment, as clinically indicated and appropriate.  Patient/parent/caregiver has been advised to report to orthopedic urgent care clinic or return to the [facility] or PCP in 3-5 days if no better; follow-up with orthopedics, PCP or the Emergency Department if new signs and symptoms develop or if the current signs or symptoms continue to change or worsen for further workup, evaluation and treatment as clinically indicated and appropriate  The patient will follow up with their current PCP if and as advised. If the patient does not currently have a PCP we will have assisted them in obtaining one.   The patient may need specialty follow up if the symptoms continue, in spite of conservative treatment and management, for further workup, evaluation, consultation and treatment as clinically indicated and  appropriate.  Patient/parent/caregiver verbalized understanding and agreement of plan as discussed.  All questions were addressed during visit.  Please see discharge instructions below for further details of plan.  This office note has been dictated using Teaching laboratory technician.  Unfortunately, this method of dictation can sometimes lead to typographical or grammatical errors.  I apologize for your inconvenience in advance if this occurs.  Please do not hesitate to reach out to me if clarification is needed.      Theadora Rama Scales, PA-C 11/13/21 1134    Theadora Rama Scales, PA-C 11/13/21 1242    Theadora Rama Scales, PA-C 11/13/21 1245

## 2021-11-13 NOTE — ED Triage Notes (Signed)
Pt c/o neck pain that radiates down her shoulder that began 4 days ago.   Home interventions: motrin, ice/ heat packs

## 2021-11-13 NOTE — Discharge Instructions (Signed)
I enclosed some information for you to read about trigger point injections which we performed during your visit today.  I hope this information is helpful.  You also received an injection of ketorolac, a strong anti-inflammatory pain reliever that should provide you with lasting pain relief for the next 6 to 8 hours, possibly longer.  This evening, you may consider taking baclofen 10 mg or breaking in half and taking 5 mg tablet to help relax the spasm in your left upper trapezius muscle.  Please continue to ice the area 3-4 times daily for 20 minutes each time to keep the area uninflamed and keep the lidocaine local to the area.  Applying heat promote circulation and you may find that the lidocaine disperses more quickly with heat.  It is okay to resume normal activities at this time however any attempts to stretch or strengthen the area are not recommended.  Think about stretching and strengthening being similar to ripping the scab off of the wound.  Every time the muscle becomes calm and the spasm releases, you trying to stretch it or strengthen it will make the spasm come back.  I have provided you with a note to be out of work this weekend that you are certainly welcome to use.  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  You may also ask your supervisor if you could have assistance with this so that you can still work but not to stress the muscle.  Thank you for visiting urgent care today.  I certainly hope you feel better soon.  I will be here tomorrow if you would like to come back for repeat ketorolac injection.

## 2021-11-15 ENCOUNTER — Other Ambulatory Visit (HOSPITAL_BASED_OUTPATIENT_CLINIC_OR_DEPARTMENT_OTHER): Payer: Self-pay

## 2021-11-15 MED ORDER — METHYLPREDNISOLONE 4 MG PO TBPK
ORAL_TABLET | ORAL | 0 refills | Status: DC
  Filled 2021-11-15: qty 21, 6d supply, fill #0

## 2021-11-15 MED ORDER — METHOCARBAMOL 500 MG PO TABS
500.0000 mg | ORAL_TABLET | Freq: Four times a day (QID) | ORAL | 0 refills | Status: DC | PRN
  Filled 2021-11-15: qty 30, 8d supply, fill #0

## 2021-11-16 ENCOUNTER — Ambulatory Visit (INDEPENDENT_AMBULATORY_CARE_PROVIDER_SITE_OTHER): Payer: No Typology Code available for payment source | Admitting: Family Medicine

## 2021-11-16 ENCOUNTER — Encounter (INDEPENDENT_AMBULATORY_CARE_PROVIDER_SITE_OTHER): Payer: Self-pay | Admitting: Family Medicine

## 2021-11-16 VITALS — BP 110/72 | HR 71 | Temp 98.0°F | Ht 64.0 in | Wt 174.0 lb

## 2021-11-16 DIAGNOSIS — E785 Hyperlipidemia, unspecified: Secondary | ICD-10-CM

## 2021-11-16 DIAGNOSIS — Z6830 Body mass index (BMI) 30.0-30.9, adult: Secondary | ICD-10-CM

## 2021-11-16 DIAGNOSIS — E669 Obesity, unspecified: Secondary | ICD-10-CM

## 2021-11-16 DIAGNOSIS — E66812 Obesity, class 2: Secondary | ICD-10-CM

## 2021-11-16 DIAGNOSIS — E559 Vitamin D deficiency, unspecified: Secondary | ICD-10-CM

## 2021-11-17 LAB — CMP14+EGFR
ALT: 29 IU/L (ref 0–32)
AST: 24 IU/L (ref 0–40)
Albumin/Globulin Ratio: 1.7 (ref 1.2–2.2)
Albumin: 4.5 g/dL (ref 3.9–4.9)
Alkaline Phosphatase: 82 IU/L (ref 44–121)
BUN/Creatinine Ratio: 21 (ref 9–23)
BUN: 13 mg/dL (ref 6–20)
Bilirubin Total: 0.3 mg/dL (ref 0.0–1.2)
CO2: 19 mmol/L — ABNORMAL LOW (ref 20–29)
Calcium: 10.2 mg/dL (ref 8.7–10.2)
Chloride: 103 mmol/L (ref 96–106)
Creatinine, Ser: 0.63 mg/dL (ref 0.57–1.00)
Globulin, Total: 2.6 g/dL (ref 1.5–4.5)
Glucose: 91 mg/dL (ref 70–99)
Potassium: 4.4 mmol/L (ref 3.5–5.2)
Sodium: 139 mmol/L (ref 134–144)
Total Protein: 7.1 g/dL (ref 6.0–8.5)
eGFR: 119 mL/min/{1.73_m2} (ref >59)

## 2021-11-17 LAB — LIPID PANEL WITH LDL/HDL RATIO
Cholesterol, Total: 246 mg/dL — ABNORMAL HIGH (ref 100–199)
HDL: 74 mg/dL (ref >39)
LDL Chol Calc (NIH): 159 mg/dL — ABNORMAL HIGH (ref 0–99)
LDL/HDL Ratio: 2.1 ratio (ref 0.0–3.2)
Triglycerides: 75 mg/dL (ref 0–149)
VLDL Cholesterol Cal: 13 mg/dL (ref 5–40)

## 2021-11-17 LAB — VITAMIN D 25 HYDROXY (VIT D DEFICIENCY, FRACTURES): Vit D, 25-Hydroxy: 26.1 ng/mL — ABNORMAL LOW (ref 30.0–100.0)

## 2021-11-17 NOTE — Progress Notes (Signed)
Chief Complaint:   OBESITY Alison Davies is here to discuss her progress with her obesity treatment plan along with follow-up of her obesity related diagnoses. Alison Davies is on keeping a food journal and adhering to recommended goals of 1400-1500 calories and 120+ grams of protein daily and states she is following her eating plan approximately 50% of the time. Alison Davies states she is weight lifting and cardio for 45 minutes 4 times per week.  Today's visit was #: 11 Starting weight: 199 lbs Starting date: 07/26/2017 Today's weight: 174 lbs Today's date: 11/16/2021 Total lbs lost to date: 25 Total lbs lost since last in-office visit: 0  Interim History: Alison Davies has had a lot of challenges and stressors.  She notes some increase in comfort eating.  She was traveling and was mindful of her food choices.  Subjective:   1. Hyperlipidemia, unspecified hyperlipidemia type Alison Davies is working on decreasing cholesterol in her diet.  She is not on a statin, and she is due for labs.  2. Vitamin D deficiency Alison Davies's last Vitamin D level was not at goal.  She is on vitamin D prescription twice weekly, and she is due for labs.  Assessment/Plan:   1. Hyperlipidemia, unspecified hyperlipidemia type We will check labs today. Alison Davies will continue to work on diet, exercise and weight loss efforts. Orders and follow up as documented in patient record.   - Lipid Panel With LDL/HDL Ratio - CMP14+EGFR  2. Vitamin D deficiency We will check labs today. Alison Davies will follow-up for routine testing of Vitamin D, at least 2-3 times per year to avoid over-replacement.  - VITAMIN D 25 Hydroxy (Vit-D Deficiency, Fractures)  3. Obesity, Current BMI 30.0 Alison Davies is currently in the action stage of change. As such, her goal is to continue with weight loss efforts. She has agreed to keeping a food journal and adhering to recommended goals of 1400-1500 calories and 120+ grams of protein daily.   Exercise goals: As is.   Behavioral  modification strategies: better snacking choices.  Alison Davies has agreed to follow-up with our clinic in 4 weeks. She was informed of the importance of frequent follow-up visits to maximize her success with intensive lifestyle modifications for her multiple health conditions.   Alison Davies was informed we would discuss her lab results at her next visit unless there is a critical issue that needs to be addressed sooner. Alison Davies agreed to keep her next visit at the agreed upon time to discuss these results.  Objective:   Blood pressure 110/72, pulse 71, temperature 98 F (36.7 C), height 5' 4" (1.626 m), weight 174 lb (78.9 kg), last menstrual period 10/30/2021, SpO2 98 %. Body mass index is 29.87 kg/m.  General: Cooperative, alert, well developed, in no acute distress. HEENT: Conjunctivae and lids unremarkable. Cardiovascular: Regular rhythm.  Lungs: Normal work of breathing. Neurologic: No focal deficits.   Lab Results  Component Value Date   CREATININE 0.63 11/16/2021   BUN 13 11/16/2021   NA 139 11/16/2021   K 4.4 11/16/2021   CL 103 11/16/2021   CO2 19 (L) 11/16/2021   Lab Results  Component Value Date   ALT 29 11/16/2021   AST 24 11/16/2021   ALKPHOS 82 11/16/2021   BILITOT 0.3 11/16/2021   Lab Results  Component Value Date   HGBA1C 4.6 07/19/2021   HGBA1C 4.8 02/24/2021   HGBA1C 4.9 03/24/2020   HGBA1C 4.8 10/23/2019   HGBA1C 4.8 06/13/2019   Lab Results  Component Value Date  INSULIN 12.9 02/24/2021   INSULIN 12.6 10/27/2020   INSULIN 18.6 03/24/2020   INSULIN 11.1 10/23/2019   INSULIN 15.4 06/13/2019   Lab Results  Component Value Date   TSH 2.36 07/19/2021   Lab Results  Component Value Date   CHOL 246 (H) 11/16/2021   HDL 74 11/16/2021   LDLCALC 159 (H) 11/16/2021   TRIG 75 11/16/2021   CHOLHDL 4.0 07/19/2021   Lab Results  Component Value Date   VD25OH 26.1 (L) 11/16/2021   VD25OH 18.2 (L) 02/24/2021   VD25OH 27.6 (L) 10/27/2020   Lab Results   Component Value Date   WBC 8.8 07/19/2021   HGB 14.1 07/19/2021   HCT 42.4 07/19/2021   MCV 93.0 07/19/2021   PLT 322 07/19/2021   No results found for: "IRON", "TIBC", "FERRITIN"  Attestation Statements:   Reviewed by clinician on day of visit: allergies, medications, problem list, medical history, surgical history, family history, social history, and previous encounter notes.   I, Trixie Dredge, am acting as transcriptionist for Dennard Nip, MD.  I have reviewed the above documentation for accuracy and completeness, and I agree with the above. -  Dennard Nip, MD

## 2021-11-25 ENCOUNTER — Other Ambulatory Visit (HOSPITAL_BASED_OUTPATIENT_CLINIC_OR_DEPARTMENT_OTHER): Payer: Self-pay

## 2021-11-25 MED ORDER — CYCLOBENZAPRINE HCL 5 MG PO TABS
ORAL_TABLET | ORAL | 0 refills | Status: DC
  Filled 2021-11-25: qty 15, 15d supply, fill #0

## 2021-12-01 ENCOUNTER — Ambulatory Visit (INDEPENDENT_AMBULATORY_CARE_PROVIDER_SITE_OTHER): Payer: No Typology Code available for payment source | Admitting: Sports Medicine

## 2021-12-01 ENCOUNTER — Encounter: Payer: Self-pay | Admitting: Sports Medicine

## 2021-12-01 ENCOUNTER — Ambulatory Visit (INDEPENDENT_AMBULATORY_CARE_PROVIDER_SITE_OTHER): Payer: No Typology Code available for payment source

## 2021-12-01 DIAGNOSIS — S46012A Strain of muscle(s) and tendon(s) of the rotator cuff of left shoulder, initial encounter: Secondary | ICD-10-CM | POA: Diagnosis not present

## 2021-12-01 DIAGNOSIS — M25512 Pain in left shoulder: Secondary | ICD-10-CM | POA: Insufficient documentation

## 2021-12-01 DIAGNOSIS — M75102 Unspecified rotator cuff tear or rupture of left shoulder, not specified as traumatic: Secondary | ICD-10-CM | POA: Insufficient documentation

## 2021-12-01 NOTE — Progress Notes (Signed)
    Procedures performed today:    None.  Independent interpretation of notes and tests performed by another provider:   None.  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 First day out of work was November 15, 2021. Today she has pain over the deltoid, she has significant difficulty with abduction, on exam she has a very strongly positive empty can sign, with severe weakness, she has positive Neer's, Hawkins signs. I am concerned that she has a full-thickness rotator cuff tear. Continue at work, she will email [name] her FMLA paperwork. I will go ahead and order her x-rays and an MRI, return to see me to go over MRI results. She is using Tylenol and ibuprofen to control her pain which seems to work.    ____________________________________________ Ihor Austin. Benjamin Stain, M.D., ABFM., CAQSM., AME. Primary Care and Sports Medicine [place] [facility]  Adjunct Professor of Family Medicine  [place] of [facility] [employer]  Restaurant manager, fast food

## 2021-12-01 NOTE — Assessment & Plan Note (Signed)
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 First day out of work was November 15, 2021. Today she has pain over the deltoid, she has significant difficulty with abduction, on exam she has a very strongly positive empty can sign, with severe weakness, she has positive Neer's, Hawkins signs. I am concerned that she has a full-thickness rotator cuff tear. Continue at work, she will email [name] her FMLA paperwork. I will go ahead and order her x-rays and an MRI, return to see me to go over MRI results. She is using Tylenol and ibuprofen to control her pain which seems to work.

## 2021-12-04 ENCOUNTER — Ambulatory Visit (INDEPENDENT_AMBULATORY_CARE_PROVIDER_SITE_OTHER): Payer: No Typology Code available for payment source

## 2021-12-04 DIAGNOSIS — S46012A Strain of muscle(s) and tendon(s) of the rotator cuff of left shoulder, initial encounter: Secondary | ICD-10-CM

## 2021-12-06 ENCOUNTER — Encounter: Payer: Self-pay | Admitting: Sports Medicine

## 2021-12-06 NOTE — Telephone Encounter (Signed)
Patient has an appt scheduled for 12/08/21 with Dr Karie Schwalbe

## 2021-12-08 ENCOUNTER — Ambulatory Visit (INDEPENDENT_AMBULATORY_CARE_PROVIDER_SITE_OTHER): Payer: No Typology Code available for payment source

## 2021-12-08 ENCOUNTER — Other Ambulatory Visit: Payer: Self-pay | Admitting: Obstetrics & Gynecology

## 2021-12-08 ENCOUNTER — Encounter (INDEPENDENT_AMBULATORY_CARE_PROVIDER_SITE_OTHER): Payer: Self-pay

## 2021-12-08 ENCOUNTER — Ambulatory Visit (INDEPENDENT_AMBULATORY_CARE_PROVIDER_SITE_OTHER): Payer: No Typology Code available for payment source | Admitting: Sports Medicine

## 2021-12-08 ENCOUNTER — Other Ambulatory Visit: Payer: Self-pay

## 2021-12-08 ENCOUNTER — Other Ambulatory Visit (HOSPITAL_BASED_OUTPATIENT_CLINIC_OR_DEPARTMENT_OTHER): Payer: Self-pay

## 2021-12-08 DIAGNOSIS — M25512 Pain in left shoulder: Secondary | ICD-10-CM | POA: Diagnosis not present

## 2021-12-08 DIAGNOSIS — Z3009 Encounter for other general counseling and advice on contraception: Secondary | ICD-10-CM

## 2021-12-08 DIAGNOSIS — S46012A Strain of muscle(s) and tendon(s) of the rotator cuff of left shoulder, initial encounter: Secondary | ICD-10-CM

## 2021-12-08 NOTE — Assessment & Plan Note (Signed)
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 First day out of work was November 15, 2021. At the last visit she had signs consisting with significant weakness, impingement signs, we suspected a cuff tear, MRI did not show any rotator cuff tearing but did show some edema in the acromioclavicular joint, no subacromial/subdeltoid bursitis. This is good news, we did inject medication into her acromioclavicular joint today, she can follow-up with me in 4 weeks but let me know in 2 weeks if she is ready to return to work.

## 2021-12-08 NOTE — Progress Notes (Signed)
    Procedures performed today:    Procedure: Real-time Ultrasound Guided injection of the left acromioclavicular joint Device: Samsung [id number]  Verbal informed consent obtained.  Time-out conducted.  Noted no overlying erythema, induration, or other signs of local infection.  Skin prepped in a sterile fashion.  Local anesthesia: Topical Ethyl chloride.  With sterile technique and under real time ultrasound guidance: Mild acromioclavicular joint effusion noted, 1 cc lidocaine, 1 cc kenalog 40 injected easily. Completed without difficulty  Advised to call if fevers/chills, erythema, induration, drainage, or persistent bleeding.  Images permanently stored and available for review in PACS.  Impression: Technically successful ultrasound guided injection.  Independent interpretation of notes and tests performed by another provider:   None.  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 First day out of work was November 15, 2021. At the last visit she had signs consisting with significant weakness, impingement signs, we suspected a cuff tear, MRI did not show any rotator cuff tearing but did show some edema in the acromioclavicular joint, no subacromial/subdeltoid bursitis. This is good news, we did inject medication into her acromioclavicular joint today, she can follow-up with me in 4 weeks but let me know in 2 weeks if she is ready to return to work.    ____________________________________________ Ihor Austin. Benjamin Stain, M.D., ABFM., CAQSM., AME. Primary Care and Sports Medicine [place] [facility]  Adjunct Professor of Family Medicine  [place] [employer] [facility] [employer]  Restaurant manager, fast food

## 2021-12-10 ENCOUNTER — Other Ambulatory Visit (HOSPITAL_BASED_OUTPATIENT_CLINIC_OR_DEPARTMENT_OTHER): Payer: Self-pay

## 2021-12-13 ENCOUNTER — Ambulatory Visit (INDEPENDENT_AMBULATORY_CARE_PROVIDER_SITE_OTHER): Payer: No Typology Code available for payment source | Admitting: Family Medicine

## 2021-12-13 ENCOUNTER — Ambulatory Visit (INDEPENDENT_AMBULATORY_CARE_PROVIDER_SITE_OTHER): Payer: No Typology Code available for payment source | Admitting: Sports Medicine

## 2021-12-13 ENCOUNTER — Other Ambulatory Visit (HOSPITAL_BASED_OUTPATIENT_CLINIC_OR_DEPARTMENT_OTHER): Payer: Self-pay

## 2021-12-13 ENCOUNTER — Encounter (INDEPENDENT_AMBULATORY_CARE_PROVIDER_SITE_OTHER): Payer: Self-pay | Admitting: Family Medicine

## 2021-12-13 VITALS — BP 120/75 | HR 78 | Temp 97.8°F | Ht 64.0 in | Wt 174.0 lb

## 2021-12-13 DIAGNOSIS — Z6829 Body mass index (BMI) 29.0-29.9, adult: Secondary | ICD-10-CM | POA: Diagnosis not present

## 2021-12-13 DIAGNOSIS — F439 Reaction to severe stress, unspecified: Secondary | ICD-10-CM | POA: Diagnosis not present

## 2021-12-13 DIAGNOSIS — E669 Obesity, unspecified: Secondary | ICD-10-CM | POA: Diagnosis not present

## 2021-12-13 DIAGNOSIS — E559 Vitamin D deficiency, unspecified: Secondary | ICD-10-CM | POA: Diagnosis not present

## 2021-12-13 DIAGNOSIS — M25512 Pain in left shoulder: Secondary | ICD-10-CM

## 2021-12-13 MED ORDER — VITAMIN D (ERGOCALCIFEROL) 1.25 MG (50000 UNIT) PO CAPS
ORAL_CAPSULE | ORAL | 0 refills | Status: DC
  Filled 2021-12-13: qty 8, 28d supply, fill #0

## 2021-12-13 NOTE — Progress Notes (Signed)
    Procedures performed today:    None.  Independent interpretation of notes and tests performed by another provider:   None.  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 We will continue to hold her out of work, I like her to do at least another month of home therapy. I filled out her disability form today. She can return to see me in a month, we have set the return to work date for 17 September, I'd like to see her back just before that.    ____________________________________________ Ihor Austin. Benjamin Stain, M.D., ABFM., CAQSM., AME. Primary Care and Sports Medicine [place] [facility]  Adjunct Professor of Family Medicine  [place] of [facility] [employer]  Restaurant manager, fast food

## 2021-12-13 NOTE — Assessment & Plan Note (Signed)
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 We will continue to hold her out of work, I like her to do at least another month of home therapy. I filled out her disability form today. She can return to see me in a month, we have set the return to work date for 17 September, I'd like to see her back just before that.

## 2021-12-17 ENCOUNTER — Other Ambulatory Visit: Payer: Self-pay | Admitting: Obstetrics & Gynecology

## 2021-12-17 ENCOUNTER — Other Ambulatory Visit (HOSPITAL_BASED_OUTPATIENT_CLINIC_OR_DEPARTMENT_OTHER): Payer: Self-pay

## 2021-12-17 DIAGNOSIS — Z3009 Encounter for other general counseling and advice on contraception: Secondary | ICD-10-CM

## 2021-12-20 ENCOUNTER — Other Ambulatory Visit: Payer: Self-pay | Admitting: Obstetrics & Gynecology

## 2021-12-20 ENCOUNTER — Other Ambulatory Visit (HOSPITAL_BASED_OUTPATIENT_CLINIC_OR_DEPARTMENT_OTHER): Payer: Self-pay

## 2021-12-20 DIAGNOSIS — Z3009 Encounter for other general counseling and advice on contraception: Secondary | ICD-10-CM

## 2021-12-21 ENCOUNTER — Other Ambulatory Visit (HOSPITAL_BASED_OUTPATIENT_CLINIC_OR_DEPARTMENT_OTHER): Payer: Self-pay

## 2021-12-21 NOTE — Progress Notes (Signed)
Chief Complaint:   OBESITY Alison Davies is here to discuss her progress with her obesity treatment plan along with follow-up of her obesity related diagnoses. Alison Davies is on keeping a food journal and adhering to recommended goals of 1400-1500 calories and 120 + grams of protein daily and states she is following her eating plan approximately 50% of the time. Alison Davies states she is walking for 30-45 minutes 2-3 times per week.  Today's visit was #: 68 Starting weight: 199 lbs Starting date: 07/26/2017 Today's weight: 174 lbs Today's date: 12/13/2021 Total lbs lost to date: 25 Total lbs lost since last in-office visit: 0  Interim History: Alison Davies has done well with maintaining her weight loss over the last month. She is journaling on and off. She has been off work due to a shoulder injury and her stress level has increased. She has missed some doses of Wegovy during this time.   Subjective:   1. Stress Alison Davies has been in pain with increased stress, and she has been trying to limit emotional eating behavior. She is mindful of her actions and continues to exercise as tolerated.   2. Vitamin D deficiency Alison Davies is on Vitamin D weekly prescription, with no side effects noted.   Assessment/Plan:   1. Stress Alison Davies was offered support and encouragement. She will follow-up in 3 weeks to monitor her progress.   2. Vitamin D deficiency We will refill prescription Vitamin D for 1 month. Alison Davies will follow-up for routine testing of Vitamin D, at least 2-3 times per year to avoid over-replacement.  - Vitamin D, Ergocalciferol, (DRISDOL) 1.25 MG (50000 UNIT) CAPS capsule; TAKE 1 CAPSULE (50,000 UNITS TOTAL) BY MOUTH 2 (TWO) TIMES A WEEK.  Dispense: 8 capsule; Refill: 0  3. Obesity, Current BMI 29.9 Lyfe is currently in the action stage of change. As such, her goal is to continue with weight loss efforts. She has agreed to keeping a food journal and adhering to recommended goals of 1400-1500 calories and 120+  grams of protein daily.   We discussed various medication options to help Alison Davies with her weight loss efforts and we both agreed to restart Wegovy (no refill needed), and we will follow-up in 3 weeks.  Exercise goals: As is.   Behavioral modification strategies: increasing lean protein intake.  Alison Davies has agreed to follow-up with our clinic in 3 weeks. She was informed of the importance of frequent follow-up visits to maximize her success with intensive lifestyle modifications for her multiple health conditions.   Objective:   Blood pressure 120/75, pulse 78, temperature 97.8 F (36.6 C), height 5\' 4"  (1.626 m), weight 174 lb (78.9 kg), last menstrual period 10/30/2021, SpO2 98 %. Body mass index is 29.87 kg/m.  General: Cooperative, alert, well developed, in no acute distress. HEENT: Conjunctivae and lids unremarkable. Cardiovascular: Regular rhythm.  Lungs: Normal work of breathing. Neurologic: No focal deficits.   Lab Results  Component Value Date   CREATININE 0.63 11/16/2021   BUN 13 11/16/2021   NA 139 11/16/2021   K 4.4 11/16/2021   CL 103 11/16/2021   CO2 19 (L) 11/16/2021   Lab Results  Component Value Date   ALT 29 11/16/2021   AST 24 11/16/2021   ALKPHOS 82 11/16/2021   BILITOT 0.3 11/16/2021   Lab Results  Component Value Date   HGBA1C 4.6 07/19/2021   HGBA1C 4.8 02/24/2021   HGBA1C 4.9 03/24/2020   HGBA1C 4.8 10/23/2019   HGBA1C 4.8 06/13/2019   Lab Results  Component Value Date   INSULIN 12.9 02/24/2021   INSULIN 12.6 10/27/2020   INSULIN 18.6 03/24/2020   INSULIN 11.1 10/23/2019   INSULIN 15.4 06/13/2019   Lab Results  Component Value Date   TSH 2.36 07/19/2021   Lab Results  Component Value Date   CHOL 246 (H) 11/16/2021   HDL 74 11/16/2021   LDLCALC 159 (H) 11/16/2021   TRIG 75 11/16/2021   CHOLHDL 4.0 07/19/2021   Lab Results  Component Value Date   VD25OH 26.1 (L) 11/16/2021   VD25OH 18.2 (L) 02/24/2021   VD25OH 27.6 (L)  10/27/2020   Lab Results  Component Value Date   WBC 8.8 07/19/2021   HGB 14.1 07/19/2021   HCT 42.4 07/19/2021   MCV 93.0 07/19/2021   PLT 322 07/19/2021   No results found for: "IRON", "TIBC", "FERRITIN"  Attestation Statements:   Reviewed by clinician on day of visit: allergies, medications, problem list, medical history, surgical history, family history, social history, and previous encounter notes.   I, Burt Knack, am acting as transcriptionist for Quillian Quince, MD.  I have reviewed the above documentation for accuracy and completeness, and I agree with the above. -  Quillian Quince, MD

## 2021-12-27 ENCOUNTER — Other Ambulatory Visit (HOSPITAL_BASED_OUTPATIENT_CLINIC_OR_DEPARTMENT_OTHER): Payer: Self-pay

## 2021-12-27 ENCOUNTER — Ambulatory Visit (INDEPENDENT_AMBULATORY_CARE_PROVIDER_SITE_OTHER): Payer: No Typology Code available for payment source | Admitting: Family Medicine

## 2021-12-29 ENCOUNTER — Ambulatory Visit (INDEPENDENT_AMBULATORY_CARE_PROVIDER_SITE_OTHER): Payer: No Typology Code available for payment source | Admitting: Family Medicine

## 2021-12-29 ENCOUNTER — Other Ambulatory Visit (HOSPITAL_BASED_OUTPATIENT_CLINIC_OR_DEPARTMENT_OTHER): Payer: Self-pay

## 2021-12-29 ENCOUNTER — Encounter (INDEPENDENT_AMBULATORY_CARE_PROVIDER_SITE_OTHER): Payer: Self-pay | Admitting: Family Medicine

## 2021-12-29 VITALS — BP 112/69 | HR 82 | Temp 98.6°F | Ht 64.0 in | Wt 173.0 lb

## 2021-12-29 DIAGNOSIS — E559 Vitamin D deficiency, unspecified: Secondary | ICD-10-CM | POA: Diagnosis not present

## 2021-12-29 DIAGNOSIS — Z6829 Body mass index (BMI) 29.0-29.9, adult: Secondary | ICD-10-CM | POA: Diagnosis not present

## 2021-12-29 DIAGNOSIS — E669 Obesity, unspecified: Secondary | ICD-10-CM | POA: Diagnosis not present

## 2021-12-29 DIAGNOSIS — E8881 Metabolic syndrome: Secondary | ICD-10-CM | POA: Diagnosis not present

## 2021-12-29 MED ORDER — VITAMIN D (ERGOCALCIFEROL) 1.25 MG (50000 UNIT) PO CAPS
ORAL_CAPSULE | ORAL | 0 refills | Status: DC
  Filled 2021-12-29 – 2022-01-14 (×2): qty 8, 28d supply, fill #0

## 2021-12-29 MED ORDER — SEMAGLUTIDE-WEIGHT MANAGEMENT 1.7 MG/0.75ML ~~LOC~~ SOAJ
1.7000 mg | SUBCUTANEOUS | 0 refills | Status: DC
  Filled 2021-12-29: qty 3, 28d supply, fill #0
  Filled 2022-01-14: qty 9, 84d supply, fill #0

## 2021-12-30 ENCOUNTER — Other Ambulatory Visit (HOSPITAL_BASED_OUTPATIENT_CLINIC_OR_DEPARTMENT_OTHER): Payer: Self-pay

## 2021-12-30 ENCOUNTER — Other Ambulatory Visit: Payer: Self-pay | Admitting: Obstetrics & Gynecology

## 2021-12-30 DIAGNOSIS — Z3009 Encounter for other general counseling and advice on contraception: Secondary | ICD-10-CM

## 2021-12-31 ENCOUNTER — Other Ambulatory Visit (HOSPITAL_BASED_OUTPATIENT_CLINIC_OR_DEPARTMENT_OTHER): Payer: Self-pay

## 2021-12-31 ENCOUNTER — Other Ambulatory Visit: Payer: Self-pay | Admitting: Obstetrics and Gynecology

## 2021-12-31 DIAGNOSIS — Z3009 Encounter for other general counseling and advice on contraception: Secondary | ICD-10-CM

## 2022-01-04 ENCOUNTER — Ambulatory Visit (INDEPENDENT_AMBULATORY_CARE_PROVIDER_SITE_OTHER): Payer: No Typology Code available for payment source | Admitting: Sports Medicine

## 2022-01-04 DIAGNOSIS — M25512 Pain in left shoulder: Secondary | ICD-10-CM | POA: Diagnosis not present

## 2022-01-04 NOTE — Progress Notes (Signed)
    Procedures performed today:    None.  Independent interpretation of notes and tests performed by another provider:   None.  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 She is set to return to work on January 16, 2022, I think this is an appropriate return to work date. I filled out [place] disability paperwork today and we will fax off this office note from today. We can see her back as needed.    ____________________________________________ Ihor Austin. Benjamin Stain, M.D., ABFM., CAQSM., AME. Primary Care and Sports Medicine [place] [facility]  Adjunct Professor of Family Medicine  [place] [employer] [facility] of [employer]  Restaurant manager, fast food

## 2022-01-04 NOTE — Assessment & Plan Note (Signed)
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 She is set to return to work on January 16, 2022, I think this is an appropriate return to work date. I filled out [place] disability paperwork today and we will fax off this office note from today. We can see her back as needed.

## 2022-01-05 NOTE — Progress Notes (Signed)
Chief Complaint:   OBESITY Alison Davies is here to discuss her progress with her obesity treatment plan along with follow-up of her obesity related diagnoses. Alison Davies is on keeping a food journal and adhering to recommended goals of 1400-1500 calories and 120+ grams of protein daily and states she is following her eating plan approximately 60% of the time. Alison Davies states she is at the gym for 45 minutes 3 times per week.  Today's visit was #: 69 Starting weight: 199 lbs Starting date: 07/26/2017 Today's weight: 173 lbs Today's date: 12/29/2021 Total lbs lost to date: 26 Total lbs lost since last in-office visit: 1  Interim History: Alison Davies will continues to do well with weight loss. She has had some significant family stress and she struggled to stay on track. She did some emotional eating but she was mindful.   Subjective:   1. Vitamin D deficiency Alison Davies's last Vitamin D level was not yet at goal.   2. Insulin resistance Alison Davies continues to work on her diet and exercise. She is also on a GLP-1 to help.  Assessment/Plan:   1. Vitamin D deficiency Alison Davies will continue prescription Vitamin D 50,000 IU twice weekly, and we will refill for 1 month. She will follow-up for routine testing of Vitamin D, at least 2-3 times per year to avoid over-replacement.  - Vitamin D, Ergocalciferol, (DRISDOL) 1.25 MG (50000 UNIT) CAPS capsule; TAKE 1 CAPSULE (50,000 UNITS TOTAL) BY MOUTH 2 (TWO) TIMES A WEEK.  Dispense: 8 capsule; Refill: 0  2. Insulin resistance Alison Davies will continue her diet, exercise, and decreasing simple carbohydrates to help decrease the risk of diabetes. Alison Davies agreed to follow-up with [name] as directed to closely monitor her progress.  3. Obesity, Current BMI 29.8 Alison Davies is currently in the action stage of change. As such, her goal is to continue with weight loss efforts. She has agreed to keeping a food journal and adhering to recommended goals of 1400-1500 calories and 120+ grams of protein  daily.   We discussed various medication options to help Alison Davies with her weight loss efforts and we both agreed to continue Wegovy 1.7 mg once weekly, and we will refill for 1 month.  - Semaglutide-Weight Management 1.7 MG/0.75ML SOAJ; INJECT 1.7 MG INTO THE SKIN ONCE A WEEK.  Dispense: 9 mL; Refill: 0  Exercise goals: As is.   Behavioral modification strategies: increasing lean protein intake.  Alison Davies has agreed to follow-up with our clinic in 4 weeks. She was informed of the importance of frequent follow-up visits to maximize her success with intensive lifestyle modifications for her multiple health conditions.   Objective:   Blood pressure 112/69, pulse 82, temperature 98.6 F (37 C), height 5\' 4"  (1.626 m), weight 173 lb (78.5 kg), SpO2 98 %. Body mass index is 29.7 kg/m.  General: Cooperative, alert, well developed, in no acute distress. HEENT: Conjunctivae and lids unremarkable. Cardiovascular: Regular rhythm.  Lungs: Normal work of breathing. Neurologic: No focal deficits.   Lab Results  Component Value Date   CREATININE 0.63 11/16/2021   BUN 13 11/16/2021   NA 139 11/16/2021   K 4.4 11/16/2021   CL 103 11/16/2021   CO2 19 (L) 11/16/2021   Lab Results  Component Value Date   ALT 29 11/16/2021   AST 24 11/16/2021   ALKPHOS 82 11/16/2021   BILITOT 0.3 11/16/2021   Lab Results  Component Value Date   HGBA1C 4.6 07/19/2021   HGBA1C 4.8 02/24/2021   HGBA1C 4.9 03/24/2020  HGBA1C 4.8 10/23/2019   HGBA1C 4.8 06/13/2019   Lab Results  Component Value Date   INSULIN 12.9 02/24/2021   INSULIN 12.6 10/27/2020   INSULIN 18.6 03/24/2020   INSULIN 11.1 10/23/2019   INSULIN 15.4 06/13/2019   Lab Results  Component Value Date   TSH 2.36 07/19/2021   Lab Results  Component Value Date   CHOL 246 (H) 11/16/2021   HDL 74 11/16/2021   LDLCALC 159 (H) 11/16/2021   TRIG 75 11/16/2021   CHOLHDL 4.0 07/19/2021   Lab Results  Component Value Date   VD25OH 26.1 (L)  11/16/2021   VD25OH 18.2 (L) 02/24/2021   VD25OH 27.6 (L) 10/27/2020   Lab Results  Component Value Date   WBC 8.8 07/19/2021   HGB 14.1 07/19/2021   HCT 42.4 07/19/2021   MCV 93.0 07/19/2021   PLT 322 07/19/2021   No results found for: "IRON", "TIBC", "FERRITIN"  Attestation Statements:   Reviewed by clinician on day of visit: allergies, medications, problem list, medical history, surgical history, family history, social history, and previous encounter notes.   I, Burt Knack, am acting as transcriptionist for Quillian Quince, MD.  I have reviewed the above documentation for accuracy and completeness, and I agree with the above. -  Quillian Quince, MD

## 2022-01-13 ENCOUNTER — Other Ambulatory Visit (HOSPITAL_BASED_OUTPATIENT_CLINIC_OR_DEPARTMENT_OTHER): Payer: Self-pay

## 2022-01-14 ENCOUNTER — Other Ambulatory Visit (HOSPITAL_BASED_OUTPATIENT_CLINIC_OR_DEPARTMENT_OTHER): Payer: Self-pay

## 2022-01-19 ENCOUNTER — Ambulatory Visit: Payer: No Typology Code available for payment source | Admitting: Family Medicine

## 2022-01-19 ENCOUNTER — Telehealth: Payer: No Typology Code available for payment source | Admitting: Physician Assistant

## 2022-01-19 ENCOUNTER — Other Ambulatory Visit (HOSPITAL_BASED_OUTPATIENT_CLINIC_OR_DEPARTMENT_OTHER): Payer: Self-pay

## 2022-01-19 DIAGNOSIS — A084 Viral intestinal infection, unspecified: Secondary | ICD-10-CM

## 2022-01-19 MED ORDER — ONDANSETRON 4 MG PO TBDP
4.0000 mg | ORAL_TABLET | Freq: Three times a day (TID) | ORAL | 0 refills | Status: AC | PRN
  Filled 2022-01-19: qty 20, 7d supply, fill #0

## 2022-01-19 NOTE — Progress Notes (Signed)

## 2022-01-20 ENCOUNTER — Other Ambulatory Visit (HOSPITAL_BASED_OUTPATIENT_CLINIC_OR_DEPARTMENT_OTHER): Payer: Self-pay

## 2022-01-24 ENCOUNTER — Other Ambulatory Visit (HOSPITAL_BASED_OUTPATIENT_CLINIC_OR_DEPARTMENT_OTHER): Payer: Self-pay

## 2022-01-24 ENCOUNTER — Encounter: Payer: Self-pay | Admitting: Family Medicine

## 2022-01-24 ENCOUNTER — Ambulatory Visit (INDEPENDENT_AMBULATORY_CARE_PROVIDER_SITE_OTHER): Payer: No Typology Code available for payment source | Admitting: Family Medicine

## 2022-01-24 DIAGNOSIS — G43709 Chronic migraine without aura, not intractable, without status migrainosus: Secondary | ICD-10-CM | POA: Diagnosis not present

## 2022-01-24 DIAGNOSIS — F411 Generalized anxiety disorder: Secondary | ICD-10-CM | POA: Diagnosis not present

## 2022-01-24 DIAGNOSIS — E669 Obesity, unspecified: Secondary | ICD-10-CM | POA: Diagnosis not present

## 2022-01-24 MED ORDER — ALPRAZOLAM 0.25 MG PO TABS
ORAL_TABLET | ORAL | 0 refills | Status: DC
  Filled 2022-01-24: qty 30, 30d supply, fill #0

## 2022-01-24 MED ORDER — TOPIRAMATE 100 MG PO TABS
100.0000 mg | ORAL_TABLET | Freq: Every day | ORAL | 1 refills | Status: DC
  Filled 2022-01-24 – 2022-03-11 (×2): qty 90, 90d supply, fill #0

## 2022-01-24 NOTE — Assessment & Plan Note (Addendum)
She continues to do well with sertraline at current strength.  Rare use of alprazolam (30 tabe /1 year).  This was renewed. We'll plan to continue this at current strength.

## 2022-01-24 NOTE — Assessment & Plan Note (Signed)
She continues to do well with topiramate daily.  Has imitrex but hasn't needed recently.

## 2022-01-24 NOTE — Progress Notes (Signed)
Alison Davies - 36 y.o. female MRN [id number]  Date of birth: Jul 16, 1985  Subjective Chief Complaint  Patient presents with   Follow-up    migraines    HPI Alison Davies is a 36 y.o. female here today for follow up visit.   She reports that she is doing pretty good today.   She continues to see healthy weight and wellness.  Has done fairly well with this program.  Continues on semaglutide at 1.7mg .  Tolerating this pretty well.  She continues to exercise regularly.   Migraines are managed with combination of topiramate daily for prophylaxis and imitrex as needed for acute breakthrough.  Stable at this time with current regimen.   She continues on zoloft for management of anxiety.  This is working quite well  for her.  She denies side effects with current strength of medication.   ROS:  A comprehensive ROS was completed and negative except as noted per HPI  No Known Allergies  Past Medical History:  Diagnosis Date   Anxiety    Hyperlipidemia    Leg edema    Migraines    Vaginal Pap smear, abnormal 06/12/2019   LGSIL +HPV    Past Surgical History:  Procedure Laterality Date   COLPOSCOPY N/A    CRYOTHERAPY  12/2020   cervix    Social History   Socioeconomic History   Marital status: Single    Spouse name: Not on file   Number of children: Not on file   Years of education: Not on file   Highest education level: Not on file  Occupational History   Occupation: [employer] manager: [employer]  Tobacco Use   Smoking status: Never   Smokeless tobacco: Never  Vaping Use   Vaping Use: Never used  Substance and Sexual Activity   Alcohol use: Yes    Comment: occasional   Drug use: Never   Sexual activity: Yes    Birth control/protection: Pill  Other Topics Concern   Not on file  Social History Narrative   Not on file   Social Determinants of Health   Financial Resource Strain: Not on file  Food Insecurity: Not on file  Transportation Needs: Not on file   Physical Activity: Not on file  Stress: Not on file  Social Connections: Not on file    Family History  Problem Relation Age of Onset   Anxiety disorder Mother    Diabetes Father    Obesity Father     Health Maintenance  Topic Date Due   HPV VACCINES (2 - 3-dose SCDM series) 02/25/2021   INFLUENZA VACCINE  07/31/2022 (Originally 11/30/2021)   PAP SMEAR-Modifier  10/10/2023   TETANUS/TDAP  03/04/2028   COVID-19 Vaccine  Completed   Hepatitis C Screening  Completed   HIV Screening  Completed     ----------------------------------------------------------------------------------------------------------------------------------------------------------------------------------------------------------------- Physical Exam BP 123/87   Pulse 76   Ht 5\' 4"  (1.626 m)   Wt 177 lb (80.3 kg)   SpO2 97%   BMI 30.38 kg/m   Physical Exam Constitutional:      Appearance: Normal appearance.  Eyes:     General: No scleral icterus. Cardiovascular:     Rate and Rhythm: Normal rate and regular rhythm.  Pulmonary:     Effort: Pulmonary effort is normal.     Breath sounds: Normal breath sounds.  Musculoskeletal:     Cervical back: Neck supple.  Neurological:     Mental Status: She is alert.  Psychiatric:        Mood and Affect: Mood normal.        Behavior: Behavior normal.     ------------------------------------------------------------------------------------------------------------------------------------------------------------------------------------------------------------------- Assessment and Plan  Migraine headache She continues to do well with topiramate daily.  Has imitrex but hasn't needed recently.    GAD (generalized anxiety disorder) She continues to do well with sertraline at current strength.  Rare use of alprazolam (30 tabe /1 year).  This was renewed. We'll plan to continue this at current strength.    Obesity (BMI 30.0-34.9) Continuing to see [facility].  Stable at this time, will continue with regular visits.  Remains on semaglutide.    Meds ordered this encounter  Medications   topiramate (TOPAMAX) 100 MG tablet    Sig: Take 1 tablet (100 mg total) by mouth at bedtime.    Dispense:  90 tablet    Refill:  1   ALPRAZolam (XANAX) 0.25 MG tablet    Sig: TAKE 1 TABLET (0.25 MG TOTAL) BY MOUTH AT BEDTIME AS NEEDED FOR ANXIETY.    Dispense:  30 tablet    Refill:  0    No follow-ups on file.    This visit occurred during the SARS-CoV-2 public health emergency.  Safety protocols were in place, including screening questions prior to the visit, additional usage of staff PPE, and extensive cleaning of exam room while observing appropriate contact time as indicated for disinfecting solutions.

## 2022-01-24 NOTE — Assessment & Plan Note (Signed)
Continuing to see [facility].  Stable at this time, will continue with regular visits.  Remains on semaglutide.

## 2022-01-27 ENCOUNTER — Encounter (INDEPENDENT_AMBULATORY_CARE_PROVIDER_SITE_OTHER): Payer: Self-pay | Admitting: Family Medicine

## 2022-01-27 ENCOUNTER — Ambulatory Visit (INDEPENDENT_AMBULATORY_CARE_PROVIDER_SITE_OTHER): Payer: No Typology Code available for payment source | Admitting: Family Medicine

## 2022-01-27 VITALS — BP 113/80 | HR 92 | Temp 98.6°F | Ht 64.0 in | Wt 173.0 lb

## 2022-01-27 DIAGNOSIS — E669 Obesity, unspecified: Secondary | ICD-10-CM

## 2022-01-27 DIAGNOSIS — E559 Vitamin D deficiency, unspecified: Secondary | ICD-10-CM | POA: Diagnosis not present

## 2022-01-27 DIAGNOSIS — Z6829 Body mass index (BMI) 29.0-29.9, adult: Secondary | ICD-10-CM | POA: Diagnosis not present

## 2022-01-29 ENCOUNTER — Other Ambulatory Visit (HOSPITAL_BASED_OUTPATIENT_CLINIC_OR_DEPARTMENT_OTHER): Payer: Self-pay

## 2022-01-30 NOTE — Progress Notes (Unsigned)
   ANNUAL EXAM Patient name: TANEYA CONKEL MRN [id number]  Date of birth: [birth year] Chief Complaint:   No chief complaint on file.  History of Present Illness:   MOOREA BOISSONNEAULT is a 36 y.o. G0P0000 female being seen today for a routine annual exam.   Current complaints: ***  No LMP recorded. (Menstrual status: Oral contraceptives).  Current birth control: POP  Last pap: 10/09/2020. Results were: LSIL w/ HRHPV positive: type not specified. H/O abnormal pap: yes - She had the cryo on 01/28/21.  She had a h/o low grade paps and biopsies in 2021 and 2022.   She got one dose of Gardasil on 01/28/2021. ***   Health Maintenance Due  Topic Date Due   HPV VACCINES (2 - 3-dose SCDM series) 02/25/2021    Review of Systems:   Pertinent items are noted in HPI Denies any headaches, blurred vision, fatigue, shortness of breath, chest pain, abdominal pain, abnormal vaginal discharge/itching/odor/irritation, problems with periods, bowel movements, urination, or intercourse unless otherwise stated above. *** Pertinent History Reviewed:  Reviewed past medical,surgical, social and family history.  Reviewed problem list, medications and allergies. Physical Assessment:  There were no vitals filed for this visit.There is no height or weight on file to calculate BMI.   Physical Examination:  General appearance - well appearing, and in no distress Mental status - alert, oriented to person, place, and time Psych:  She has a normal mood and affect Skin - warm and dry, normal color, no suspicious lesions noted Chest - effort normal Heart - normal rate  Breasts - breasts appear normal, no suspicious masses, no skin or nipple changes or axillary nodes Abdomen - soft, nontender, nondistended, no masses or organomegaly Pelvic -  VULVA: normal appearing vulva with no masses, tenderness or lesions  VAGINA: normal appearing vagina with normal color and discharge, no lesions  CERVIX: normal appearing cervix  without discharge or lesions, no CMT UTERUS: uterus is felt to be normal size, shape, consistency and nontender  ADNEXA: No adnexal masses or tenderness noted. Extremities:  No swelling or varicosities noted  Chaperone present for exam  No results found for this or any previous visit (from the past 24 hour(s)).  Assessment & Plan:  Diagnoses and all orders for this visit:  Encounter for annual routine gynecological examination  - Cervical cancer screening: Discussed guidelines. Pap with HPV done - Gardasil:  Started but not completed - offered #2 today.  - GC/CT: {Blank single:19197::"accepts","declines","not indicated"} - Birth Control: {Birth control type:23956} - Breast Health: Encouraged self breast awareness/SBE. Teaching provided.  - F/U 12 months and prn     No orders of the defined types were placed in this encounter.   Meds: No orders of the defined types were placed in this encounter.   Follow-up: No follow-ups on file.  Radene Gunning, MD 01/30/2022 10:04 PM

## 2022-02-01 NOTE — Progress Notes (Signed)
Chief Complaint:   OBESITY Alison Davies is here to discuss her progress with her obesity treatment plan along with follow-up of her obesity related diagnoses. Alison Davies is on keeping a food journal and adhering to recommended goals of 1400-1500 calories and 120+ grams of protein daily and states she is following her eating plan approximately 70% of the time. Alison Davies states she is lifting weights for 60 minutes 2 times per week.  Today's visit was #: 5 Starting weight: 199 lbs Starting date: 07/26/2017 Today's weight: 173 lbs Today's date: 01/27/2022 Total lbs lost to date: 26 Total lbs lost since last in-office visit: 0  Interim History: Alison Davies has done well overall with weight loss.  She returned to work last week, but she is having difficulty managing her stress and work life balance.  Her hunger and cravings have been difficult due to stress over the past couple of weeks.  She feels Mancel Parsons is helping, but she notes some mild GI issues after being off for 3 weeks.  Subjective:   1. Vitamin D deficiency Alison Davies taking prescription vitamin D 50,000 units twice weekly, and she is tolerating it well with no side effects noted.  Assessment/Plan:   1. Vitamin D deficiency Alison Davies prescription Vitamin D 50,000 IU twice weekly and will follow-up for routine testing of Vitamin D, at least 2-3 times per year to avoid over-replacement.  2. Obesity, Current BMI 29.8 Alison Davies is currently in the action stage of change. As such, her goal is to continue with weight loss efforts. She has agreed to keeping a food journal and adhering to recommended goals of 1400-1500 calories and 120+ grams of protein daily.   We discussed concentrating on getting an protein first.  She would like to continue on her current dose of Wegovy 1.7 mg once weekly despite mild GI upset.  Exercise goals: As is.   Behavioral modification strategies: increasing lean protein intake, decreasing simple carbohydrates, dealing with family or  coworker sabotage, and avoiding temptations.  Alison Davies has agreed to follow-up with our clinic in 3 to 4 weeks. She was informed of the importance of frequent follow-up visits to maximize her success with intensive lifestyle modifications for her multiple health conditions.   Objective:   Blood pressure 113/80, pulse 92, temperature 98.6 F (37 C), height 5\' 4"  (1.626 m), weight 173 lb (78.5 kg), SpO2 96 %. Body mass index is 29.7 kg/m.  General: Cooperative, alert, well developed, in no acute distress. HEENT: Conjunctivae and lids unremarkable. Cardiovascular: Regular rhythm.  Lungs: Normal work of breathing. Neurologic: No focal deficits.   Lab Results  Component Value Date   CREATININE 0.63 11/16/2021   BUN 13 11/16/2021   NA 139 11/16/2021   K 4.4 11/16/2021   CL 103 11/16/2021   CO2 19 (L) 11/16/2021   Lab Results  Component Value Date   ALT 29 11/16/2021   AST 24 11/16/2021   ALKPHOS 82 11/16/2021   BILITOT 0.3 11/16/2021   Lab Results  Component Value Date   HGBA1C 4.6 07/19/2021   HGBA1C 4.8 02/24/2021   HGBA1C 4.9 03/24/2020   HGBA1C 4.8 10/23/2019   HGBA1C 4.8 06/13/2019   Lab Results  Component Value Date   INSULIN 12.9 02/24/2021   INSULIN 12.6 10/27/2020   INSULIN 18.6 03/24/2020   INSULIN 11.1 10/23/2019   INSULIN 15.4 06/13/2019   Lab Results  Component Value Date   TSH 2.36 07/19/2021   Lab Results  Component Value Date   CHOL 246 (H)  11/16/2021   HDL 74 11/16/2021   LDLCALC 159 (H) 11/16/2021   TRIG 75 11/16/2021   CHOLHDL 4.0 07/19/2021   Lab Results  Component Value Date   VD25OH 26.1 (L) 11/16/2021   VD25OH 18.2 (L) 02/24/2021   VD25OH 27.6 (L) 10/27/2020   Lab Results  Component Value Date   WBC 8.8 07/19/2021   HGB 14.1 07/19/2021   HCT 42.4 07/19/2021   MCV 93.0 07/19/2021   PLT 322 07/19/2021   No results found for: "IRON", "TIBC", "FERRITIN"  Attestation Statements:   Reviewed by clinician on day of visit: allergies,  medications, problem list, medical history, surgical history, family history, social history, and previous encounter notes.  Time spent on visit including pre-visit chart review and post-visit care and charting was 23 minutes.   I, Trixie Dredge, am acting as transcriptionist for Dennard Nip, MD.  I have reviewed the above documentation for accuracy and completeness, and I agree with the above. -  Dennard Nip, MD

## 2022-02-03 ENCOUNTER — Other Ambulatory Visit (HOSPITAL_BASED_OUTPATIENT_CLINIC_OR_DEPARTMENT_OTHER): Payer: Self-pay

## 2022-02-03 ENCOUNTER — Other Ambulatory Visit (HOSPITAL_COMMUNITY)
Admission: RE | Admit: 2022-02-03 | Discharge: 2022-02-03 | Disposition: A | Discharge status: Home / Self Care | Payer: No Typology Code available for payment source | Source: Ambulatory Visit | Attending: Obstetrics and Gynecology | Admitting: Obstetrics and Gynecology

## 2022-02-03 ENCOUNTER — Encounter: Payer: Self-pay | Admitting: Obstetrics and Gynecology

## 2022-02-03 ENCOUNTER — Ambulatory Visit (INDEPENDENT_AMBULATORY_CARE_PROVIDER_SITE_OTHER): Payer: No Typology Code available for payment source | Admitting: Obstetrics and Gynecology

## 2022-02-03 VITALS — BP 131/90 | HR 75 | Ht 64.0 in | Wt 177.0 lb

## 2022-02-03 DIAGNOSIS — Z23 Encounter for immunization: Secondary | ICD-10-CM | POA: Diagnosis not present

## 2022-02-03 DIAGNOSIS — Z3009 Encounter for other general counseling and advice on contraception: Secondary | ICD-10-CM

## 2022-02-03 DIAGNOSIS — Z113 Encounter for screening for infections with a predominantly sexual mode of transmission: Secondary | ICD-10-CM

## 2022-02-03 DIAGNOSIS — Z01419 Encounter for gynecological examination (general) (routine) without abnormal findings: Secondary | ICD-10-CM | POA: Diagnosis present

## 2022-02-03 MED ORDER — NORETHINDRONE 0.35 MG PO TABS
1.0000 | ORAL_TABLET | Freq: Every day | ORAL | 4 refills | Status: DC
  Filled 2022-02-03 – 2022-03-11 (×2): qty 84, 84d supply, fill #0

## 2022-02-03 NOTE — Addendum Note (Signed)
Addended by: Lyndal Rainbow on: 02/03/2022 01:33 PM   Modules accepted: Orders

## 2022-02-04 LAB — HIV ANTIBODY (ROUTINE TESTING W REFLEX): HIV 1&2 Ab, 4th Generation: NONREACTIVE

## 2022-02-04 LAB — HEPATITIS C ANTIBODY: Hepatitis C Ab: NONREACTIVE

## 2022-02-04 LAB — RPR: RPR Ser Ql: NONREACTIVE

## 2022-02-09 LAB — CYTOLOGY - PAP
Chlamydia: NEGATIVE
Comment: NEGATIVE
Comment: NEGATIVE
Comment: NEGATIVE
Comment: NEGATIVE
Comment: NORMAL
HPV 16: NEGATIVE
HPV 18 / 45: NEGATIVE
High risk HPV: POSITIVE — AB
Neisseria Gonorrhea: NEGATIVE

## 2022-02-10 ENCOUNTER — Telehealth: Payer: Self-pay | Admitting: *Deleted

## 2022-02-10 NOTE — Telephone Encounter (Signed)
-----   Message from Radene Gunning, MD sent at 02/09/2022 10:56 AM EDT ----- She will need colpo at some point

## 2022-02-10 NOTE — Telephone Encounter (Signed)
Left patient a message to call and schedule Colpo with Dr. Damita Dunnings.

## 2022-02-16 ENCOUNTER — Other Ambulatory Visit (HOSPITAL_BASED_OUTPATIENT_CLINIC_OR_DEPARTMENT_OTHER): Payer: Self-pay

## 2022-02-23 ENCOUNTER — Encounter (INDEPENDENT_AMBULATORY_CARE_PROVIDER_SITE_OTHER): Payer: Self-pay | Admitting: Family Medicine

## 2022-02-23 ENCOUNTER — Ambulatory Visit (INDEPENDENT_AMBULATORY_CARE_PROVIDER_SITE_OTHER): Payer: No Typology Code available for payment source | Admitting: Family Medicine

## 2022-02-23 VITALS — BP 112/76 | HR 95 | Temp 98.8°F | Ht 64.0 in | Wt 176.0 lb

## 2022-02-23 DIAGNOSIS — Z6830 Body mass index (BMI) 30.0-30.9, adult: Secondary | ICD-10-CM | POA: Diagnosis not present

## 2022-02-23 DIAGNOSIS — E669 Obesity, unspecified: Secondary | ICD-10-CM

## 2022-02-23 DIAGNOSIS — F439 Reaction to severe stress, unspecified: Secondary | ICD-10-CM

## 2022-02-28 NOTE — Progress Notes (Unsigned)
Chief Complaint:   OBESITY Alison Davies is here to discuss her progress with her obesity treatment plan along with follow-up of her obesity related diagnoses. Alison Davies is on {MWMwtlossportion/plan2:23431} and states she is following her eating plan approximately ***% of the time. Alison Davies states she is *** *** minutes *** times per week.  Today's visit was #: *** Starting weight: *** Starting date: *** Today's weight: *** Today's date: 02/23/2022 Total lbs lost to date: *** Total lbs lost since last in-office visit: ***  Interim History: ***  Subjective:   1. Stress ***  Assessment/Plan:   1. Stress ***  2. Obesity, Current BMI 30.2 Alison Davies is currently in the action stage of change. As such, her goal is to continue with weight loss efforts. She has agreed to keeping a food journal and adhering to recommended goals of 1400-1500 calories and 120+ grams of protein daily.   Alison Davies will continue [id number] and will continue to work on weight loss. We will reassess in 3-4 weeks.   Exercise goals: As is.   Behavioral modification strategies: increasing lean protein intake and travel eating strategies.  Alison Davies has agreed to follow-up with our clinic in 3 to 4 weeks. She was informed of the importance of frequent follow-up visits to maximize her success with intensive lifestyle modifications for her multiple health conditions.   Objective:   Blood pressure 112/76, pulse 95, temperature 98.8 F (37.1 C), height 5\' 4"  (1.626 m), weight 176 lb (79.8 kg), last menstrual period 11/10/2021, SpO2 98 %. Body mass index is 30.21 kg/m.  General: Cooperative, alert, well developed, in no acute distress. HEENT: Conjunctivae and lids unremarkable. Cardiovascular: Regular rhythm.  Lungs: Normal work of breathing. Neurologic: No focal deficits.   Lab Results  Component Value Date   CREATININE 0.63 11/16/2021   BUN 13 11/16/2021   NA 139 11/16/2021   K 4.4 11/16/2021   CL 103 11/16/2021   CO2 19  (L) 11/16/2021   Lab Results  Component Value Date   ALT 29 11/16/2021   AST 24 11/16/2021   ALKPHOS 82 11/16/2021   BILITOT 0.3 11/16/2021   Lab Results  Component Value Date   HGBA1C 4.6 07/19/2021   HGBA1C 4.8 02/24/2021   HGBA1C 4.9 03/24/2020   HGBA1C 4.8 10/23/2019   HGBA1C 4.8 06/13/2019   Lab Results  Component Value Date   INSULIN 12.9 02/24/2021   INSULIN 12.6 10/27/2020   INSULIN 18.6 03/24/2020   INSULIN 11.1 10/23/2019   INSULIN 15.4 06/13/2019   Lab Results  Component Value Date   TSH 2.36 07/19/2021   Lab Results  Component Value Date   CHOL 246 (H) 11/16/2021   HDL 74 11/16/2021   LDLCALC 159 (H) 11/16/2021   TRIG 75 11/16/2021   CHOLHDL 4.0 07/19/2021   Lab Results  Component Value Date   VD25OH 26.1 (L) 11/16/2021   VD25OH 18.2 (L) 02/24/2021   VD25OH 27.6 (L) 10/27/2020   Lab Results  Component Value Date   WBC 8.8 07/19/2021   HGB 14.1 07/19/2021   HCT 42.4 07/19/2021   MCV 93.0 07/19/2021   PLT 322 07/19/2021   No results found for: "IRON", "TIBC", "FERRITIN"  Attestation Statements:   Reviewed by clinician on day of visit: allergies, medications, problem list, medical history, surgical history, family history, social history, and previous encounter notes.  Time spent on visit including pre-visit chart review and post-visit care and charting was 20 minutes.   Wilhemena Durie, am acting as transcriptionist  for Dennard Nip, MD.  I have reviewed the above documentation for accuracy and completeness, and I agree with the above. -  ***

## 2022-03-01 ENCOUNTER — Other Ambulatory Visit (HOSPITAL_BASED_OUTPATIENT_CLINIC_OR_DEPARTMENT_OTHER): Payer: Self-pay

## 2022-03-01 MED ORDER — FLUARIX QUADRIVALENT 0.5 ML IM SUSY
PREFILLED_SYRINGE | INTRAMUSCULAR | 0 refills | Status: AC
  Filled 2022-03-01: qty 0.5, 1d supply, fill #0

## 2022-03-11 ENCOUNTER — Other Ambulatory Visit: Payer: Self-pay | Admitting: Family Medicine

## 2022-03-11 ENCOUNTER — Other Ambulatory Visit (HOSPITAL_BASED_OUTPATIENT_CLINIC_OR_DEPARTMENT_OTHER): Payer: Self-pay

## 2022-03-11 DIAGNOSIS — F411 Generalized anxiety disorder: Secondary | ICD-10-CM

## 2022-03-12 ENCOUNTER — Other Ambulatory Visit (HOSPITAL_BASED_OUTPATIENT_CLINIC_OR_DEPARTMENT_OTHER): Payer: Self-pay

## 2022-03-15 ENCOUNTER — Other Ambulatory Visit (HOSPITAL_BASED_OUTPATIENT_CLINIC_OR_DEPARTMENT_OTHER): Payer: Self-pay

## 2022-03-15 MED ORDER — ALPRAZOLAM 0.25 MG PO TABS
0.2500 mg | ORAL_TABLET | Freq: Every evening | ORAL | 0 refills | Status: DC | PRN
  Filled 2022-03-15: qty 30, 30d supply, fill #0

## 2022-03-16 ENCOUNTER — Other Ambulatory Visit (HOSPITAL_BASED_OUTPATIENT_CLINIC_OR_DEPARTMENT_OTHER): Payer: Self-pay

## 2022-03-21 ENCOUNTER — Encounter: Payer: No Typology Code available for payment source | Admitting: Obstetrics and Gynecology

## 2022-03-28 ENCOUNTER — Other Ambulatory Visit (HOSPITAL_BASED_OUTPATIENT_CLINIC_OR_DEPARTMENT_OTHER): Payer: Self-pay

## 2022-03-28 ENCOUNTER — Ambulatory Visit (INDEPENDENT_AMBULATORY_CARE_PROVIDER_SITE_OTHER): Payer: No Typology Code available for payment source | Admitting: Family Medicine

## 2022-03-28 ENCOUNTER — Encounter (INDEPENDENT_AMBULATORY_CARE_PROVIDER_SITE_OTHER): Payer: Self-pay | Admitting: Family Medicine

## 2022-03-28 VITALS — BP 124/88 | HR 75 | Temp 97.9°F | Ht 64.0 in | Wt 184.0 lb

## 2022-03-28 DIAGNOSIS — Z6831 Body mass index (BMI) 31.0-31.9, adult: Secondary | ICD-10-CM | POA: Diagnosis not present

## 2022-03-28 DIAGNOSIS — E669 Obesity, unspecified: Secondary | ICD-10-CM

## 2022-03-28 DIAGNOSIS — F439 Reaction to severe stress, unspecified: Secondary | ICD-10-CM | POA: Diagnosis not present

## 2022-03-28 MED ORDER — SEMAGLUTIDE-WEIGHT MANAGEMENT 1.7 MG/0.75ML ~~LOC~~ SOAJ
1.7000 mg | SUBCUTANEOUS | 0 refills | Status: DC
  Filled 2022-03-28: qty 3, 28d supply, fill #0

## 2022-04-05 NOTE — Progress Notes (Unsigned)
Chief Complaint:   OBESITY Alison Davies is here to discuss her progress with her obesity treatment plan along with follow-up of her obesity related diagnoses. Alison Davies is on keeping a food journal and adhering to recommended goals of 1400-1500 calories and 120+ grams of protein and states she is following her eating plan approximately 0% of the time. Alison Davies states she is doing 0 minutes 0 times per week.  Today's visit was #: 72 Starting weight: 199 lbs Starting date: 07/26/2017 Today's weight: 184 lbs Today's date: 03/28/2022 Total lbs lost to date: 15 Total lbs lost since last in-office visit: 0  Interim History: Alison Davies has fallen off track while dealing with a very stressful situation. She is making strategies and plans to get back on track.   Subjective:   1. Stress Alison Davies is working on decreasing the stressors in her life. She will be changing her jobs and she was able to relax on vacation.   Assessment/Plan:   1. Stress Alison Davies was offered support and encouragement. Stress reduction techniques were discussed.   2. Obesity, Current BMI 31.7 Alison Davies is currently in the action stage of change. As such, her goal is to continue with weight loss efforts. She has agreed to keeping a food journal and adhering to recommended goals of 1400-1500 calories and 120+ grams of protein daily.   Alison Davies will continue Wegovy 1.7 mg once weekly, and we will refill for 90 days.   - Semaglutide-Weight Management 1.7 MG/0.75ML SOAJ; INJECT 1.7 MG INTO THE SKIN ONCE A WEEK.  Dispense: 9 mL; Refill: 0  Exercise goals: For substantial health benefits, adults should do at least 150 minutes (2 hours and 30 minutes) a week of moderate-intensity, or 75 minutes (1 hour and 15 minutes) a week of vigorous-intensity aerobic physical activity, or an equivalent combination of moderate- and vigorous-intensity aerobic activity. Aerobic activity should be performed in episodes of at least 10 minutes, and preferably, it should be  spread throughout the week.  Behavioral modification strategies: increasing lean protein intake, meal planning and cooking strategies, and holiday eating strategies .  Alison Davies has agreed to follow-up with our clinic in 4 weeks. She was informed of the importance of frequent follow-up visits to maximize her success with intensive lifestyle modifications for her multiple health conditions.   Objective:   Blood pressure 124/88, pulse 75, temperature 97.9 F (36.6 C), height 5\' 4"  (1.626 m), weight 184 lb (83.5 kg), SpO2 99 %. Body mass index is 31.58 kg/m.  General: Cooperative, alert, well developed, in no acute distress. HEENT: Conjunctivae and lids unremarkable. Cardiovascular: Regular rhythm.  Lungs: Normal work of breathing. Neurologic: No focal deficits.   Lab Results  Component Value Date   CREATININE 0.63 11/16/2021   BUN 13 11/16/2021   NA 139 11/16/2021   K 4.4 11/16/2021   CL 103 11/16/2021   CO2 19 (L) 11/16/2021   Lab Results  Component Value Date   ALT 29 11/16/2021   AST 24 11/16/2021   ALKPHOS 82 11/16/2021   BILITOT 0.3 11/16/2021   Lab Results  Component Value Date   HGBA1C 4.6 07/19/2021   HGBA1C 4.8 02/24/2021   HGBA1C 4.9 03/24/2020   HGBA1C 4.8 10/23/2019   HGBA1C 4.8 06/13/2019   Lab Results  Component Value Date   INSULIN 12.9 02/24/2021   INSULIN 12.6 10/27/2020   INSULIN 18.6 03/24/2020   INSULIN 11.1 10/23/2019   INSULIN 15.4 06/13/2019   Lab Results  Component Value Date   TSH 2.36  07/19/2021   Lab Results  Component Value Date   CHOL 246 (H) 11/16/2021   HDL 74 11/16/2021   LDLCALC 159 (H) 11/16/2021   TRIG 75 11/16/2021   CHOLHDL 4.0 07/19/2021   Lab Results  Component Value Date   VD25OH 26.1 (L) 11/16/2021   VD25OH 18.2 (L) 02/24/2021   VD25OH 27.6 (L) 10/27/2020   Lab Results  Component Value Date   WBC 8.8 07/19/2021   HGB 14.1 07/19/2021   HCT 42.4 07/19/2021   MCV 93.0 07/19/2021   PLT 322 07/19/2021   No  results found for: "IRON", "TIBC", "FERRITIN"  Attestation Statements:   Reviewed by clinician on day of visit: allergies, medications, problem list, medical history, surgical history, family history, social history, and previous encounter notes.  Time spent on visit including pre-visit chart review and post-visit care and charting was 30 minutes.   I, Burt Knack, am acting as transcriptionist for Quillian Quince, MD.  I have reviewed the above documentation for accuracy and completeness, and I agree with the above. -  Quillian Quince, MD

## 2022-04-29 ENCOUNTER — Encounter (INDEPENDENT_AMBULATORY_CARE_PROVIDER_SITE_OTHER): Payer: Self-pay | Admitting: Family Medicine

## 2022-05-11 ENCOUNTER — Ambulatory Visit (INDEPENDENT_AMBULATORY_CARE_PROVIDER_SITE_OTHER): Payer: 59 | Admitting: Family Medicine

## 2022-06-07 ENCOUNTER — Ambulatory Visit: Payer: 59

## 2022-06-08 ENCOUNTER — Encounter (INDEPENDENT_AMBULATORY_CARE_PROVIDER_SITE_OTHER): Payer: Self-pay | Admitting: Family Medicine

## 2022-06-08 ENCOUNTER — Ambulatory Visit (INDEPENDENT_AMBULATORY_CARE_PROVIDER_SITE_OTHER): Payer: 59 | Admitting: Family Medicine

## 2022-06-08 VITALS — BP 121/79 | HR 112 | Temp 97.9°F | Ht 64.0 in | Wt 195.0 lb

## 2022-06-08 DIAGNOSIS — Z6833 Body mass index (BMI) 33.0-33.9, adult: Secondary | ICD-10-CM | POA: Diagnosis not present

## 2022-06-08 DIAGNOSIS — E559 Vitamin D deficiency, unspecified: Secondary | ICD-10-CM

## 2022-06-08 DIAGNOSIS — E88819 Insulin resistance, unspecified: Secondary | ICD-10-CM

## 2022-06-08 DIAGNOSIS — E669 Obesity, unspecified: Secondary | ICD-10-CM | POA: Diagnosis not present

## 2022-06-08 MED ORDER — SEMAGLUTIDE-WEIGHT MANAGEMENT 1 MG/0.5ML ~~LOC~~ SOAJ: 1.0000 mg | SUBCUTANEOUS | 0 refills | Status: DC

## 2022-06-10 ENCOUNTER — Encounter (INDEPENDENT_AMBULATORY_CARE_PROVIDER_SITE_OTHER): Payer: Self-pay | Admitting: Family Medicine

## 2022-06-14 NOTE — Telephone Encounter (Signed)
Ok to send to another pharmacy if needed

## 2022-06-20 ENCOUNTER — Other Ambulatory Visit (HOSPITAL_COMMUNITY)
Admission: RE | Admit: 2022-06-20 | Discharge: 2022-06-20 | Disposition: A | Discharge status: Home / Self Care | Payer: 59 | Source: Ambulatory Visit | Attending: Obstetrics & Gynecology | Admitting: Obstetrics & Gynecology

## 2022-06-20 ENCOUNTER — Ambulatory Visit (INDEPENDENT_AMBULATORY_CARE_PROVIDER_SITE_OTHER): Payer: 59 | Admitting: Obstetrics & Gynecology

## 2022-06-20 ENCOUNTER — Telehealth (INDEPENDENT_AMBULATORY_CARE_PROVIDER_SITE_OTHER): Payer: Self-pay | Admitting: Family Medicine

## 2022-06-20 VITALS — BP 117/81 | HR 82 | Ht 64.0 in | Wt 199.0 lb

## 2022-06-20 DIAGNOSIS — Z01812 Encounter for preprocedural laboratory examination: Secondary | ICD-10-CM | POA: Diagnosis not present

## 2022-06-20 DIAGNOSIS — R87810 Cervical high risk human papillomavirus (HPV) DNA test positive: Secondary | ICD-10-CM | POA: Diagnosis not present

## 2022-06-20 DIAGNOSIS — E669 Obesity, unspecified: Secondary | ICD-10-CM

## 2022-06-20 DIAGNOSIS — Z6833 Body mass index (BMI) 33.0-33.9, adult: Secondary | ICD-10-CM

## 2022-06-20 DIAGNOSIS — R87612 Low grade squamous intraepithelial lesion on cytologic smear of cervix (LGSIL): Secondary | ICD-10-CM | POA: Diagnosis present

## 2022-06-20 DIAGNOSIS — Z124 Encounter for screening for malignant neoplasm of cervix: Secondary | ICD-10-CM | POA: Insufficient documentation

## 2022-06-20 DIAGNOSIS — Z23 Encounter for immunization: Secondary | ICD-10-CM

## 2022-06-20 LAB — POCT URINE PREGNANCY: Preg Test, Ur: NEGATIVE

## 2022-06-20 NOTE — Telephone Encounter (Signed)
Patient needs a call about Wegovy. Patient did not enclose anymore information

## 2022-06-20 NOTE — Progress Notes (Signed)
Colposcopy Procedure Note  Indications: Pap smear 4 months ago showed: low-grade squamous intraepithelial neoplasia (LGSIL - encompassing HPV,mild dysplasia,CIN I). The prior pap showed low-grade squamous intraepithelial neoplasia (LGSIL - encompassing HPV,mild dysplasia,CIN I).  Prior cervical/vaginal disease: CIN 1. Prior cervical treatment: cryosurgery in 2022  Procedure Details  The risks and benefits of the procedure and Written informed consent obtained.  Speculum placed in vagina and excellent visualization of cervix achieved, cervix swabbed x 3 with acetic acid solution.  Findings: Cervix:  ? acetowhite lesion(s) noted at 9 o'clock; T zone not fully seen--most ; cervix swabbed with Lugol's solution, endocervical curettage performed, cervical biopsies taken at 9 o'clock, and specimen labelled and sent to pathology. Vaginal inspection: vaginal colposcopy not performed. Vulvar colposcopy: vulvar colposcopy not performed.  Specimens: ECC and cervical biopsy at 9 0'clock  Complications: none.  Plan: Specimens labelled and sent to Pathology. Will base further treatment on Pathology findings. Treatment options discussed with patient.  Given 3 years of CIN 1 and +HRHPV and failed cryo, I recommend LEEP  Pt also interest in IUI with donor sperm.  Pt will reach out to [facility] for a consult.

## 2022-06-21 ENCOUNTER — Other Ambulatory Visit (HOSPITAL_BASED_OUTPATIENT_CLINIC_OR_DEPARTMENT_OTHER): Payer: Self-pay

## 2022-06-21 ENCOUNTER — Encounter: Payer: Self-pay | Admitting: Obstetrics & Gynecology

## 2022-06-21 ENCOUNTER — Other Ambulatory Visit (HOSPITAL_COMMUNITY): Payer: Self-pay

## 2022-06-21 ENCOUNTER — Ambulatory Visit: Payer: 59

## 2022-06-21 DIAGNOSIS — N870 Mild cervical dysplasia: Secondary | ICD-10-CM | POA: Insufficient documentation

## 2022-06-21 LAB — SURGICAL PATHOLOGY

## 2022-06-21 MED ORDER — SEMAGLUTIDE-WEIGHT MANAGEMENT 1 MG/0.5ML ~~LOC~~ SOAJ
1.0000 mg | SUBCUTANEOUS | 0 refills | Status: DC
  Filled 2022-06-21: qty 2, 28d supply, fill #0

## 2022-06-21 NOTE — Telephone Encounter (Signed)
Patient called stating she would like medication sent to [facility]. Medication was sent.

## 2022-06-22 LAB — CYTOLOGY - PAP
Chlamydia: NEGATIVE
Comment: NEGATIVE
Comment: NEGATIVE
Comment: NEGATIVE
Comment: NEGATIVE
Comment: NEGATIVE
Comment: NORMAL
Diagnosis: UNDETERMINED — AB
HPV 16: NEGATIVE
HPV 18 / 45: NEGATIVE
High risk HPV: POSITIVE — AB
Neisseria Gonorrhea: NEGATIVE
Trichomonas: NEGATIVE

## 2022-06-22 NOTE — Progress Notes (Signed)
Chief Complaint:   OBESITY Kasee is here to discuss her progress with her obesity treatment plan along with follow-up of her obesity related diagnoses. Savonna is on keeping a food journal and adhering to recommended goals of 1400-1500 calories and 120+ grams of protein and states she is following her eating plan approximately 0% of the time. Akeyla states she is doing 0 minutes 0 times per week.  Today's visit was #: 33 Starting weight: 199 lbs Starting date: 07/26/2017 Today's weight: 195 lbs Today's date: 06/08/2022 Total lbs lost to date: 4 Total lbs lost since last in-office visit: 0  Interim History: Armetha has had a job change and she has been doing a lot of traveling, and working the night shift. Her routine fluctuates a lot and this makes meal planning very difficult. She has been off Wegovy.   Subjective:   1. Vitamin D deficiency Bernetta's Vitamin D level is not yet at goal. She is on high dose Vitamin D prescription.   2. Insulin resistance Jalia is struggling with her diet. She stopped her GLP-1 and notes increased polyphagia.   Assessment/Plan:   1. Vitamin D deficiency Marlesa will continue Vitamin D prescription, and we will recheck labs in 4 weeks.   2. Insulin resistance Devinne will change to the low carbohydrate plan and restart GLP-1. We will recheck labs in 1 month.   3. BMI 33.0-33.9,adult  4. Obesity, Beginning BMI 35.25 Meesha is currently in the action stage of change. As such, her goal is to continue with weight loss efforts. She has agreed to change to following a lower carbohydrate, vegetable and lean protein rich diet plan.   Restart Wegovy at 1 mg once weekly.   Behavioral modification strategies: increasing lean protein intake.  Victorya has agreed to follow-up with our clinic in 4 weeks. She was informed of the importance of frequent follow-up visits to maximize her success with intensive lifestyle modifications for her multiple health conditions.    Objective:   Blood pressure 121/79, pulse (!) 112, temperature 97.9 F (36.6 C), height 5' 4"$  (1.626 m), weight 195 lb (88.5 kg), SpO2 96 %. Body mass index is 33.47 kg/m.  General: Cooperative, alert, well developed, in no acute distress. HEENT: Conjunctivae and lids unremarkable. Cardiovascular: Regular rhythm.  Lungs: Normal work of breathing. Neurologic: No focal deficits.   Lab Results  Component Value Date   CREATININE 0.63 11/16/2021   BUN 13 11/16/2021   NA 139 11/16/2021   K 4.4 11/16/2021   CL 103 11/16/2021   CO2 19 (L) 11/16/2021   Lab Results  Component Value Date   ALT 29 11/16/2021   AST 24 11/16/2021   ALKPHOS 82 11/16/2021   BILITOT 0.3 11/16/2021   Lab Results  Component Value Date   HGBA1C 4.6 07/19/2021   HGBA1C 4.8 02/24/2021   HGBA1C 4.9 03/24/2020   HGBA1C 4.8 10/23/2019   HGBA1C 4.8 06/13/2019   Lab Results  Component Value Date   INSULIN 12.9 02/24/2021   INSULIN 12.6 10/27/2020   INSULIN 18.6 03/24/2020   INSULIN 11.1 10/23/2019   INSULIN 15.4 06/13/2019   Lab Results  Component Value Date   TSH 2.36 07/19/2021   Lab Results  Component Value Date   CHOL 246 (H) 11/16/2021   HDL 74 11/16/2021   LDLCALC 159 (H) 11/16/2021   TRIG 75 11/16/2021   CHOLHDL 4.0 07/19/2021   Lab Results  Component Value Date   VD25OH 26.1 (L) 11/16/2021   VD25OH  18.2 (L) 02/24/2021   VD25OH 27.6 (L) 10/27/2020   Lab Results  Component Value Date   WBC 8.8 07/19/2021   HGB 14.1 07/19/2021   HCT 42.4 07/19/2021   MCV 93.0 07/19/2021   PLT 322 07/19/2021   No results found for: "IRON", "TIBC", "FERRITIN"  Attestation Statements:   Reviewed by clinician on day of visit: allergies, medications, problem list, medical history, surgical history, family history, social history, and previous encounter notes.   I, Trixie Dredge, am acting as transcriptionist for Dennard Nip, MD.  I have reviewed the above documentation for accuracy and  completeness, and I agree with the above. -  Dennard Nip, MD

## 2022-06-23 ENCOUNTER — Telehealth: Payer: Self-pay | Admitting: *Deleted

## 2022-06-23 NOTE — Telephone Encounter (Signed)
Left patient a message to call and reschedule Leep appointment on 07/18/2022 due to RN not being here to assist with procedure.

## 2022-07-01 ENCOUNTER — Other Ambulatory Visit (HOSPITAL_BASED_OUTPATIENT_CLINIC_OR_DEPARTMENT_OTHER): Payer: Self-pay

## 2022-07-05 DIAGNOSIS — Z23 Encounter for immunization: Secondary | ICD-10-CM | POA: Diagnosis not present

## 2022-07-12 ENCOUNTER — Ambulatory Visit (INDEPENDENT_AMBULATORY_CARE_PROVIDER_SITE_OTHER): Payer: 59 | Admitting: Family Medicine

## 2022-07-18 ENCOUNTER — Encounter: Payer: 59 | Admitting: Obstetrics & Gynecology

## 2022-07-25 ENCOUNTER — Encounter: Payer: Self-pay | Admitting: Family Medicine

## 2022-07-25 ENCOUNTER — Ambulatory Visit: Payer: 59 | Admitting: Obstetrics & Gynecology

## 2022-07-25 ENCOUNTER — Other Ambulatory Visit (HOSPITAL_COMMUNITY)
Admission: RE | Admit: 2022-07-25 | Discharge: 2022-07-25 | Disposition: A | Discharge status: Home / Self Care | Payer: 59 | Source: Ambulatory Visit | Attending: Obstetrics & Gynecology | Admitting: Obstetrics & Gynecology

## 2022-07-25 ENCOUNTER — Ambulatory Visit (INDEPENDENT_AMBULATORY_CARE_PROVIDER_SITE_OTHER): Payer: 59 | Admitting: Family Medicine

## 2022-07-25 ENCOUNTER — Encounter: Payer: Self-pay | Admitting: Obstetrics & Gynecology

## 2022-07-25 VITALS — BP 123/70 | HR 83 | Resp 16 | Ht 64.0 in | Wt 198.0 lb

## 2022-07-25 DIAGNOSIS — E669 Obesity, unspecified: Secondary | ICD-10-CM

## 2022-07-25 DIAGNOSIS — N870 Mild cervical dysplasia: Secondary | ICD-10-CM

## 2022-07-25 DIAGNOSIS — G43709 Chronic migraine without aura, not intractable, without status migrainosus: Secondary | ICD-10-CM | POA: Diagnosis not present

## 2022-07-25 DIAGNOSIS — Z3202 Encounter for pregnancy test, result negative: Secondary | ICD-10-CM

## 2022-07-25 DIAGNOSIS — F411 Generalized anxiety disorder: Secondary | ICD-10-CM

## 2022-07-25 LAB — POCT URINE PREGNANCY: Preg Test, Ur: NEGATIVE

## 2022-07-25 MED ORDER — SUMATRIPTAN SUCCINATE 25 MG PO TABS: 25.0000 mg | ORAL_TABLET | ORAL | 2 refills | Status: DC | PRN

## 2022-07-25 MED ORDER — TOPIRAMATE 100 MG PO TABS: 100.0000 mg | ORAL_TABLET | Freq: Every day | ORAL | 1 refills | Status: DC

## 2022-07-25 MED ORDER — SERTRALINE HCL 100 MG PO TABS: 150.0000 mg | ORAL_TABLET | Freq: Every day | ORAL | 1 refills | Status: DC

## 2022-07-25 NOTE — Progress Notes (Signed)
Alison Davies - 37 y.o. female MRN [id number]  Date of birth: 08-06-1985  Subjective Chief Complaint  Patient presents with   Anxiety   Migraine    HPI Alison Davies is a 37 y.o. female here today for follow up visit.   She reports she is doing ok.  Having significant discomfort from the procedure she had earlier today.  She is concerned about future fertility after having LEEP and would like to meet with a fertility specialist.  She continues to take topiramate for prevention of migraines with Imitrex as needed for breakthrough.  This continues to work quite well for her.  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  Anxiety is fairly stable with sertraline at current strength.  Has alprazolam as needed on rare occasion for anxiety.    He does continue to see healthy weight and wellness management of her weight.  Remains on semaglutide  ROS:  A comprehensive ROS was completed and negative except as noted per HPI   No Known Allergies  Past Medical History:  Diagnosis Date   Anxiety    Hyperlipidemia    Leg edema    Migraines    Vaginal Pap smear, abnormal 06/12/2019   LGSIL +HPV    Past Surgical History:  Procedure Laterality Date   COLPOSCOPY N/A    CRYOTHERAPY  12/2020   cervix    Social History   Socioeconomic History   Marital status: Single    Spouse name: Not on file   Number of children: Not on file   Years of education: Not on file   Highest education level: Not on file  Occupational History   Occupation: Rad Engineer, production: [employer]  Tobacco Use   Smoking status: Never   Smokeless tobacco: Never  Vaping Use   Vaping Use: Never used  Substance and Sexual Activity   Alcohol use: Yes    Comment: occasional   Drug use: Never   Sexual activity: Yes    Birth control/protection: Pill  Other Topics Concern   Not on file  Social History Narrative   Not on  file   Social Determinants of Health   Financial Resource Strain: Not on file  Food Insecurity: Not on file  Transportation Needs: Not on file  Physical Activity: Not on file  Stress: Not on file  Social Connections: Not on file    Family History  Problem Relation Age of Onset   Anxiety disorder Mother    Diabetes Father    Obesity Father     Health Maintenance  Topic Date Due   COVID-19 Vaccine (6 - 2023-24 season) 12/31/2021   PAP SMEAR-Modifier  06/20/2025   DTaP/Tdap/Td (3 - Td or Tdap) 03/04/2028   INFLUENZA VACCINE  Completed   HPV VACCINES  Completed   Hepatitis C Screening  Completed   HIV Screening  Completed     ----------------------------------------------------------------------------------------------------------------------------------------------------------------------------------------------------------------- Physical Exam There were no vitals taken for this visit.  Physical Exam Constitutional:      Appearance: Normal appearance.  HENT:     Head: Normocephalic and atraumatic.  Eyes:     General: No scleral icterus. Cardiovascular:     Rate and Rhythm: Normal rate and regular rhythm.  Pulmonary:     Effort: Pulmonary effort is normal.     Breath sounds: Normal breath sounds.  Musculoskeletal:     Cervical back: Neck supple.  Neurological:     Mental Status: She is alert.  Psychiatric:        Mood and Affect: Mood normal.        Behavior: Behavior normal.     ------------------------------------------------------------------------------------------------------------------------------------------------------------------------------------------------------------------- Assessment and Plan  Migraine headache Continue topiramate for migraine prophylaxis.  Imitrex as needed for breakthrough.  GAD (generalized anxiety disorder) Fairly stable with combination of sertraline and rare use of alprazolam for severe anxiety.  Stress is overall  better since moving to traveling  Obesity (BMI 30.0-34.9) Followed by healthy weight and wellness.   Meds ordered this encounter  Medications   sertraline (ZOLOFT) 100 MG tablet    Sig: Take 1&1/2 tablets (150 mg total) by mouth daily.    Dispense:  135 tablet    Refill:  1   SUMAtriptan (IMITREX) 25 MG tablet    Sig: Take 1 tablet (25 mg total) by mouth every 2 (two) hours as needed for migraine. May repeat in 2 hours if headache persists or recurs.    Dispense:  10 tablet    Refill:  2   topiramate (TOPAMAX) 100 MG tablet    Sig: Take 1 tablet (100 mg total) by mouth at bedtime.    Dispense:  90 tablet    Refill:  1    Return in about 6 months (around 01/25/2023) for F/u Migraines.    This visit occurred during the SARS-CoV-2 public health emergency.  Safety protocols were in place, including screening questions prior to the visit, additional usage of staff PPE, and extensive cleaning of exam room while observing appropriate contact time as indicated for disinfecting solutions.

## 2022-07-25 NOTE — Assessment & Plan Note (Signed)
Continue topiramate for migraine prophylaxis.  Imitrex as needed for breakthrough.

## 2022-07-25 NOTE — Progress Notes (Signed)
Patient ID: Alison Davies, female   DOB: 1986-02-24, 37 y.o.   MRN: [id number]  LEEP PROCEDURE NOTE Pap smear and colposcopy reviewed.   Pap ASCUS, +HPV Colpo Biopsy CIN 1 ECC CIN 1 3 years of CIN 1 and +HRHPV and failed cryo, I recommend LEEP   Risks, benefits, alternatives, and limitations of procedure explained to patient, including pain, bleeding, infection, failure to remove abnormal tissue and failure to cure dysplasia, need for repeat procedures, damage to pelvic organs, cervical incompetence.  Role of HPV,cervical dysplasia and need for close followup was empasized. Informed written consent was obtained. All questions were answered. Time out performed.  ??Procedure: The patient was placed in lithotomy position and the bivalved coated speculum was placed in the patient's vagina. A grounding pad placed on the patient. Lugol's solution was applied to the cervix and areas of decreased uptake were noted near os (can't see edge)  Local anesthesia was administered via an intracervical block using 10cc of 2% Lidocaine with epinephrine. The suction was turned on and the Small 1X Fisher Cone Biopsy Excisor on 94 Watts of cutting current was used to excise the area of decreased uptake and excise the entire transformation zone. Pt moved during hte procedure which cause a centimeter deviation around 7 0'clock.  Leep tool was readjusted and LEEP completed. One stitch of 3-0 vicryl was used to aid in hemostasis.  Monsel's and roller ball coagulation set at 50 Watts coagulation current were used to achieve excellent hemostasis. The speculum was removed from the vagina. Specimens were sent to pathology. ?The patient tolerated the procedure well. Post-operative instructions given to patient, including instruction to seek medical attention for persistent bright red bleeding, fever, abdominal/pelvic pain, dysuria, nausea or vomiting. She was also told about the possibility of having copious yellow to black tinged  discharge. She was counseled to avoid anything in the vagina (sex/douching/tampons) for 4 weeks. She has a  3 week post-operative check to review results and assess wound healing. Follow up in 4 months for repeat pap or as needed.

## 2022-07-25 NOTE — Progress Notes (Signed)
UPT - Negative

## 2022-07-25 NOTE — Assessment & Plan Note (Signed)
Fairly stable with combination of sertraline and rare use of alprazolam for severe anxiety.  Stress is overall better since moving to traveling

## 2022-07-25 NOTE — Assessment & Plan Note (Signed)
Followed by healthy weight and wellness.

## 2022-07-26 ENCOUNTER — Other Ambulatory Visit: Payer: Self-pay | Admitting: Obstetrics & Gynecology

## 2022-07-26 ENCOUNTER — Other Ambulatory Visit (HOSPITAL_BASED_OUTPATIENT_CLINIC_OR_DEPARTMENT_OTHER): Payer: Self-pay

## 2022-07-26 ENCOUNTER — Encounter: Payer: Self-pay | Admitting: Obstetrics & Gynecology

## 2022-07-26 MED ORDER — DOXYCYCLINE HYCLATE 100 MG PO CAPS
100.0000 mg | ORAL_CAPSULE | Freq: Two times a day (BID) | ORAL | 0 refills | Status: DC
  Filled 2022-07-26: qty 14, 7d supply, fill #0

## 2022-07-27 ENCOUNTER — Ambulatory Visit (INDEPENDENT_AMBULATORY_CARE_PROVIDER_SITE_OTHER): Payer: 59 | Admitting: Family Medicine

## 2022-07-27 ENCOUNTER — Encounter (INDEPENDENT_AMBULATORY_CARE_PROVIDER_SITE_OTHER): Payer: Self-pay | Admitting: Family Medicine

## 2022-07-27 VITALS — BP 118/74 | HR 88 | Temp 97.9°F | Ht 64.0 in | Wt 193.0 lb

## 2022-07-27 DIAGNOSIS — Z6833 Body mass index (BMI) 33.0-33.9, adult: Secondary | ICD-10-CM

## 2022-07-27 DIAGNOSIS — R632 Polyphagia: Secondary | ICD-10-CM

## 2022-07-27 DIAGNOSIS — E559 Vitamin D deficiency, unspecified: Secondary | ICD-10-CM

## 2022-07-27 DIAGNOSIS — E669 Obesity, unspecified: Secondary | ICD-10-CM

## 2022-07-27 LAB — SURGICAL PATHOLOGY

## 2022-07-27 MED ORDER — VITAMIN D (ERGOCALCIFEROL) 1.25 MG (50000 UNIT) PO CAPS: ORAL_CAPSULE | ORAL | 0 refills | Status: DC

## 2022-07-27 MED ORDER — SEMAGLUTIDE-WEIGHT MANAGEMENT 1 MG/0.5ML ~~LOC~~ SOAJ: 1.0000 mg | SUBCUTANEOUS | 0 refills | Status: AC

## 2022-07-28 ENCOUNTER — Telehealth (INDEPENDENT_AMBULATORY_CARE_PROVIDER_SITE_OTHER): Payer: Self-pay | Admitting: Family Medicine

## 2022-07-28 ENCOUNTER — Telehealth (INDEPENDENT_AMBULATORY_CARE_PROVIDER_SITE_OTHER): Payer: Self-pay | Admitting: *Deleted

## 2022-07-28 LAB — VITAMIN D 25 HYDROXY (VIT D DEFICIENCY, FRACTURES): Vit D, 25-Hydroxy: 17.5 ng/mL — ABNORMAL LOW (ref 30.0–100.0)

## 2022-07-28 NOTE — Telephone Encounter (Signed)
Pt called 07/28/22 went to the pharmacy was told she need a Prior Auth for her [facility]. She use the [facility] [address]

## 2022-07-28 NOTE — Telephone Encounter (Signed)
Attempted to submit PA via CoverMymeds per patient's request, however insurance information is not matching. Patient has been sent a Mychart message to supply correct information to proceed.

## 2022-08-01 NOTE — Telephone Encounter (Signed)
Please refer to next encounter, patient sent Mychart message

## 2022-08-01 NOTE — Progress Notes (Unsigned)
Chief Complaint:   OBESITY Patiance is here to discuss her progress with her obesity treatment plan along with follow-up of her obesity related diagnoses. Traca is on following a lower carbohydrate, vegetable and lean protein rich diet plan and states she is following her eating plan approximately 0% of the time. Alison Davies states she is doing 0 minutes 0 times per week.  Today's visit was #: 22 Starting weight: 199 lbs Starting date: 07/26/2017 Today's weight: 193 lbs Today's date: 07/27/2022 Total lbs lost to date: 6 Total lbs lost since last in-office visit: 2  Interim History: Alison Davies has done well with her weight loss. She is doing factor meals in the weekends.   Subjective:   1. Vitamin D deficiency Alison Davies's last Vitamin D level was very low. She is on Vitamin D prescription and she is due to have labs rechecked.   2. Polyphagia Alison Davies's restarted [id number] and she had some initial nausea. She took some Zoloft but she hopes this calms down like it did previously.   Assessment/Plan:   1. Vitamin D deficiency We will check labs today, and we will refill prescription Vitamin D for 90 days.   - VITAMIN D 25 Hydroxy (Vit-D Deficiency, Fractures) - Vitamin D, Ergocalciferol, (DRISDOL) 1.25 MG (50000 UNIT) CAPS capsule; TAKE 1 CAPSULE (50,000 UNITS TOTAL) BY MOUTH 2 (TWO) TIMES A WEEK.  Dispense: 24 capsule; Refill: 0  2. Polyphagia Alison Davies will continue Wegovy, and we will refill for 90 days.   - Semaglutide-Weight Management 1 MG/0.5ML SOAJ; Inject 1 mg into the skin once a week.  Dispense: 6 mL; Refill: 0  3. BMI 33.0-33.9,adult  4. Obesity, Beginning BMI 35.25 Alison Davies is currently in the action stage of change. As such, her goal is to continue with weight loss efforts. She has agreed to practicing portion control and making smarter food choices, such as increasing vegetables and decreasing simple carbohydrates.   Behavioral modification strategies: no skipping meals.  Alison Davies has  agreed to follow-up with our clinic in 4 weeks. She was informed of the importance of frequent follow-up visits to maximize her success with intensive lifestyle modifications for her multiple health conditions.   Alison Davies was informed we would discuss her lab results at her next visit unless there is a critical issue that needs to be addressed sooner. Alison Davies agreed to keep her next visit at the agreed upon time to discuss these results.  Objective:   Blood pressure 118/74, pulse 88, temperature 97.9 F (36.6 C), height 5\' 4"  (1.626 m), weight 193 lb (87.5 kg), SpO2 95 %. Body mass index is 33.13 kg/m.  Lab Results  Component Value Date   CREATININE 0.63 11/16/2021   BUN 13 11/16/2021   NA 139 11/16/2021   K 4.4 11/16/2021   CL 103 11/16/2021   CO2 19 (L) 11/16/2021   Lab Results  Component Value Date   ALT 29 11/16/2021   AST 24 11/16/2021   ALKPHOS 82 11/16/2021   BILITOT 0.3 11/16/2021   Lab Results  Component Value Date   HGBA1C 4.6 07/19/2021   HGBA1C 4.8 02/24/2021   HGBA1C 4.9 03/24/2020   HGBA1C 4.8 10/23/2019   HGBA1C 4.8 06/13/2019   Lab Results  Component Value Date   INSULIN 12.9 02/24/2021   INSULIN 12.6 10/27/2020   INSULIN 18.6 03/24/2020   INSULIN 11.1 10/23/2019   INSULIN 15.4 06/13/2019   Lab Results  Component Value Date   TSH 2.36 07/19/2021   Lab Results  Component Value  Date   CHOL 246 (H) 11/16/2021   HDL 74 11/16/2021   LDLCALC 159 (H) 11/16/2021   TRIG 75 11/16/2021   CHOLHDL 4.0 07/19/2021   Lab Results  Component Value Date   VD25OH 17.5 (L) 07/27/2022   VD25OH 26.1 (L) 11/16/2021   VD25OH 18.2 (L) 02/24/2021   Lab Results  Component Value Date   WBC 8.8 07/19/2021   HGB 14.1 07/19/2021   HCT 42.4 07/19/2021   MCV 93.0 07/19/2021   PLT 322 07/19/2021   No results found for: "IRON", "TIBC", "FERRITIN"  Attestation Statements:   Reviewed by clinician on day of visit: allergies, medications, problem list, medical history,  surgical history, family history, social history, and previous encounter notes.   I, Trixie Dredge, am acting as transcriptionist for Dennard Nip, MD.  I have reviewed the above documentation for accuracy and completeness, and I agree with the above. -  Dennard Nip, MD

## 2022-08-01 NOTE — Telephone Encounter (Signed)
Shelbi Staley (Key: [id number])  Your demographic data has been sent to [facility] successfully!  [employer] typically takes 5-10 minutes to respond, but it may take a little longer in some cases. You will be notified by email when available. You can also check for an update later by opening this request from your dashboard. Please do not fax or call [employer] to resubmit this request. If you need assistance, please chat with CoverMyMeds or call us at [phone].  If it has been longer than 24 hours, please reach out to [facility].

## 2022-08-15 ENCOUNTER — Ambulatory Visit: Payer: 59 | Admitting: Obstetrics & Gynecology

## 2022-08-29 ENCOUNTER — Ambulatory Visit: Payer: 59 | Admitting: Obstetrics and Gynecology

## 2022-08-29 ENCOUNTER — Encounter: Payer: Self-pay | Admitting: Obstetrics and Gynecology

## 2022-08-29 VITALS — BP 120/72 | HR 74 | Ht 64.0 in | Wt 199.0 lb

## 2022-08-29 DIAGNOSIS — Z09 Encounter for follow-up examination after completed treatment for conditions other than malignant neoplasm: Secondary | ICD-10-CM

## 2022-08-29 DIAGNOSIS — N870 Mild cervical dysplasia: Secondary | ICD-10-CM

## 2022-08-29 NOTE — Progress Notes (Signed)
   RETURN GYNECOLOGY VISIT  Subjective:  Alison Davies is a 37 y.o. who is 5 weeks s/p LEEP for persistent CIN1 x 3 years presenting for post LEEP follow up  Reports resolution of discharge & bleeding. Had a couple days of cramping but no further pain. Tolerating PO. No fevers/chills. Had intercourse recently with spotting afterwards.  Reviewed path from LEEP - CIN 1 extending to ectocervical margins  Dysplasia Hx: I personally reviewed the results listed below. 07/25/2022  Surgical pathology( [place]/ [facility])  *Excision: CIN1, Margins Positive Important  SURGICAL PATHOLOGY:   ...View Details in Report HPV testing in 6 months, Pap in 6 months Responsible Provider: Lennart Pall, MD Due Date: 01/25/2023  06/20/2022  Surgical pathology( [place]/ [facility])  *Colposcopy: CIN1 Important    06/20/2022  Cytology - PAP( [place])  *Pap Smear: ASC-US Important  *HPV: HRHPV +, HRHPV 16 -, HRHPV 18 - Important    02/03/2022  Cytology - PAP( [place])  *Pap Smear: LSIL Important  *HPV: HRHPV +, HRHPV 16 -, HRHPV 18 - Important    12/30/2020  Surgical pathology  *Colposcopy: CIN1 Important    10/09/2020  Cytology - PAP  *Pap Smear: LSIL Important  *HPV: HRHPV + Important    08/07/2019  Surgical pathology( [place]/ [facility])  *Colposcopy: CIN1 Important    06/12/2019  Cytology - PAP( [place])  *Pap Smear: LSIL Important  *HPV: HRHPV + Important     Objective:   Vitals:   08/29/22 0921  BP: 120/72  Pulse: 74  Weight: 199 lb (90.3 kg)  Height: 5\' 4"  (1.626 m)    General:  Alert, oriented and cooperative. Patient is in no acute distress.  Skin: Skin is warm and dry. No rash noted.   Cardiovascular: Normal heart rate noted  Respiratory: Normal respiratory effort, no problems with respiration noted  Abdomen: Soft, non-tender, non-distended   Pelvic: NEFG. Cervix healing well with no abnormal discharge. Small 3mm area of granulation tissue present at 8  o'clock.  Exam performed in the presence of a chaperone  Assessment and Plan:  Alison Davies is a 37 y.o. with persistent CIN1 presenting for post-LEEP follow up  Dysplasia of cervix, low grade (CIN 1) Postoperative examination Reviewed path results Discussed avoidance of intercourse for additional 2 weeks to allow complete healing of cervix Cotesting in 6 months  Return in about 6 months (around 02/28/2023) for pap & HPV test.  Future Appointments  Date Time Provider Department Center  10/19/2022  9:00 AM Quillian Quince D, MD [facility] None  01/25/2023  8:10 AM Everrett Coombe, DO [facility] None   Lennart Pall, MD

## 2022-08-29 NOTE — Patient Instructions (Signed)
Please avoid intercourse for 2 more weeks to allow your cervix to fully heal.

## 2022-09-24 ENCOUNTER — Encounter (INDEPENDENT_AMBULATORY_CARE_PROVIDER_SITE_OTHER): Payer: Self-pay | Admitting: Family Medicine

## 2022-10-12 ENCOUNTER — Ambulatory Visit (INDEPENDENT_AMBULATORY_CARE_PROVIDER_SITE_OTHER): Payer: 59 | Admitting: Family Medicine

## 2022-10-19 ENCOUNTER — Ambulatory Visit (INDEPENDENT_AMBULATORY_CARE_PROVIDER_SITE_OTHER): Payer: 59 | Admitting: Family Medicine

## 2022-11-27 ENCOUNTER — Encounter: Payer: Self-pay | Admitting: Family Medicine

## 2022-11-27 DIAGNOSIS — Z3009 Encounter for other general counseling and advice on contraception: Secondary | ICD-10-CM

## 2022-11-27 DIAGNOSIS — F411 Generalized anxiety disorder: Secondary | ICD-10-CM

## 2022-11-27 DIAGNOSIS — G43709 Chronic migraine without aura, not intractable, without status migrainosus: Secondary | ICD-10-CM

## 2023-01-25 ENCOUNTER — Ambulatory Visit: Payer: 59 | Admitting: Family Medicine

## 2023-02-01 ENCOUNTER — Ambulatory Visit: Payer: 59 | Admitting: Family Medicine

## 2023-02-01 ENCOUNTER — Other Ambulatory Visit: Payer: Self-pay | Admitting: Family Medicine

## 2023-02-02 ENCOUNTER — Other Ambulatory Visit: Payer: Self-pay | Admitting: Family Medicine

## 2023-02-02 DIAGNOSIS — G43709 Chronic migraine without aura, not intractable, without status migrainosus: Secondary | ICD-10-CM

## 2023-02-02 DIAGNOSIS — F411 Generalized anxiety disorder: Secondary | ICD-10-CM

## 2023-02-02 MED ORDER — SERTRALINE HCL 100 MG PO TABS: 150.0000 mg | ORAL_TABLET | Freq: Every day | ORAL | 0 refills | Status: DC

## 2023-02-02 MED ORDER — SUMATRIPTAN SUCCINATE 25 MG PO TABS
25.0000 mg | ORAL_TABLET | ORAL | 2 refills | Status: DC | PRN
Start: 2023-02-02 — End: 2023-04-17

## 2023-02-02 MED ORDER — TOPIRAMATE 100 MG PO TABS
100.0000 mg | ORAL_TABLET | Freq: Every day | ORAL | 0 refills | Status: DC
Start: 2023-02-02 — End: 2023-04-17

## 2023-04-17 ENCOUNTER — Ambulatory Visit: Payer: 59 | Admitting: Family Medicine

## 2023-04-17 ENCOUNTER — Encounter: Payer: Self-pay | Admitting: Family Medicine

## 2023-04-17 VITALS — BP 126/86 | HR 77 | Temp 98.4°F | Ht 64.0 in | Wt 205.0 lb

## 2023-04-17 DIAGNOSIS — F411 Generalized anxiety disorder: Secondary | ICD-10-CM | POA: Diagnosis not present

## 2023-04-17 DIAGNOSIS — E66811 Obesity, class 1: Secondary | ICD-10-CM

## 2023-04-17 DIAGNOSIS — G43709 Chronic migraine without aura, not intractable, without status migrainosus: Secondary | ICD-10-CM

## 2023-04-17 MED ORDER — SEMAGLUTIDE-WEIGHT MANAGEMENT 1 MG/0.5ML ~~LOC~~ SOAJ: 1.0000 mg | SUBCUTANEOUS | 3 refills | Status: AC

## 2023-04-17 MED ORDER — SERTRALINE HCL 100 MG PO TABS: 150.0000 mg | ORAL_TABLET | Freq: Every day | ORAL | 3 refills | Status: DC

## 2023-04-17 MED ORDER — SUMATRIPTAN SUCCINATE 25 MG PO TABS: 25.0000 mg | ORAL_TABLET | ORAL | 6 refills | Status: AC | PRN

## 2023-04-17 MED ORDER — SEMAGLUTIDE-WEIGHT MANAGEMENT 0.25 MG/0.5ML ~~LOC~~ SOAJ: 0.2500 mg | SUBCUTANEOUS | 0 refills | Status: AC

## 2023-04-17 MED ORDER — ALPRAZOLAM 0.25 MG PO TABS: 0.2500 mg | ORAL_TABLET | Freq: Every evening | ORAL | 1 refills | Status: AC | PRN

## 2023-04-17 MED ORDER — TOPIRAMATE 100 MG PO TABS: 100.0000 mg | ORAL_TABLET | Freq: Every day | ORAL | 3 refills | Status: DC

## 2023-04-17 MED ORDER — SEMAGLUTIDE-WEIGHT MANAGEMENT 0.5 MG/0.5ML ~~LOC~~ SOAJ: 0.5000 mg | SUBCUTANEOUS | 0 refills | Status: AC

## 2023-04-17 NOTE — Assessment & Plan Note (Signed)
She did well with Wegovy previously.  Adding this back on.

## 2023-04-17 NOTE — Assessment & Plan Note (Signed)
Fairly stable with combination of sertraline and rare use of alprazolam for severe anxiety.  Stress is overall better since moving to traveling

## 2023-04-17 NOTE — Progress Notes (Signed)
Alison Davies - 37 y.o. female MRN [id number]  Date of birth: 1986/04/15  Subjective Chief Complaint  Patient presents with   Medical Management of Chronic Issues    HPI Alison Davies is a 37 y.o. female here today for follow up.   She reports that she is doing pretty well.  Had some eye irritation and blurred vision recently.  This is better and she has appt with eye doctor this afternoon.    Remains on topiramate daily with imitrex as needed for migraine management.  Migraines are well controlled with this at current strength.  No significant side effects with this at current strength  Remains on zoloft daily with xanax as needed for anxiety and panic.  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 [employer]  ROS:  A comprehensive ROS was completed and negative except as noted per HPI  No Known Allergies  Past Medical History:  Diagnosis Date   Anxiety    Hyperlipidemia    Leg edema    Migraines    Vaginal Pap smear, abnormal 06/12/2019   LGSIL +HPV    Past Surgical History:  Procedure Laterality Date   COLPOSCOPY N/A    CRYOTHERAPY  12/2020   cervix    Social History   Socioeconomic History   Marital status: Single    Spouse name: Not on file   Number of children: Not on file   Years of education: Not on file   Highest education level: Bachelor's degree (e.g., BA, AB, BS)  Occupational History   Occupation: Rad Theme park manager: [employer]  Tobacco Use   Smoking status: Never   Smokeless tobacco: Never  Vaping Use   Vaping status: Never Used  Substance and Sexual Activity   Alcohol use: Yes    Comment: occasional   Drug use: Never   Sexual activity: Yes    Birth control/protection: Pill  Other Topics Concern   Not on file  Social History Narrative   Not on file   Social Drivers of Health   Financial Resource Strain: Low Risk  (04/15/2023)   Overall Financial Resource Strain (CARDIA)    Difficulty of Paying Living Expenses: Not very hard   Food Insecurity: No Food Insecurity (04/15/2023)   Hunger Vital Sign    Worried About Running Out of Food in the Last Year: Never true    Ran Out of Food in the Last Year: Never true  Transportation Needs: No Transportation Needs (04/15/2023)   PRAPARE - Administrator, Civil Service (Medical): No    Lack of Transportation (Non-Medical): No  Physical Activity: Insufficiently Active (04/15/2023)   Exercise Vital Sign    Days of Exercise per Week: 2 days    Minutes of Exercise per Session: 10 min  Stress: Stress Concern Present (04/15/2023)   [employer] of Occupational Health - Occupational Stress Questionnaire    Feeling of Stress : Rather much  Social Connections: Unknown (04/15/2023)   Social Connection and Isolation Panel [NHANES]    Frequency of Communication with Friends and Family: Twice a week    Frequency of Social Gatherings with Friends and Family: Once a week    Attends Religious Services: Never    Database administrator or Organizations: No    Attends Engineer, structural: Not on file    Marital Status: Patient declined    Family History  Problem Relation Age of Onset   Anxiety disorder Mother  Diabetes Father    Obesity Father     Health Maintenance  Topic Date Due   Cervical Cancer Screening (HPV/Pap Cotest)  01/25/2023   DTaP/Tdap/Td (4 - Td or Tdap) 04/01/2032   INFLUENZA VACCINE  Completed   HPV VACCINES  Completed   COVID-19 Vaccine  Completed   Hepatitis C Screening  Completed   HIV Screening  Completed     ----------------------------------------------------------------------------------------------------------------------------------------------------------------------------------------------------------------- Physical Exam BP (!) 147/89 (BP Location: Left Arm, Patient Position: Sitting, Cuff Size: Normal)   Pulse 82   Temp 98.4 F (36.9 C) (Oral)   Ht 5\' 4"  (1.626 m)   Wt 205 lb (93 kg)   SpO2 98%   BMI 35.19  kg/m   Physical Exam Constitutional:      Appearance: Normal appearance.  HENT:     Head: Normocephalic and atraumatic.  Eyes:     General: No scleral icterus. Cardiovascular:     Rate and Rhythm: Normal rate and regular rhythm.  Pulmonary:     Effort: Pulmonary effort is normal.     Breath sounds: Normal breath sounds.  Neurological:     Mental Status: She is alert.  Psychiatric:        Mood and Affect: Mood normal.        Behavior: Behavior normal.     ------------------------------------------------------------------------------------------------------------------------------------------------------------------------------------------------------------------- Assessment and Plan  GAD (generalized anxiety disorder) Fairly stable with combination of sertraline and rare use of alprazolam for severe anxiety.  Stress is overall better since moving to traveling  Migraine headache Continue topiramate for migraine prophylaxis.  Imitrex as needed for breakthrough.  Obesity (BMI 30.0-34.9) She did well with Wegovy previously.  Adding this back on.     Meds ordered this encounter  Medications   Semaglutide-Weight Management 0.25 MG/0.5ML SOAJ    Sig: Inject 0.25 mg into the skin once a week for 28 days.    Dispense:  2 mL    Refill:  0   Semaglutide-Weight Management 0.5 MG/0.5ML SOAJ    Sig: Inject 0.5 mg into the skin once a week for 28 days.    Dispense:  2 mL    Refill:  0   Semaglutide-Weight Management 1 MG/0.5ML SOAJ    Sig: Inject 1 mg into the skin once a week for 28 days.    Dispense:  2 mL    Refill:  3   ALPRAZolam (XANAX) 0.25 MG tablet    Sig: Take 1 tablet (0.25 mg total) by mouth at bedtime as needed for anxiety.    Dispense:  30 tablet    Refill:  1   sertraline (ZOLOFT) 100 MG tablet    Sig: Take 1&1/2 tablets (150 mg total) by mouth daily.    Dispense:  135 tablet    Refill:  3   SUMAtriptan (IMITREX) 25 MG tablet    Sig: Take 1 tablet (25 mg  total) by mouth every 2 (two) hours as needed for migraine. May repeat in 2 hours if headache persists or recurs.    Dispense:  10 tablet    Refill:  6   topiramate (TOPAMAX) 100 MG tablet    Sig: Take 1 tablet (100 mg total) by mouth at bedtime.    Dispense:  90 tablet    Refill:  3    No follow-ups on file.    This visit occurred during the SARS-CoV-2 public health emergency.  Safety protocols were in place, including screening questions prior to the visit, additional usage of staff PPE, and  extensive cleaning of exam room while observing appropriate contact time as indicated for disinfecting solutions.

## 2023-04-17 NOTE — Assessment & Plan Note (Signed)
Continue topiramate for migraine prophylaxis.  Imitrex as needed for breakthrough.

## 2023-04-18 ENCOUNTER — Encounter: Payer: Self-pay | Admitting: Family Medicine

## 2023-04-18 MED ORDER — SAXENDA 18 MG/3ML ~~LOC~~ SOPN: PEN_INJECTOR | SUBCUTANEOUS | 3 refills | Status: DC

## 2023-04-27 MED ORDER — SAXENDA 18 MG/3ML ~~LOC~~ SOPN: PEN_INJECTOR | SUBCUTANEOUS | 1 refills | Status: DC

## 2023-04-27 NOTE — Addendum Note (Signed)
Addended by: Mammie Lorenzo on: 04/27/2023 02:26 PM   Modules accepted: Orders

## 2023-05-12 ENCOUNTER — Other Ambulatory Visit: Payer: Self-pay | Admitting: Obstetrics and Gynecology

## 2023-05-12 DIAGNOSIS — Z3009 Encounter for other general counseling and advice on contraception: Secondary | ICD-10-CM

## 2023-07-04 ENCOUNTER — Telehealth: Payer: Self-pay | Admitting: *Deleted

## 2023-07-04 NOTE — Telephone Encounter (Signed)
 Left patient a message to call and schedule annual for April per her request.

## 2023-07-21 ENCOUNTER — Other Ambulatory Visit (HOSPITAL_COMMUNITY): Payer: Self-pay

## 2023-07-21 ENCOUNTER — Other Ambulatory Visit: Payer: Self-pay | Admitting: Obstetrics and Gynecology

## 2023-07-21 DIAGNOSIS — Z3009 Encounter for other general counseling and advice on contraception: Secondary | ICD-10-CM

## 2023-08-09 ENCOUNTER — Other Ambulatory Visit: Payer: Self-pay | Admitting: Family Medicine

## 2023-08-09 DIAGNOSIS — F411 Generalized anxiety disorder: Secondary | ICD-10-CM

## 2023-08-15 ENCOUNTER — Other Ambulatory Visit (HOSPITAL_COMMUNITY): Payer: Self-pay

## 2024-01-02 ENCOUNTER — Encounter: Payer: Self-pay | Admitting: Sports Medicine

## 2024-01-04 ENCOUNTER — Encounter: Payer: Self-pay | Admitting: Family Medicine

## 2024-01-04 DIAGNOSIS — F411 Generalized anxiety disorder: Secondary | ICD-10-CM

## 2024-01-04 DIAGNOSIS — G43709 Chronic migraine without aura, not intractable, without status migrainosus: Secondary | ICD-10-CM

## 2024-01-04 MED ORDER — TOPIRAMATE 100 MG PO TABS: 100.0000 mg | ORAL_TABLET | Freq: Every day | ORAL | 0 refills | Status: DC

## 2024-01-04 MED ORDER — SERTRALINE HCL 100 MG PO TABS: 150.0000 mg | ORAL_TABLET | Freq: Every day | ORAL | 0 refills | Status: DC

## 2024-01-04 NOTE — Telephone Encounter (Signed)
 [facility] Sent a 7 day supply of medication until patient upcoming appt scheduled for 01/11/2024

## 2024-01-11 ENCOUNTER — Ambulatory Visit: Admitting: Family Medicine

## 2024-01-16 ENCOUNTER — Ambulatory Visit: Admitting: Family Medicine

## 2024-01-16 ENCOUNTER — Other Ambulatory Visit: Payer: Self-pay | Admitting: Family Medicine

## 2024-01-16 VITALS — BP 127/78 | HR 90 | Ht 64.0 in | Wt 215.0 lb

## 2024-01-16 DIAGNOSIS — E669 Obesity, unspecified: Secondary | ICD-10-CM

## 2024-01-16 DIAGNOSIS — Z23 Encounter for immunization: Secondary | ICD-10-CM | POA: Diagnosis not present

## 2024-01-16 DIAGNOSIS — G43709 Chronic migraine without aura, not intractable, without status migrainosus: Secondary | ICD-10-CM | POA: Diagnosis not present

## 2024-01-16 DIAGNOSIS — F411 Generalized anxiety disorder: Secondary | ICD-10-CM

## 2024-01-16 DIAGNOSIS — Z3040 Encounter for surveillance of contraceptives, unspecified: Secondary | ICD-10-CM | POA: Diagnosis not present

## 2024-01-16 DIAGNOSIS — Z3009 Encounter for other general counseling and advice on contraception: Secondary | ICD-10-CM

## 2024-01-16 LAB — POCT PREGNANCY, URINE: Pregnancy Test, Urine: NEGATIVE

## 2024-01-16 MED ORDER — SEMAGLUTIDE-WEIGHT MANAGEMENT 0.5 MG/0.5ML ~~LOC~~ SOAJ
0.5000 mg | SUBCUTANEOUS | 0 refills | Status: AC
Start: 2024-02-14 — End: 2024-03-13

## 2024-01-16 MED ORDER — ZEPBOUND 5 MG/0.5ML ~~LOC~~ SOAJ: 5.0000 mg | SUBCUTANEOUS | 0 refills | Status: DC

## 2024-01-16 MED ORDER — ZEPBOUND 2.5 MG/0.5ML ~~LOC~~ SOAJ: 2.5000 mg | SUBCUTANEOUS | 0 refills | Status: DC

## 2024-01-16 MED ORDER — NORETHINDRONE 0.35 MG PO TABS: 1.0000 | ORAL_TABLET | Freq: Every day | ORAL | 4 refills | Status: AC

## 2024-01-16 MED ORDER — SEMAGLUTIDE-WEIGHT MANAGEMENT 1 MG/0.5ML ~~LOC~~ SOAJ: 1.0000 mg | SUBCUTANEOUS | 0 refills | Status: AC

## 2024-01-16 MED ORDER — SEMAGLUTIDE-WEIGHT MANAGEMENT 0.25 MG/0.5ML ~~LOC~~ SOAJ: 0.2500 mg | SUBCUTANEOUS | 0 refills | Status: AC

## 2024-01-16 MED ORDER — SERTRALINE HCL 100 MG PO TABS
150.0000 mg | ORAL_TABLET | Freq: Every day | ORAL | 2 refills | Status: AC
Start: 2024-01-16 — End: ?

## 2024-01-16 MED ORDER — TOPIRAMATE 100 MG PO TABS: 100.0000 mg | ORAL_TABLET | Freq: Every day | ORAL | 2 refills | Status: AC

## 2024-01-16 NOTE — Addendum Note (Signed)
 Addended by: Akanksha Bellmore E on: 01/16/2024 03:46 PM   Modules accepted: Orders

## 2024-01-16 NOTE — Progress Notes (Signed)
 Alison Davies - 38 y.o. female MRN [id number]  Date of birth: 18-Mar-1986  Subjective Chief Complaint  Patient presents with   Mood   Weight Loss    HPI Alison Davies is a 38 y.o. female here today for follow up visit.   She reports that she is doing ok. .   She continues on sertraline  for management of anxiety.  Rare use of alprazolam .   She states that she felt that she was in a dark place recently after running out of sertraline .  She was having intrusive thoughts and suicidal ideation.  These have eased since restarting sertraline .     Migraines are stable with topiramate  daily and imitrex  prn.  She is doing pretty well without side effects at this time.  She would like to restart medicaiton for weight loss.  She used Wegovy  in the past and did well with this.  She did not have significant side effects from this in the past.  She is not very active and feels that she could be more diligent about dietary changes.   ROS:  A comprehensive ROS was completed and negative except as noted per HPI  No Known Allergies  Past Medical History:  Diagnosis Date   Anxiety    Hyperlipidemia    Leg edema    Migraines    Vaginal Pap smear, abnormal 06/12/2019   LGSIL +HPV    Past Surgical History:  Procedure Laterality Date   COLPOSCOPY N/A    CRYOTHERAPY  12/2020   cervix    Social History   Socioeconomic History   Marital status: Single    Spouse name: Not on file   Number of children: Not on file   Years of education: Not on file   Highest education level: Bachelor's degree (e.g., BA, AB, BS)  Occupational History   Occupation: Rad Theme park manager: [employer]  Tobacco Use   Smoking status: Never   Smokeless tobacco: Never  Vaping Use   Vaping status: Never Used  Substance and Sexual Activity   Alcohol use: Yes    Comment: occasional   Drug use: Never   Sexual activity: Yes    Birth control/protection: Pill  Other Topics Concern   Not on file  Social History  Narrative   Not on file   Social Drivers of Health   Financial Resource Strain: Low Risk  (01/16/2024)   Overall Financial Resource Strain (CARDIA)    Difficulty of Paying Living Expenses: Not hard at all  Food Insecurity: No Food Insecurity (01/16/2024)   Hunger Vital Sign    Worried About Running Out of Food in the Last Year: Never true    Ran Out of Food in the Last Year: Never true  Transportation Needs: No Transportation Needs (01/16/2024)   PRAPARE - Administrator, Civil Service (Medical): No    Lack of Transportation (Non-Medical): No  Physical Activity: Inactive (01/16/2024)   Exercise Vital Sign    Days of Exercise per Week: 0 days    Minutes of Exercise per Session: Not on file  Stress: Stress Concern Present (01/16/2024)   [employer] of Occupational Health - Occupational Stress Questionnaire    Feeling of Stress: Rather much  Social Connections: Socially Isolated (01/16/2024)   Social Connection and Isolation Panel    Frequency of Communication with Friends and Family: Once a week    Frequency of Social Gatherings with Friends and Family: Once a week    Attends  Religious Services: Never    Active Member of Clubs or Organizations: No    Attends Engineer, structural: Not on file    Marital Status: Never married    Family History  Problem Relation Age of Onset   Anxiety disorder Mother    Diabetes Father    Obesity Father     Health Maintenance  Topic Date Due   Cervical Cancer Screening (HPV/Pap Cotest)  01/25/2023   Influenza Vaccine  12/01/2023   DTaP/Tdap/Td (4 - Td or Tdap) 04/01/2032   Hepatitis B Vaccines 19-59 Average Risk  Completed   HPV VACCINES  Completed   COVID-19 Vaccine  Completed   Hepatitis C Screening  Completed   HIV Screening  Completed   Pneumococcal Vaccine  Aged Out   Meningococcal B Vaccine  Aged Out      ----------------------------------------------------------------------------------------------------------------------------------------------------------------------------------------------------------------- Physical Exam BP 131/88 (BP Location: Left Arm, Patient Position: Sitting, Cuff Size: Large)   Pulse 90   Ht 5' 4 (1.626 m)   Wt 215 lb (97.5 kg)   SpO2 97%   BMI 36.90 kg/m   Physical Exam Constitutional:      Appearance: Normal appearance.  Eyes:     General: No scleral icterus. Cardiovascular:     Rate and Rhythm: Normal rate and regular rhythm.  Pulmonary:     Effort: Pulmonary effort is normal.     Breath sounds: Normal breath sounds.  Neurological:     Mental Status: She is alert.  Psychiatric:        Mood and Affect: Mood normal.        Behavior: Behavior normal.     ------------------------------------------------------------------------------------------------------------------------------------------------------------------------------------------------------------------- Assessment and Plan  GAD (generalized anxiety disorder) She is doing better since restarting sertraline .  Reviewed PHQ9 responses and she denies SI at this time.  Discussed that if she is experiencing new or worsening symptoms to please reach out or contact 911.   Obesity (BMI 35.0-39.9 without comorbidity) She has done well with GLP-1 in the past.  She has no contraindications to use of Zepbound  or Wegovy .  She will follow a physician supervised diet and exercise plan while taking medication.    Migraine headache Continue topiramate  for migraine prophylaxis.  Imitrex  as needed for breakthrough.   Meds ordered this encounter  Medications   norethindrone  (MICRONOR ) 0.35 MG tablet    Sig: Take 1 tablet (0.35 mg total) by mouth daily.    Dispense:  84 tablet    Refill:  4   sertraline  (ZOLOFT ) 100 MG tablet    Sig: Take 1.5 tablets (150 mg total) by mouth daily.    Dispense:   135 tablet    Refill:  2   topiramate  (TOPAMAX ) 100 MG tablet    Sig: Take 1 tablet (100 mg total) by mouth at bedtime.    Dispense:  90 tablet    Refill:  2   tirzepatide  (ZEPBOUND ) 2.5 MG/0.5ML Pen    Sig: Inject 2.5 mg into the skin once a week.    Dispense:  2 mL    Refill:  0   tirzepatide  (ZEPBOUND ) 5 MG/0.5ML Pen    Sig: Inject 5 mg into the skin once a week.    Dispense:  2 mL    Refill:  0    No follow-ups on file.

## 2024-01-16 NOTE — Assessment & Plan Note (Signed)
 She is doing better since restarting sertraline .  Reviewed PHQ9 responses and she denies SI at this time.  Discussed that if she is experiencing new or worsening symptoms to please reach out or contact 911.

## 2024-01-16 NOTE — Assessment & Plan Note (Signed)
 She has done well with GLP-1 in the past.  She has no contraindications to use of Zepbound  or Wegovy .  She will follow a physician supervised diet and exercise plan while taking medication.

## 2024-01-16 NOTE — Assessment & Plan Note (Signed)
Continue topiramate for migraine prophylaxis.  Imitrex as needed for breakthrough.

## 2024-02-12 ENCOUNTER — Ambulatory Visit: Admitting: Family Medicine

## 2024-02-12 VITALS — BP 135/84 | HR 85 | Ht 64.0 in | Wt 217.0 lb

## 2024-02-12 DIAGNOSIS — Z6835 Body mass index (BMI) 35.0-35.9, adult: Secondary | ICD-10-CM

## 2024-02-12 DIAGNOSIS — K2900 Acute gastritis without bleeding: Secondary | ICD-10-CM

## 2024-02-12 DIAGNOSIS — K2970 Gastritis, unspecified, without bleeding: Secondary | ICD-10-CM | POA: Insufficient documentation

## 2024-02-12 DIAGNOSIS — F411 Generalized anxiety disorder: Secondary | ICD-10-CM

## 2024-02-12 DIAGNOSIS — Z23 Encounter for immunization: Secondary | ICD-10-CM

## 2024-02-12 DIAGNOSIS — E66812 Obesity, class 2: Secondary | ICD-10-CM | POA: Diagnosis not present

## 2024-02-12 MED ORDER — PANTOPRAZOLE SODIUM 40 MG PO TBEC: DELAYED_RELEASE_TABLET | ORAL | 3 refills | Status: AC

## 2024-02-12 MED ORDER — SUCRALFATE 1 G PO TABS: 1.0000 g | ORAL_TABLET | Freq: Four times a day (QID) | ORAL | 0 refills | Status: AC

## 2024-02-12 NOTE — Assessment & Plan Note (Signed)
 She is doing better since restarting sertraline .  Discussed that if she is experiencing new or worsening symptoms to please reach out or contact 911.

## 2024-02-12 NOTE — Patient Instructions (Signed)
 Avoid ibuprofen , aleve, goody/bc powders Stat pantoprazole 40mg  twice daily  x2 weeks then take once daily

## 2024-02-12 NOTE — Progress Notes (Signed)
 Alison Davies - 38 y.o. female MRN [id number]  Date of birth: 08/24/1985  Subjective Chief Complaint  Patient presents with   Mood    HPI Alison Davies is a 38 y.o. female here today for follow up visit.  She reports he is doing pretty well.  Feels that restarting sertraline  has been good for her.  She does feel like this has been helpful for management of her anxiety.  No side effects from medication and current strength.  She thinks she may have an ulcer or gastritis.  She has had some epigastric pain and burning sensation.  This does improve with eating.  She does have some occasional nausea.  She has not noticed any dark or bloody stools.  She did purchase omeprazole over-the-counter.  She has been using ibuprofen  fairly frequently due to some generalized pain from lifting patients.    ROS:  A comprehensive ROS was completed and negative except as noted per HPI   No Known Allergies  Past Medical History:  Diagnosis Date   Anxiety    Hyperlipidemia    Leg edema    Migraines    Vaginal Pap smear, abnormal 06/12/2019   LGSIL +HPV    Past Surgical History:  Procedure Laterality Date   COLPOSCOPY N/A    CRYOTHERAPY  12/2020   cervix    Social History   Socioeconomic History   Marital status: Single    Spouse name: Not on file   Number of children: Not on file   Years of education: Not on file   Highest education level: Bachelor's degree (e.g., BA, AB, BS)  Occupational History   Occupation: Rad Theme park manager: [employer]  Tobacco Use   Smoking status: Never   Smokeless tobacco: Never  Vaping Use   Vaping status: Never Used  Substance and Sexual Activity   Alcohol use: Yes    Comment: occasional   Drug use: Never   Sexual activity: Yes    Birth control/protection: Pill  Other Topics Concern   Not on file  Social History Narrative   Not on file   Social Drivers of Health   Financial Resource Strain: Low Risk  (02/12/2024)   Overall Financial  Resource Strain (CARDIA)    Difficulty of Paying Living Expenses: Not very hard  Food Insecurity: No Food Insecurity (02/12/2024)   Hunger Vital Sign    Worried About Running Out of Food in the Last Year: Never true    Ran Out of Food in the Last Year: Never true  Transportation Needs: No Transportation Needs (02/12/2024)   PRAPARE - Administrator, Civil Service (Medical): No    Lack of Transportation (Non-Medical): No  Physical Activity: Inactive (02/12/2024)   Exercise Vital Sign    Days of Exercise per Week: 0 days    Minutes of Exercise per Session: Not on file  Stress: Stress Concern Present (02/12/2024)   [employer] of Occupational Health - Occupational Stress Questionnaire    Feeling of Stress: Very much  Social Connections: Socially Isolated (02/12/2024)   Social Connection and Isolation Panel    Frequency of Communication with Friends and Family: Twice a week    Frequency of Social Gatherings with Friends and Family: Once a week    Attends Religious Services: Patient declined    Database administrator or Organizations: No    Attends Engineer, structural: Not on file    Marital Status: Never married  Family History  Problem Relation Age of Onset   Anxiety disorder Mother    Diabetes Father    Obesity Father     Health Maintenance  Topic Date Due   Cervical Cancer Screening (HPV/Pap Cotest)  01/25/2023   DTaP/Tdap/Td (4 - Td or Tdap) 04/01/2032   Influenza Vaccine  Completed   Hepatitis B Vaccines 19-59 Average Risk  Completed   HPV VACCINES  Completed   COVID-19 Vaccine  Completed   Hepatitis C Screening  Completed   HIV Screening  Completed   Pneumococcal Vaccine  Aged Out   Meningococcal B Vaccine  Aged Out     ----------------------------------------------------------------------------------------------------------------------------------------------------------------------------------------------------------------- Physical  Exam BP 135/84 (BP Location: Left Arm, Patient Position: Sitting, Cuff Size: Normal)   Pulse 85   Ht 5' 4 (1.626 m)   Wt 217 lb (98.4 kg)   SpO2 98%   BMI 37.25 kg/m   Physical Exam Constitutional:      Appearance: Normal appearance.  Eyes:     General: No scleral icterus. Abdominal:     General: There is no distension.     Palpations: Abdomen is soft.     Tenderness: There is no abdominal tenderness.  Neurological:     Mental Status: She is alert.  Psychiatric:        Mood and Affect: Mood normal.        Behavior: Behavior normal.     ------------------------------------------------------------------------------------------------------------------------------------------------------------------------------------------------------------------- Assessment and Plan  Gastritis Starting pantoprazole twice daily x 2 weeks then daily thereafter.  Carafate as needed.  Avoid NSAIDs.  Red flags reviewed.  GAD (generalized anxiety disorder) She is doing better since restarting sertraline .  Discussed that if she is experiencing new or worsening symptoms to please reach out or contact 911.    Meds ordered this encounter  Medications   pantoprazole (PROTONIX) 40 MG tablet    Sig: BID x2 weeks then daily.    Dispense:  45 tablet    Refill:  3   sucralfate (CARAFATE) 1 g tablet    Sig: Take 1 tablet (1 g total) by mouth 4 (four) times daily.    Dispense:  120 tablet    Refill:  0    Return in about 8 weeks (around 04/08/2024) for f/u GI upset-Virtual visit.

## 2024-02-12 NOTE — Assessment & Plan Note (Signed)
 Starting pantoprazole twice daily x 2 weeks then daily thereafter.  Carafate as needed.  Avoid NSAIDs.  Red flags reviewed.

## 2024-02-20 ENCOUNTER — Ambulatory Visit (INDEPENDENT_AMBULATORY_CARE_PROVIDER_SITE_OTHER): Admitting: Obstetrics and Gynecology

## 2024-02-20 ENCOUNTER — Encounter: Payer: Self-pay | Admitting: Obstetrics and Gynecology

## 2024-02-20 ENCOUNTER — Other Ambulatory Visit (HOSPITAL_COMMUNITY)
Admission: RE | Admit: 2024-02-20 | Discharge: 2024-02-20 | Disposition: A | Source: Ambulatory Visit | Attending: Obstetrics and Gynecology | Admitting: Obstetrics and Gynecology

## 2024-02-20 VITALS — BP 118/84 | HR 80 | Ht 64.0 in | Wt 214.1 lb

## 2024-02-20 DIAGNOSIS — Z01419 Encounter for gynecological examination (general) (routine) without abnormal findings: Secondary | ICD-10-CM | POA: Diagnosis present

## 2024-02-20 DIAGNOSIS — Z113 Encounter for screening for infections with a predominantly sexual mode of transmission: Secondary | ICD-10-CM | POA: Diagnosis not present

## 2024-02-20 NOTE — Progress Notes (Signed)
 GYNECOLOGY ANNUAL PREVENTATIVE CARE ENCOUNTER NOTE  History:     Alison Davies is a 38 y.o. G0P0000 female here for a routine annual gynecologic exam.  Current complaints: None.   Denies abnormal vaginal bleeding, discharge, pelvic pain, problems with intercourse or other gynecologic concerns.   Status post LEEP on 07/25/22 with Dr. Cris, results showed CIN 1 extending to ectocervical margins, recommended repeat pap with HPV testing in 6 months. Did not follow up for 6 month pap.   Up to date on Gardacil vaccine.  Would like to conceive.    Gynecologic History Patient's last menstrual period was 11/21/2023 (approximate). Contraception: none   Obstetric History OB History  Gravida Para Term Preterm AB Living  0 0 0 0 0 0  SAB IAB Ectopic Multiple Live Births  0 0 0 0 0    Past Medical History:  Diagnosis Date   Anxiety    Hyperlipidemia    Leg edema    Migraines    Vaginal Pap smear, abnormal 06/12/2019   LGSIL +HPV    Past Surgical History:  Procedure Laterality Date   COLPOSCOPY N/A    CRYOTHERAPY  12/2020   cervix    Current Outpatient Medications on File Prior to Visit  Medication Sig Dispense Refill   ALPRAZolam  (XANAX ) 0.25 MG tablet Take 1 tablet (0.25 mg total) by mouth at bedtime as needed for anxiety. 30 tablet 1   Biotin 3 MG TABS Take 2 tablets by mouth daily.     Cyanocobalamin (VITAMIN B-12 CR) 1500 MCG TBCR Take 2 tablets by mouth 2 (two) times daily.     diclofenac  Sodium (VOLTAREN ) 1 % GEL Apply 4 g topically 4 (four) times daily. Apply to affected areas 4 times daily as needed for pain. 100 g 2   Melatonin 5 MG CHEW Chew 2 capsules by mouth at bedtime as needed.     Multiple Vitamin (MULTIVITAMIN WITH MINERALS) TABS tablet Take 1 tablet by mouth daily.     norethindrone  (MICRONOR ) 0.35 MG tablet Take 1 tablet (0.35 mg total) by mouth daily. 84 tablet 4   ondansetron  (ZOFRAN -ODT) 4 MG disintegrating tablet Take 1 tablet (4 mg total) by mouth  every 8 (eight) hours as needed for nausea or vomiting. 20 tablet 0   pantoprazole (PROTONIX) 40 MG tablet BID x2 weeks then daily. 45 tablet 3   semaglutide -weight management (WEGOVY ) 0.5 MG/0.5ML SOAJ SQ injection Inject 0.5 mg into the skin once a week for 28 days. 2 mL 0   [START ON 03/14/2024] semaglutide -weight management (WEGOVY ) 1 MG/0.5ML SOAJ SQ injection Inject 1 mg into the skin once a week for 28 days. 2 mL 0   sertraline  (ZOLOFT ) 100 MG tablet Take 1.5 tablets (150 mg total) by mouth daily. 135 tablet 2   sucralfate (CARAFATE) 1 g tablet Take 1 tablet (1 g total) by mouth 4 (four) times daily. 120 tablet 0   SUMAtriptan  (IMITREX ) 25 MG tablet Take 1 tablet (25 mg total) by mouth every 2 (two) hours as needed for migraine. May repeat in 2 hours if headache persists or recurs. 10 tablet 6   topiramate  (TOPAMAX ) 100 MG tablet Take 1 tablet (100 mg total) by mouth at bedtime. 90 tablet 2   Turmeric (QC TUMERIC COMPLEX PO) Take by mouth.     vitamin C (ASCORBIC ACID) 250 MG tablet Take 250 mg by mouth daily.     influenza vac split quadrivalent PF (FLUARIX  QUADRIVALENT) 0.5 ML injection Inject into  the muscle. (Patient not taking: Reported on 02/20/2024) 0.5 mL 0   No current facility-administered medications on file prior to visit.    No Known Allergies  Social History:  reports that she has never smoked. She has never used smokeless tobacco. She reports that she does not currently use alcohol. She reports that she does not use drugs.  Family History  Problem Relation Age of Onset   Anxiety disorder Mother    Diabetes Father    Obesity Father     The following portions of the patient's history were reviewed and updated as appropriate: allergies, current medications, past family history, past medical history, past social history, past surgical history and problem list.  Review of Systems Pertinent items noted in HPI and remainder of comprehensive ROS otherwise  negative.  Physical Exam:  BP 118/84   Pulse 80   Ht 5' 4 (1.626 m)   Wt 214 lb 1.9 oz (97.1 kg)   LMP 11/21/2023 (Approximate) Comment: irregular  BMI 36.75 kg/m  CONSTITUTIONAL: Well-developed, well-nourished female in no acute distress.  HENT:  Normocephalic, atraumatic, External right and left ear normal.  EYES: Conjunctivae and EOM are normal. Pupils are equal, round, and reactive to light. No scleral icterus.  NECK: Normal range of motion, supple, no masses.  Normal thyroid .  SKIN: Skin is warm and dry. No rash noted. Not diaphoretic. No erythema. No pallor. MUSCULOSKELETAL: Normal range of motion. No tenderness.  No cyanosis, clubbing, or edema. NEUROLOGIC: Alert and oriented to person, place, and time. Normal reflexes, muscle tone coordination.  PSYCHIATRIC: Normal mood and affect. Normal behavior. Normal judgment and thought content. CARDIOVASCULAR: Normal heart rate noted, regular rhythm RESPIRATORY: Clear to auscultation bilaterally. Effort and breath sounds normal, no problems with respiration noted. BREASTS: Symmetric in size. No masses, tenderness, skin changes, nipple drainage, or lymphadenopathy bilaterally. Performed in the presence of a chaperone. ABDOMEN: Soft, no distention noted.  No tenderness, rebound or guarding.  PELVIC: Normal appearing external genitalia and urethral meatus; normal appearing vaginal mucosa and cervix.  No abnormal vaginal discharge noted.  Pap smear obtained.  Normal uterine size, no other palpable masses, no uterine or adnexal tenderness.  Performed in the presence of a chaperone.   Assessment and Plan:   1. Women's annual routine gynecological examination (Primary)  - Cytology - PAP - RPR+HBsAg+HCVAb+... - Discussed REI again for infertility w/u  2. Screening examination for STD (sexually transmitted disease)  - RPR+HBsAg+HCVAb+...    Will follow up results of pap smear and manage accordingly. Normal breast examination today, she  was advised to perform periodic self breast examinations.  Routine preventative health maintenance measures emphasized. Please refer to After Visit Summary for other counseling recommendations.     Jaisa Defino, Delon FERNS, NP [facility] [employer], [facility]

## 2024-02-27 LAB — CYTOLOGY - PAP
Chlamydia: NEGATIVE
Comment: NEGATIVE
Comment: NEGATIVE
Comment: NEGATIVE
Comment: NORMAL
Diagnosis: UNDETERMINED — AB
High risk HPV: NEGATIVE
Neisseria Gonorrhea: NEGATIVE
Trichomonas: NEGATIVE

## 2024-02-29 ENCOUNTER — Encounter: Payer: Self-pay | Admitting: Obstetrics and Gynecology

## 2024-02-29 ENCOUNTER — Ambulatory Visit: Payer: Self-pay | Admitting: Obstetrics and Gynecology

## 2024-02-29 DIAGNOSIS — R87610 Atypical squamous cells of undetermined significance on cytologic smear of cervix (ASC-US): Secondary | ICD-10-CM | POA: Insufficient documentation

## 2024-04-08 ENCOUNTER — Ambulatory Visit: Admitting: Family Medicine

## 2024-04-10 ENCOUNTER — Encounter: Payer: Self-pay | Admitting: Family Medicine

## 2024-04-10 ENCOUNTER — Telehealth: Admitting: Family Medicine

## 2024-04-10 DIAGNOSIS — F320 Major depressive disorder, single episode, mild: Secondary | ICD-10-CM | POA: Diagnosis not present

## 2024-04-10 DIAGNOSIS — F329 Major depressive disorder, single episode, unspecified: Secondary | ICD-10-CM | POA: Insufficient documentation

## 2024-04-10 DIAGNOSIS — K2900 Acute gastritis without bleeding: Secondary | ICD-10-CM

## 2024-04-10 MED ORDER — CARIPRAZINE HCL 1.5 MG PO CAPS: 1.5000 mg | ORAL_CAPSULE | ORAL | 1 refills | Status: AC

## 2024-04-10 NOTE — Assessment & Plan Note (Signed)
 Continue sertraline  at current strength.  Adding vraylar for augmentation.  Reviewed side effects, she will let me know if having any of these.

## 2024-04-10 NOTE — Assessment & Plan Note (Signed)
 Recommend increase in omeprazole to BID dosing.  Continue to avoid NSAIDs.

## 2024-04-10 NOTE — Progress Notes (Signed)
 Alison Davies - 38 y.o. female MRN [id number]  Date of birth: 06/18/1985   All issues noted in this document were discussed and addressed.  No physical exam was performed (except for noted visual exam findings with Video Visits).  I discussed the limitations of evaluation and management by telemedicine and the availability of in person appointments. The patient expressed understanding and agreed to proceed.  I connected withNAME@ on 04/10/24 at 11:10 AM EST by a video enabled telemedicine application and verified that I am speaking with the correct person using two identifiers.  Present at visit: Velma Ku, DO Alison Davies   Patient Location: Home [address] [place] [address]   Provider location:   [facility]  No chief complaint on file.   HPI  Alison Davies is a 38 y.o. female who presents via audio/video conferencing for a telehealth visit today.  She reports that GI symptoms have improved but have not fully resolved.  She never picked up protonix  but is using OTC omeprazole.  She has cut back on ibuprofen  use.   She is also having increase in depressive symptoms.  Currently on sertraline  150mg  daily.  She has never tried anything for augmentation. Anxiety is stable overall.  Denies SI at this time.    ROS:  A comprehensive ROS was completed and negative except as noted per HPI  Past Medical History:  Diagnosis Date   Anxiety    Hyperlipidemia    Leg edema    Migraines    Vaginal Pap smear, abnormal 06/12/2019   LGSIL +HPV    Past Surgical History:  Procedure Laterality Date   COLPOSCOPY N/A    CRYOTHERAPY  12/2020   cervix    Family History  Problem Relation Age of Onset   Anxiety disorder Mother    Diabetes Father    Obesity Father     Social History   Socioeconomic History   Marital status: Single    Spouse name: Not on file   Number of children: Not on file   Years of education: Not on file   Highest education level: Bachelor's degree  (e.g., BA, AB, BS)  Occupational History   Occupation: [employer]: [employer]  Tobacco Use   Smoking status: Never   Smokeless tobacco: Never  Vaping Use   Vaping status: Every Day   Substances: Nicotine  Substance and Sexual Activity   Alcohol use: Not Currently    Comment: occasional   Drug use: Never   Sexual activity: Yes    Birth control/protection: Pill  Other Topics Concern   Not on file  Social History Narrative   Not on file   Social Drivers of Health   Financial Resource Strain: Low Risk  (02/12/2024)   Overall Financial Resource Strain (CARDIA)    Difficulty of Paying Living Expenses: Not very hard  Food Insecurity: No Food Insecurity (02/12/2024)   Hunger Vital Sign    Worried About Running Out of Food in the Last Year: Never true    Ran Out of Food in the Last Year: Never true  Transportation Needs: No Transportation Needs (02/12/2024)   PRAPARE - Administrator, Civil Service (Medical): No    Lack of Transportation (Non-Medical): No  Physical Activity: Inactive (02/12/2024)   Exercise Vital Sign    Days of Exercise per Week: 0 days    Minutes of Exercise per Session: Not on file  Stress: Stress Concern Present (02/12/2024)  [employer] of Occupational Health - Occupational Stress Questionnaire    Feeling of Stress: Very much  Social Connections: Socially Isolated (02/12/2024)   Social Connection and Isolation Panel    Frequency of Communication with Friends and Family: Twice a week    Frequency of Social Gatherings with Friends and Family: Once a week    Attends Religious Services: Patient declined    Database Administrator or Organizations: No    Attends Engineer, Structural: Not on file    Marital Status: Never married  Intimate Partner Violence: Unknown (01/11/2023)   Received from [facility]   Intimate Partner Violence    Fear of Current or Ex-Partner: Not on file    Emotionally Abused: Not on file     Physically Abused: Not on file    Sexually Abused: Not on file    Questions Apply to Other Individual: Not on file     Current Outpatient Medications:    cariprazine (VRAYLAR) 1.5 MG capsule, Take 1 capsule (1.5 mg total) by mouth daily., Disp: 30 capsule, Rfl: 1   ALPRAZolam  (XANAX ) 0.25 MG tablet, Take 1 tablet (0.25 mg total) by mouth at bedtime as needed for anxiety., Disp: 30 tablet, Rfl: 1   Biotin 3 MG TABS, Take 2 tablets by mouth daily., Disp: , Rfl:    Cyanocobalamin (VITAMIN B-12 CR) 1500 MCG TBCR, Take 2 tablets by mouth 2 (two) times daily., Disp: , Rfl:    diclofenac  Sodium (VOLTAREN ) 1 % GEL, Apply 4 g topically 4 (four) times daily. Apply to affected areas 4 times daily as needed for pain., Disp: 100 g, Rfl: 2   influenza vac split quadrivalent PF (FLUARIX  QUADRIVALENT) 0.5 ML injection, Inject into the muscle. (Patient not taking: Reported on 02/20/2024), Disp: 0.5 mL, Rfl: 0   Melatonin 5 MG CHEW, Chew 2 capsules by mouth at bedtime as needed., Disp: , Rfl:    Multiple Vitamin (MULTIVITAMIN WITH MINERALS) TABS tablet, Take 1 tablet by mouth daily., Disp: , Rfl:    norethindrone  (MICRONOR ) 0.35 MG tablet, Take 1 tablet (0.35 mg total) by mouth daily., Disp: 84 tablet, Rfl: 4   ondansetron  (ZOFRAN -ODT) 4 MG disintegrating tablet, Take 1 tablet (4 mg total) by mouth every 8 (eight) hours as needed for nausea or vomiting., Disp: 20 tablet, Rfl: 0   pantoprazole  (PROTONIX ) 40 MG tablet, BID x2 weeks then daily., Disp: 45 tablet, Rfl: 3   semaglutide -weight management (WEGOVY ) 1 MG/0.5ML SOAJ SQ injection, Inject 1 mg into the skin once a week for 28 days., Disp: 2 mL, Rfl: 0   sertraline  (ZOLOFT ) 100 MG tablet, Take 1.5 tablets (150 mg total) by mouth daily., Disp: 135 tablet, Rfl: 2   sucralfate  (CARAFATE ) 1 g tablet, Take 1 tablet (1 g total) by mouth 4 (four) times daily., Disp: 120 tablet, Rfl: 0   SUMAtriptan  (IMITREX ) 25 MG tablet, Take 1 tablet (25 mg total) by mouth  every 2 (two) hours as needed for migraine. May repeat in 2 hours if headache persists or recurs., Disp: 10 tablet, Rfl: 6   topiramate  (TOPAMAX ) 100 MG tablet, Take 1 tablet (100 mg total) by mouth at bedtime., Disp: 90 tablet, Rfl: 2   Turmeric (QC TUMERIC COMPLEX PO), Take by mouth., Disp: , Rfl:    vitamin C (ASCORBIC ACID) 250 MG tablet, Take 250 mg by mouth daily., Disp: , Rfl:   EXAM:  VITALS per patient if applicable: There were no vitals taken for this visit.  GENERAL: alert, oriented, appears well and in no acute distress  HEENT: atraumatic, conjunttiva clear, no obvious abnormalities on inspection of external nose and ears  NECK: normal movements of the head and neck  LUNGS: on inspection no signs of respiratory distress, breathing rate appears normal, no obvious gross SOB, gasping or wheezing  CV: no obvious cyanosis  MS: moves all visible extremities without noticeable abnormality  PSYCH/NEURO: pleasant and cooperative, no obvious depression or anxiety, speech and thought processing grossly intact  ASSESSMENT AND PLAN:  Discussed the following assessment and plan:  MDD (major depressive disorder) Continue sertraline  at current strength.  Adding vraylar for augmentation.  Reviewed side effects, she will let me know if having any of these.   Gastritis Recommend increase in omeprazole to BID dosing.  Continue to avoid NSAIDs.      I discussed the assessment and treatment plan with the patient. The patient was provided an opportunity to ask questions and all were answered. The patient agreed with the plan and demonstrated an understanding of the instructions.   The patient was advised to call back or seek an in-person evaluation if the symptoms worsen or if the condition fails to improve as anticipated.    Velma Ku, DO
